# Patient Record
Sex: Female | Born: 1955
Health system: Southern US, Community
[De-identification: ages and names within clinical notes are randomized; demographics above are authoritative.]

## PROBLEM LIST (undated history)

## (undated) DIAGNOSIS — R51 Headache: Secondary | ICD-10-CM

## (undated) DIAGNOSIS — K219 Gastro-esophageal reflux disease without esophagitis: Secondary | ICD-10-CM

## (undated) DIAGNOSIS — G43909 Migraine, unspecified, not intractable, without status migrainosus: Secondary | ICD-10-CM

## (undated) DIAGNOSIS — E119 Type 2 diabetes mellitus without complications: Secondary | ICD-10-CM

## (undated) DIAGNOSIS — G8929 Other chronic pain: Secondary | ICD-10-CM

## (undated) DIAGNOSIS — Z8679 Personal history of other diseases of the circulatory system: Secondary | ICD-10-CM

## (undated) DIAGNOSIS — R519 Headache, unspecified: Secondary | ICD-10-CM

## (undated) DIAGNOSIS — R011 Cardiac murmur, unspecified: Secondary | ICD-10-CM

## (undated) DIAGNOSIS — F329 Major depressive disorder, single episode, unspecified: Secondary | ICD-10-CM

## (undated) DIAGNOSIS — J309 Allergic rhinitis, unspecified: Secondary | ICD-10-CM

## (undated) DIAGNOSIS — K5792 Diverticulitis of intestine, part unspecified, without perforation or abscess without bleeding: Secondary | ICD-10-CM

## (undated) DIAGNOSIS — Z5189 Encounter for other specified aftercare: Secondary | ICD-10-CM

## (undated) DIAGNOSIS — IMO0001 Reserved for inherently not codable concepts without codable children: Secondary | ICD-10-CM

## (undated) DIAGNOSIS — F32A Depression, unspecified: Secondary | ICD-10-CM

## (undated) DIAGNOSIS — N921 Excessive and frequent menstruation with irregular cycle: Secondary | ICD-10-CM

## (undated) DIAGNOSIS — R7303 Prediabetes: Secondary | ICD-10-CM

## (undated) HISTORY — DX: Other chronic pain: G89.29

## (undated) HISTORY — DX: Allergic rhinitis, unspecified: J30.9

## (undated) HISTORY — PX: ESOPHAGOGASTRODUODENOSCOPY: SHX1529

## (undated) HISTORY — DX: Gastro-esophageal reflux disease without esophagitis: K21.9

## (undated) HISTORY — DX: Type 2 diabetes mellitus without complications: E11.9

## (undated) HISTORY — DX: Diverticulitis of intestine, part unspecified, without perforation or abscess without bleeding: K57.92

## (undated) HISTORY — DX: Major depressive disorder, single episode, unspecified: F32.9

## (undated) HISTORY — PX: COLONOSCOPY: SHX174

## (undated) HISTORY — PX: BREAST BIOPSY: SHX20

## (undated) HISTORY — DX: Depression, unspecified: F32.A

## (undated) HISTORY — PX: TONSILLECTOMY: SHX5217

## (undated) HISTORY — DX: Cardiac murmur, unspecified: R01.1

## (undated) HISTORY — DX: Excessive and frequent menstruation with irregular cycle: N92.1

## (undated) HISTORY — DX: Migraine, unspecified, not intractable, without status migrainosus: G43.909

## (undated) HISTORY — DX: Headache, unspecified: R51.9

## (undated) HISTORY — PX: DILATION AND CURETTAGE OF UTERUS: SHX78

## (undated) HISTORY — DX: Prediabetes: R73.03

## (undated) HISTORY — DX: Headache: R51

---

## 1984-07-24 HISTORY — PX: CERCLAGE REMOVAL: SUR1451

## 2010-11-21 ENCOUNTER — Encounter: Payer: Self-pay | Admitting: Internal Medicine

## 2010-11-21 ENCOUNTER — Ambulatory Visit (INDEPENDENT_AMBULATORY_CARE_PROVIDER_SITE_OTHER): Payer: 59 | Admitting: Internal Medicine

## 2010-11-21 DIAGNOSIS — K219 Gastro-esophageal reflux disease without esophagitis: Secondary | ICD-10-CM

## 2010-11-21 DIAGNOSIS — F3289 Other specified depressive episodes: Secondary | ICD-10-CM

## 2010-11-21 DIAGNOSIS — F32A Depression, unspecified: Secondary | ICD-10-CM | POA: Insufficient documentation

## 2010-11-21 DIAGNOSIS — F329 Major depressive disorder, single episode, unspecified: Secondary | ICD-10-CM | POA: Insufficient documentation

## 2010-11-21 DIAGNOSIS — N920 Excessive and frequent menstruation with regular cycle: Secondary | ICD-10-CM

## 2010-11-21 DIAGNOSIS — L301 Dyshidrosis [pompholyx]: Secondary | ICD-10-CM | POA: Insufficient documentation

## 2010-11-21 DIAGNOSIS — N921 Excessive and frequent menstruation with irregular cycle: Secondary | ICD-10-CM

## 2010-11-21 HISTORY — DX: Excessive and frequent menstruation with irregular cycle: N92.1

## 2010-11-21 MED ORDER — PANTOPRAZOLE SODIUM 40 MG PO TBEC: 40.0000 mg | DELAYED_RELEASE_TABLET | Freq: Every day | ORAL | Status: DC

## 2010-11-21 MED ORDER — DULOXETINE HCL 60 MG PO CPEP: 60.0000 mg | ORAL_CAPSULE | Freq: Every day | ORAL | Status: DC

## 2010-11-21 NOTE — Assessment & Plan Note (Signed)
Mild symptoms - advised OTC steroid cream, to call if rx desired or needed Educated on dx and tx today

## 2010-11-21 NOTE — Assessment & Plan Note (Signed)
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

## 2010-11-21 NOTE — Assessment & Plan Note (Signed)
Well controlled on daily PPI - cont same - refill done Reports EGD and colo 2009 - will send for records to have on file

## 2010-11-21 NOTE — Assessment & Plan Note (Signed)
Well controlled since 2009 on current therapy (cymbalta) - also help control Mskel arthritis pains The current medical regimen is effective;  continue present plan and medications.

## 2010-11-21 NOTE — Progress Notes (Signed)
  Subjective:    Patient ID: Sarah Mullen, female    DOB: April 06, 1956, 55 y.o.   MRN: [id number]  HPI  New pt to me and our practice, here to est care - recent move to [place] from [facility] winter 2012 Reviewed chronic medical issues today: GERD -the patient reports compliance with medication(s) as prescribed. Denies adverse side effects. Controlled symptoms on daily ppi Depression-  Severe in 2009 - controlled with cymbalta since that time - denies si/hi - denies adverse med se Menopausal symptoms and bleeding - working with gyn on same - recent hormone dose increase, eval for enlarged uterus without fibroids with serial [place] planned.   Past Medical History  Diagnosis Date  . Depression   . Diverticulitis   . Chronic headaches   . GERD (gastroesophageal reflux disease)   . Heart murmur   . Migraines   . Allergy    Family History  Problem Relation Age of Onset  . Arthritis Mother   . Hyperlipidemia Mother   . Heart disease Mother   . Arthritis Father   . Lung cancer Father   . Heart disease Father   . Hypertension Father   . Diabetes Father   . Colon cancer Paternal Aunt   . Kidney disease Paternal Grandmother   . Kidney disease Paternal Grandfather      Review of Systems  Respiratory: Negative for cough.   Cardiovascular: Negative for chest pain.  Musculoskeletal: Negative for back pain.  Neurological: Negative for headaches.  Skin: complains of peeling and itch on soles of feet, not improved with OTC lotions. No other specific complaints in a complete review of systems (except as listed in HPI above).     Objective:   Physical Exam BP 118/72  Pulse 113  Temp(Src) 98.7 F (37.1 C) (Oral)  Ht 5' (1.524 m)  Wt 162 lb (73.483 kg)  BMI 31.64 kg/m2  SpO2 96% Physical Exam  Constitutional: She is oriented to person, place, and time. She appears well-developed and well-nourished. No distress.  HENT: Head: Normocephalic and atraumatic. Nose: Nose normal. Ears: B TMS clear, no  erythema Mouth/Throat: Oropharynx is clear and moist. No oropharyngeal exudate.  Eyes: Conjunctivae and EOM are normal. Pupils are equal, round, and reactive to light. No scleral icterus.  Neck: Normal range of motion. Neck supple. No JVD present. No thyromegaly present.  Cardiovascular: Normal rate, regular rhythm and normal heart sounds.  No murmur heard. Pulmonary/Chest: Effort normal and breath sounds normal. No respiratory distress. She has no wheezes.  Abdominal: Soft. Bowel sounds are normal. She exhibits no distension. There is no tenderness.  Musculoskeletal: Normal range of motion. She exhibits no edema.  Neurological: She is alert and oriented to person, place, and time. No cranial nerve deficit. Coordination normal.  Skin: Skin is warm and dry. No rash noted. No erythema. dyshidrotic eczema changes on palms and soles - onchomycosis right great toenail medial edge Psychiatric: She has a normal mood and affect. Her behavior is normal. Judgment and thought content normal.  GU - defer to gyn (s miller)       Assessment & Plan:  See problem list. Medications and labs reviewed today. Time spent with pt 30 minutes, greater than 50% time spent counseling patient on depression, eczema and toenail fungus and medication review. Also plans to obtain and review prior records from [facility] providers

## 2010-11-21 NOTE — Patient Instructions (Signed)
It was good to see you today. Medications reviewed, no changes at this time. Try OTC hydrocortisone for peeling and itch on hands on soles: "dyshydrotic eczema" - let us know if you need prescription steroid for this Refill on medication(s) as discussed today. Will send for records from [facility] to review Please schedule followup in July/August for physical and labs, call sooner if problems.

## 2011-02-06 ENCOUNTER — Other Ambulatory Visit: Payer: 59

## 2011-02-06 ENCOUNTER — Other Ambulatory Visit: Payer: Self-pay | Admitting: Internal Medicine

## 2011-02-06 DIAGNOSIS — Z0000 Encounter for general adult medical examination without abnormal findings: Secondary | ICD-10-CM

## 2011-02-13 ENCOUNTER — Encounter: Payer: 59 | Admitting: Internal Medicine

## 2011-02-13 ENCOUNTER — Other Ambulatory Visit (INDEPENDENT_AMBULATORY_CARE_PROVIDER_SITE_OTHER): Payer: 59 | Admitting: Internal Medicine

## 2011-02-13 ENCOUNTER — Other Ambulatory Visit (INDEPENDENT_AMBULATORY_CARE_PROVIDER_SITE_OTHER): Payer: 59

## 2011-02-13 DIAGNOSIS — Z0000 Encounter for general adult medical examination without abnormal findings: Secondary | ICD-10-CM

## 2011-02-13 LAB — CBC WITH DIFFERENTIAL/PLATELET
Basophils Relative: 0.6 % (ref 0.0–3.0)
Eosinophils Relative: 5.5 % — ABNORMAL HIGH (ref 0.0–5.0)
HCT: 34.3 % — ABNORMAL LOW (ref 36.0–46.0)
Hemoglobin: 11.3 g/dL — ABNORMAL LOW (ref 12.0–15.0)
Lymphs Abs: 3.1 10*3/uL (ref 0.7–4.0)
Monocytes Relative: 5.8 % (ref 3.0–12.0)
Platelets: 375 10*3/uL (ref 150.0–400.0)
RBC: 4.2 Mil/uL (ref 3.87–5.11)
WBC: 9.7 10*3/uL (ref 4.5–10.5)

## 2011-02-13 LAB — URINALYSIS
Bilirubin Urine: NEGATIVE
Nitrite: NEGATIVE
Total Protein, Urine: NEGATIVE
Urine Glucose: NEGATIVE
pH: 6 (ref 5.0–8.0)

## 2011-02-13 LAB — HEPATIC FUNCTION PANEL
ALT: 27 U/L (ref 0–35)
Albumin: 4.1 g/dL (ref 3.5–5.2)
Total Protein: 7.1 g/dL (ref 6.0–8.3)

## 2011-02-13 LAB — LIPID PANEL
Cholesterol: 172 mg/dL (ref 0–200)
HDL: 49.3 mg/dL (ref >39.00)
LDL Cholesterol: 92 mg/dL (ref 0–99)
Triglycerides: 152 mg/dL — ABNORMAL HIGH (ref 0.0–149.0)
VLDL: 30.4 mg/dL (ref 0.0–40.0)

## 2011-02-13 LAB — BASIC METABOLIC PANEL
BUN: 29 mg/dL — ABNORMAL HIGH (ref 6–23)
Chloride: 102 mEq/L (ref 96–112)
Potassium: 4.1 mEq/L (ref 3.5–5.1)
Sodium: 135 mEq/L (ref 135–145)

## 2011-02-13 LAB — TSH: TSH: 2.54 u[IU]/mL (ref 0.35–5.50)

## 2011-02-17 ENCOUNTER — Telehealth: Payer: Self-pay

## 2011-02-17 MED ORDER — CIPROFLOXACIN HCL 500 MG PO TABS: 500.0000 mg | ORAL_TABLET | Freq: Two times a day (BID) | ORAL | Status: DC

## 2011-02-17 NOTE — Telephone Encounter (Signed)
Pt called stating she is having sxs of UTI, dysuria and frequency. Pt has appt with VAL Monday but is requesting ABX to start treatment as soon as possible, please advise.

## 2011-02-17 NOTE — Telephone Encounter (Signed)
UA 7/23 cpx labs did not show UTI at that time- but given symptoms, will send erx for 3 d cipro - thanks

## 2011-02-17 NOTE — Telephone Encounter (Signed)
Pt informed via VM of Rx/pharmacy

## 2011-02-20 ENCOUNTER — Encounter: Payer: Self-pay | Admitting: Internal Medicine

## 2011-02-20 ENCOUNTER — Ambulatory Visit (INDEPENDENT_AMBULATORY_CARE_PROVIDER_SITE_OTHER): Payer: 59 | Admitting: Internal Medicine

## 2011-02-20 VITALS — BP 110/80 | HR 90 | Temp 98.3°F | Ht 60.0 in | Wt 165.0 lb

## 2011-02-20 DIAGNOSIS — Z23 Encounter for immunization: Secondary | ICD-10-CM

## 2011-02-20 DIAGNOSIS — Z0000 Encounter for general adult medical examination without abnormal findings: Secondary | ICD-10-CM

## 2011-02-20 NOTE — Patient Instructions (Signed)
It was good to see you today. Exam, labs and EKG today look good - continue to work on diet/exercise for weight loss as discussed Medications reviewed, no changes at this time. Tdap booster done today Take OTC iron sulfate 325 daily to help with heavy cycles and anemia - work with Dr. Hyacinth Meeker on same as you are doing Please schedule followup in 12 months for annual physical and labs, call sooner if problems.

## 2011-02-20 NOTE — Progress Notes (Signed)
Subjective:    Patient ID: Sarah Mullen, female    DOB: [birth year], 55 y.o.   MRN: [id number]  HPI patient is here today for annual physical. Patient feels well and has no complaints.  Past Medical History  Diagnosis Date  . Depression   . Diverticulitis   . Chronic headaches   . GERD (gastroesophageal reflux disease)   . Heart murmur   . Migraines   . Allergic rhinitis, cause unspecified    Family History  Problem Relation Age of Onset  . Arthritis Mother   . Hyperlipidemia Mother   . Heart disease Mother   . Arthritis Father   . Lung cancer Father   . Heart disease Father   . Hypertension Father   . Diabetes Father   . Colon cancer Paternal Aunt   . Kidney disease Paternal Grandmother   . Kidney disease Paternal Grandfather    History  Substance Use Topics  . Smoking status: Former Smoker    Types: Cigarettes  . Smokeless tobacco: Not on file  . Alcohol Use: No    Review of Systems  Constitutional: Negative for fever.  Respiratory: Negative for cough and shortness of breath.   Cardiovascular: Negative for chest pain.  Gastrointestinal: Negative for abdominal pain.  Musculoskeletal: Negative for gait problem.  Skin: Negative for rash.  Neurological: Negative for dizziness.  No other specific complaints in a complete review of systems (except as listed in HPI above).     Objective:   Physical Exam BP 110/80  Pulse 90  Temp(Src) 98.3 F (36.8 C) (Oral)  Ht 5' (1.524 m)  Wt 165 lb (74.844 kg)  BMI 32.22 kg/m2  SpO2 97% Wt Readings from Last 3 Encounters:  02/20/11 165 lb (74.844 kg)  11/21/10 162 lb (73.483 kg)   Constitutional: She is oriented to person, place, and time. She appears well-developed and well-nourished. No distress.  HENT: Head: Normocephalic and atraumatic. Ears; B TMs ok, no erythema or effusion; Nose: Nose normal.  Mouth/Throat: Oropharynx is clear and moist. No oropharyngeal exudate.  Eyes: Conjunctivae and EOM are normal. Pupils are  equal, round, and reactive to light. No scleral icterus.  Neck: Normal range of motion. Neck supple. No JVD present. No thyromegaly present.  Cardiovascular: Normal rate, regular rhythm and normal heart sounds.  No murmur heard. No BLE edema. Pulmonary/Chest: Effort normal and breath sounds normal. No respiratory distress. She has no wheezes.  Abdominal: Soft. Bowel sounds are normal. She exhibits no distension. There is no tenderness.  Musculoskeletal: Normal range of motion, no joint effusions. No gross deformities Neurological: She is alert and oriented to person, place, and time. No cranial nerve deficit. Coordination normal.  Skin: Skin is warm and dry. No rash noted. No erythema.  Psychiatric: She has a normal mood and affect. Her behavior is normal. Judgment and thought content normal.   Lab Results  Component Value Date   WBC 9.7 02/13/2011   HGB 11.3* 02/13/2011   HCT 34.3* 02/13/2011   PLT 375.0 02/13/2011   CHOL 172 02/13/2011   TRIG 152.0* 02/13/2011   HDL 49.30 02/13/2011   ALT 27 02/13/2011   AST 21 02/13/2011   NA 135 02/13/2011   K 4.1 02/13/2011   CL 102 02/13/2011   CREATININE 0.7 02/13/2011   BUN 29* 02/13/2011   CO2 27 02/13/2011   TSH 2.54 02/13/2011   EKG - NSR @ 92bpm - no ischemic changes, no arrythmias     Assessment & Plan:   CPX -  v70.0 - Patient has been counseled on age-appropriate routine health concerns for screening and prevention. These are reviewed and up-to-date. Immunizations are up-to-date or declined. Labs and ECG reviewed.

## 2011-03-22 DIAGNOSIS — N852 Hypertrophy of uterus: Secondary | ICD-10-CM

## 2011-03-22 DIAGNOSIS — N950 Postmenopausal bleeding: Secondary | ICD-10-CM

## 2011-03-22 DIAGNOSIS — R102 Pelvic and perineal pain unspecified side: Secondary | ICD-10-CM

## 2011-03-22 DIAGNOSIS — D259 Leiomyoma of uterus, unspecified: Secondary | ICD-10-CM

## 2011-04-05 ENCOUNTER — Other Ambulatory Visit (HOSPITAL_COMMUNITY): Payer: 59

## 2011-04-11 ENCOUNTER — Ambulatory Visit (HOSPITAL_COMMUNITY): Admission: RE | Admit: 2011-04-11 | Payer: 59 | Source: Ambulatory Visit | Admitting: Obstetrics & Gynecology

## 2011-04-11 ENCOUNTER — Encounter (HOSPITAL_COMMUNITY): Admission: RE | Payer: Self-pay | Source: Ambulatory Visit

## 2011-04-11 SURGERY — ROBOTIC ASSISTED TOTAL HYSTERECTOMY
Anesthesia: General

## 2011-09-22 HISTORY — PX: ABDOMINAL HYSTERECTOMY: SHX81

## 2011-09-28 ENCOUNTER — Encounter (HOSPITAL_COMMUNITY): Payer: Self-pay

## 2011-10-10 ENCOUNTER — Encounter (HOSPITAL_COMMUNITY): Payer: Self-pay

## 2011-10-10 ENCOUNTER — Encounter (HOSPITAL_COMMUNITY)
Admission: RE | Admit: 2011-10-10 | Discharge: 2011-10-10 | Disposition: A | Discharge status: Home / Self Care | Payer: 59 | Source: Ambulatory Visit | Attending: Obstetrics & Gynecology | Admitting: Obstetrics & Gynecology

## 2011-10-10 HISTORY — DX: Encounter for other specified aftercare: Z51.89

## 2011-10-10 HISTORY — DX: Personal history of other diseases of the circulatory system: Z86.79

## 2011-10-10 HISTORY — DX: Reserved for inherently not codable concepts without codable children: IMO0001

## 2011-10-10 LAB — CBC
MCH: 24.1 pg — ABNORMAL LOW (ref 26.0–34.0)
MCV: 80.3 fL (ref 78.0–100.0)
Platelets: 418 10*3/uL — ABNORMAL HIGH (ref 150–400)
RDW: 15.5 % (ref 11.5–15.5)
WBC: 8.3 10*3/uL (ref 4.0–10.5)

## 2011-10-10 MED ORDER — METRONIDAZOLE IN NACL 5-0.79 MG/ML-% IV SOLN: 500.0000 mg | INTRAVENOUS | Status: DC

## 2011-10-10 MED ORDER — CIPROFLOXACIN IN D5W 400 MG/200ML IV SOLN: 400.0000 mg | INTRAVENOUS | Status: DC

## 2011-10-10 NOTE — Patient Instructions (Addendum)
   Your procedure is scheduled on: Tuesday March 26th  Enter through the Main Entrance of [facility] at: [employer] up the phone at the desk and dial [phone] and inform us of your arrival.  Please call this number if you have any problems the morning of surgery: [phone]  Remember: Do not eat food after midnight: Monday Do not drink clear liquids after: midnight Monday Take these medicines the morning of surgery with a SIP OF WATER: none  Do not wear jewelry, make-up, or FINGER nail polish Do not wear lotions, powders, perfumes or deodorant. Do not shave 48 hours prior to surgery. Do not bring valuables to the hospital. Contacts, dentures or bridgework may not be worn into surgery.  Leave suitcase in the car. After Surgery it may be brought to your room. For patients being admitted to the hospital, checkout time is 11:00am the day of discharge.  Patients discharged on the day of surgery will not be allowed to drive home.     Remember to use your hibiclens as instructed.Please shower with 1/2 bottle the evening before your surgery and the other 1/2 bottle the morning of surgery. Neck down avoiding private area.

## 2011-10-16 MED ORDER — METRONIDAZOLE IN NACL 5-0.79 MG/ML-% IV SOLN
500.0000 mg | INTRAVENOUS | Status: DC
  Filled 2011-10-16: qty 100

## 2011-10-16 MED ORDER — CIPROFLOXACIN IN D5W 400 MG/200ML IV SOLN
400.0000 mg | INTRAVENOUS | Status: DC
  Filled 2011-10-16: qty 200

## 2011-10-16 NOTE — H&P (Signed)
Sarah Mullen is an 56 [id number] female with enlarged uterus, irregular and heavy bleeding on HRT, and uterine fibroids.  Patient has been evaluated with PUS and endometrial biopsy.  Uterus is 14 x 8 x 5cm.  She has been counseled about treatment options and has opted for definitive management.  Risks, benefits, alternatives have all been discussed with her and her significant other.  We have discussed alternative surgical options and patient is desirous of minimally invasive option.  She does understand I may need to convert to an open procedure and is comfortable with this plan.  Pertinent Gynecological History: Menses: irregular occurring approximately every 24-28 days with spotting approximately 7-14 days per month Bleeding: post menopausal bleeding Contraception: none DES exposure: denies Blood transfusions: none Sexually transmitted diseases: no past history Previous GYN Procedures: cerclage  Last mammogram: normal Date: 12/11 Last pap: normal Date: 12/11 OB History: G7, P0251   Menstrual History: Menarche age: [age] No LMP recorded.    Past Medical History  Diagnosis Date  . Depression   . Diverticulitis   . Chronic headaches   . GERD (gastroesophageal reflux disease)   . Migraines   . Allergic rhinitis, cause unspecified   . Blood transfusion     iron transfusion- [place]-2009  . Heart murmur     copy of echo on chart  . History of sinus tachycardia     copy of holter monitor results on chart    Past Surgical History  Procedure Date  . Tonsillectomy   . Breast biopsy   . Cesarean section   . Cerclage removal   . Dilation and curettage of uterus     Family History  Problem Relation Age of Onset  . Arthritis Mother   . Hyperlipidemia Mother   . Heart disease Mother   . Arthritis Father   . Lung cancer Father   . Heart disease Father   . Hypertension Father   . Diabetes Father   . Colon cancer Paternal Aunt   . Kidney disease Paternal Grandmother   .  Kidney disease Paternal Grandfather     Social History:  reports that she quit smoking about 10 years ago. Her smoking use included Cigarettes. She does not have any smokeless tobacco history on file. She reports that she does not drink alcohol or use illicit drugs.  Allergies:  Allergies  Allergen Reactions  . Food Itching    Kiwi-throat itching  . Contrast Media (Iodinated Diagnostic Agents) Rash  . Keflex (Cephalexin) Rash    No prescriptions prior to admission    Review of Systems  Constitutional: Negative for fever and chills.  Respiratory: Negative for cough.   Cardiovascular: Negative for chest pain and palpitations.  Gastrointestinal: Negative for heartburn and nausea.  Genitourinary: Negative for dysuria, urgency and frequency.  Neurological: Negative for dizziness and headaches.  Endo/Heme/Allergies: Does not bruise/bleed easily.    Weight 73.483 kg (162 lb). Physical Exam  Constitutional: She is oriented to person, place, and time. She appears well-developed.  HENT:  Head: Normocephalic.  Neck: Normal range of motion. Neck supple. No thyromegaly present.  Cardiovascular: Normal rate, regular rhythm and normal heart sounds.   Respiratory: Effort normal and breath sounds normal.  GI: Soft. Bowel sounds are normal. She exhibits no distension. There is no tenderness.  Neurological: She is alert and oriented to person, place, and time.  Skin: Skin is warm and dry.  Psychiatric: She has a normal mood and affect.    No results found for  this or any previous visit (from the past 24 hour(s)).  No results found.  Assessment/Plan: 56 year old DWF with heavy bleeding on HRT, enlarged uterus, fibroids here for definitive management via total laparoscopic hysterectomy and bilateral salpingoophorectomy.  Risks, benefits, alternatives have been explained and are all documented in my office chart.  Patient and I discussed procedure. All questions answered.  Herny Scurlock,M  SUZANNE 10/16/2011, 10:11 PM

## 2011-10-17 ENCOUNTER — Encounter (HOSPITAL_COMMUNITY)
Admission: RE | Disposition: A | Discharge status: Home / Self Care | Payer: Self-pay | Source: Ambulatory Visit | Attending: Obstetrics & Gynecology

## 2011-10-17 ENCOUNTER — Encounter (HOSPITAL_COMMUNITY): Payer: Self-pay | Admitting: *Deleted

## 2011-10-17 ENCOUNTER — Ambulatory Visit (HOSPITAL_COMMUNITY)
Admission: RE | Admit: 2011-10-17 | Discharge: 2011-10-18 | Disposition: A | Discharge status: Home / Self Care | Payer: 59 | Source: Ambulatory Visit | Attending: Obstetrics & Gynecology | Admitting: Obstetrics & Gynecology

## 2011-10-17 ENCOUNTER — Encounter (HOSPITAL_COMMUNITY): Payer: Self-pay | Admitting: Anesthesiology

## 2011-10-17 ENCOUNTER — Ambulatory Visit (HOSPITAL_COMMUNITY): Payer: 59 | Admitting: Anesthesiology

## 2011-10-17 DIAGNOSIS — Z01812 Encounter for preprocedural laboratory examination: Secondary | ICD-10-CM | POA: Insufficient documentation

## 2011-10-17 DIAGNOSIS — D649 Anemia, unspecified: Secondary | ICD-10-CM | POA: Insufficient documentation

## 2011-10-17 DIAGNOSIS — N852 Hypertrophy of uterus: Secondary | ICD-10-CM | POA: Insufficient documentation

## 2011-10-17 DIAGNOSIS — N920 Excessive and frequent menstruation with regular cycle: Secondary | ICD-10-CM | POA: Insufficient documentation

## 2011-10-17 DIAGNOSIS — N8000 Endometriosis of the uterus, unspecified: Secondary | ICD-10-CM | POA: Insufficient documentation

## 2011-10-17 DIAGNOSIS — Z01818 Encounter for other preprocedural examination: Secondary | ICD-10-CM | POA: Insufficient documentation

## 2011-10-17 DIAGNOSIS — D252 Subserosal leiomyoma of uterus: Secondary | ICD-10-CM | POA: Insufficient documentation

## 2011-10-17 DIAGNOSIS — D251 Intramural leiomyoma of uterus: Secondary | ICD-10-CM | POA: Insufficient documentation

## 2011-10-17 HISTORY — PX: CYSTOSCOPY: SHX5120

## 2011-10-17 LAB — HEMOGLOBIN: Hemoglobin: 9.9 g/dL — ABNORMAL LOW (ref 12.0–15.0)

## 2011-10-17 LAB — PREGNANCY, URINE: Preg Test, Ur: NEGATIVE

## 2011-10-17 SURGERY — ROBOTIC ASSISTED TOTAL HYSTERECTOMY
Anesthesia: General | Site: Urethra | Wound class: Clean Contaminated

## 2011-10-17 MED ORDER — ROPIVACAINE HCL 5 MG/ML IJ SOLN
INTRAMUSCULAR | Status: AC
  Filled 2011-10-17: qty 30

## 2011-10-17 MED ORDER — FENTANYL CITRATE 0.05 MG/ML IJ SOLN
INTRAMUSCULAR | Status: AC
  Filled 2011-10-17: qty 2

## 2011-10-17 MED ORDER — MIDAZOLAM HCL 5 MG/5ML IJ SOLN
INTRAMUSCULAR | Status: DC | PRN
  Administered 2011-10-17: 2 mg via INTRAVENOUS

## 2011-10-17 MED ORDER — MORPHINE SULFATE 4 MG/ML IJ SOLN: 1.0000 mg | INTRAMUSCULAR | Status: DC | PRN

## 2011-10-17 MED ORDER — SCOPOLAMINE 1 MG/3DAYS TD PT72
MEDICATED_PATCH | TRANSDERMAL | Status: AC
  Administered 2011-10-17: 1.5 mg via TRANSDERMAL
  Filled 2011-10-17: qty 1

## 2011-10-17 MED ORDER — PROPOFOL 10 MG/ML IV EMUL
INTRAVENOUS | Status: AC
  Filled 2011-10-17: qty 20

## 2011-10-17 MED ORDER — KETOROLAC TROMETHAMINE 30 MG/ML IJ SOLN
INTRAMUSCULAR | Status: AC
  Filled 2011-10-17: qty 1

## 2011-10-17 MED ORDER — ACETAMINOPHEN 325 MG PO TABS: 325.0000 mg | ORAL_TABLET | ORAL | Status: DC | PRN

## 2011-10-17 MED ORDER — INDIGOTINDISULFONATE SODIUM 8 MG/ML IJ SOLN
INTRAMUSCULAR | Status: DC | PRN
  Administered 2011-10-17: 5 mL via INTRAVENOUS

## 2011-10-17 MED ORDER — NEOSTIGMINE METHYLSULFATE 1 MG/ML IJ SOLN
INTRAMUSCULAR | Status: DC | PRN
  Administered 2011-10-17: 5 mg via INTRAVENOUS

## 2011-10-17 MED ORDER — TEMAZEPAM 15 MG PO CAPS: 15.0000 mg | ORAL_CAPSULE | Freq: Every evening | ORAL | Status: DC | PRN

## 2011-10-17 MED ORDER — LIDOCAINE HCL (CARDIAC) 20 MG/ML IV SOLN
INTRAVENOUS | Status: AC
  Filled 2011-10-17: qty 5

## 2011-10-17 MED ORDER — ONDANSETRON HCL 4 MG/2ML IJ SOLN
INTRAMUSCULAR | Status: DC | PRN
  Administered 2011-10-17: 4 mg via INTRAVENOUS

## 2011-10-17 MED ORDER — FENTANYL CITRATE 0.05 MG/ML IJ SOLN
25.0000 ug | INTRAMUSCULAR | Status: DC | PRN
  Administered 2011-10-17 (×2): 50 ug via INTRAVENOUS

## 2011-10-17 MED ORDER — PROMETHAZINE HCL 25 MG/ML IJ SOLN: 6.2500 mg | INTRAMUSCULAR | Status: DC | PRN

## 2011-10-17 MED ORDER — MEPERIDINE HCL 25 MG/ML IJ SOLN: 6.2500 mg | INTRAMUSCULAR | Status: DC | PRN

## 2011-10-17 MED ORDER — MIDAZOLAM HCL 2 MG/2ML IJ SOLN
INTRAMUSCULAR | Status: AC
  Filled 2011-10-17: qty 2

## 2011-10-17 MED ORDER — ACETAMINOPHEN 10 MG/ML IV SOLN
1000.0000 mg | Freq: Once | INTRAVENOUS | Status: AC
  Administered 2011-10-17: 1000 mg via INTRAVENOUS
  Filled 2011-10-17: qty 100

## 2011-10-17 MED ORDER — LACTATED RINGERS IV SOLN
INTRAVENOUS | Status: DC
  Administered 2011-10-17 (×3): via INTRAVENOUS

## 2011-10-17 MED ORDER — MIDAZOLAM HCL 2 MG/2ML IJ SOLN: 0.5000 mg | Freq: Once | INTRAMUSCULAR | Status: DC | PRN

## 2011-10-17 MED ORDER — LACTATED RINGERS IR SOLN
Status: DC | PRN
  Administered 2011-10-17: 3000 mL

## 2011-10-17 MED ORDER — FENTANYL CITRATE 0.05 MG/ML IJ SOLN
INTRAMUSCULAR | Status: AC
  Filled 2011-10-17: qty 5

## 2011-10-17 MED ORDER — OXYCODONE-ACETAMINOPHEN 5-325 MG PO TABS
1.0000 | ORAL_TABLET | ORAL | Status: DC | PRN
  Administered 2011-10-18 (×2): 1 via ORAL
  Filled 2011-10-17 (×3): qty 1

## 2011-10-17 MED ORDER — ROCURONIUM BROMIDE 100 MG/10ML IV SOLN
INTRAVENOUS | Status: DC | PRN
  Administered 2011-10-17: 20 mg via INTRAVENOUS
  Administered 2011-10-17: 60 mg via INTRAVENOUS
  Administered 2011-10-17: 10 mg via INTRAVENOUS

## 2011-10-17 MED ORDER — KETOROLAC TROMETHAMINE 30 MG/ML IJ SOLN
INTRAMUSCULAR | Status: DC | PRN
  Administered 2011-10-17: 30 mg via INTRAVENOUS

## 2011-10-17 MED ORDER — PROPOFOL 10 MG/ML IV EMUL
INTRAVENOUS | Status: DC | PRN
  Administered 2011-10-17: 170 mg via INTRAVENOUS

## 2011-10-17 MED ORDER — INDIGOTINDISULFONATE SODIUM 8 MG/ML IJ SOLN
INTRAMUSCULAR | Status: AC
  Filled 2011-10-17: qty 5

## 2011-10-17 MED ORDER — FENTANYL CITRATE 0.05 MG/ML IJ SOLN
INTRAMUSCULAR | Status: DC | PRN
  Administered 2011-10-17: 50 ug via INTRAVENOUS
  Administered 2011-10-17: 150 ug via INTRAVENOUS
  Administered 2011-10-17 (×3): 50 ug via INTRAVENOUS

## 2011-10-17 MED ORDER — PANTOPRAZOLE SODIUM 40 MG PO TBEC
DELAYED_RELEASE_TABLET | ORAL | Status: AC
  Administered 2011-10-17: 40 mg via ORAL
  Filled 2011-10-17: qty 1

## 2011-10-17 MED ORDER — MENTHOL 3 MG MT LOZG: 1.0000 | LOZENGE | OROMUCOSAL | Status: DC | PRN

## 2011-10-17 MED ORDER — SCOPOLAMINE 1 MG/3DAYS TD PT72
1.0000 | MEDICATED_PATCH | Freq: Once | TRANSDERMAL | Status: DC
  Administered 2011-10-17: 1.5 mg via TRANSDERMAL

## 2011-10-17 MED ORDER — ONDANSETRON HCL 4 MG/2ML IJ SOLN
INTRAMUSCULAR | Status: AC
  Filled 2011-10-17: qty 2

## 2011-10-17 MED ORDER — NEOSTIGMINE METHYLSULFATE 1 MG/ML IJ SOLN
INTRAMUSCULAR | Status: AC
  Filled 2011-10-17: qty 10

## 2011-10-17 MED ORDER — DEXTROSE-NACL 5-0.45 % IV SOLN: INTRAVENOUS | Status: DC

## 2011-10-17 MED ORDER — DEXAMETHASONE SODIUM PHOSPHATE 10 MG/ML IJ SOLN
INTRAMUSCULAR | Status: AC
  Filled 2011-10-17: qty 1

## 2011-10-17 MED ORDER — PANTOPRAZOLE SODIUM 40 MG IV SOLR
40.0000 mg | Freq: Every day | INTRAVENOUS | Status: DC
  Administered 2011-10-17: 40 mg via INTRAVENOUS
  Filled 2011-10-17 (×2): qty 40

## 2011-10-17 MED ORDER — DEXAMETHASONE SODIUM PHOSPHATE 10 MG/ML IJ SOLN
INTRAMUSCULAR | Status: DC | PRN
  Administered 2011-10-17: 10 mg via INTRAVENOUS

## 2011-10-17 MED ORDER — GLYCOPYRROLATE 0.2 MG/ML IJ SOLN
INTRAMUSCULAR | Status: AC
  Filled 2011-10-17: qty 2

## 2011-10-17 MED ORDER — GLYCOPYRROLATE 0.2 MG/ML IJ SOLN
INTRAMUSCULAR | Status: DC | PRN
  Administered 2011-10-17: .8 mg via INTRAVENOUS

## 2011-10-17 MED ORDER — SODIUM CHLORIDE BACTERIOSTATIC 0.9 % IJ SOLN
INTRAMUSCULAR | Status: DC | PRN
  Administered 2011-10-17: 60 mL via INTRAMUSCULAR

## 2011-10-17 MED ORDER — KETOROLAC TROMETHAMINE 30 MG/ML IJ SOLN: 30.0000 mg | Freq: Four times a day (QID) | INTRAMUSCULAR | Status: DC

## 2011-10-17 MED ORDER — ROCURONIUM BROMIDE 50 MG/5ML IV SOLN
INTRAVENOUS | Status: AC
  Filled 2011-10-17: qty 2

## 2011-10-17 MED ORDER — CIPROFLOXACIN IN D5W 400 MG/200ML IV SOLN
INTRAVENOUS | Status: DC | PRN
  Administered 2011-10-17: 400 mg via INTRAVENOUS

## 2011-10-17 MED ORDER — METRONIDAZOLE IN NACL 5-0.79 MG/ML-% IV SOLN
INTRAVENOUS | Status: DC | PRN
  Administered 2011-10-17: 500 mg via INTRAVENOUS

## 2011-10-17 MED ORDER — ROPIVACAINE HCL 5 MG/ML IJ SOLN
INTRAMUSCULAR | Status: DC | PRN
  Administered 2011-10-17: 60 mL via EPIDURAL

## 2011-10-17 MED ORDER — HYDROMORPHONE HCL PF 1 MG/ML IJ SOLN
INTRAMUSCULAR | Status: DC | PRN
  Administered 2011-10-17: 1 mg via INTRAVENOUS

## 2011-10-17 MED ORDER — HYDROMORPHONE HCL PF 1 MG/ML IJ SOLN
INTRAMUSCULAR | Status: AC
  Filled 2011-10-17: qty 1

## 2011-10-17 MED ORDER — LIDOCAINE HCL (CARDIAC) 20 MG/ML IV SOLN
INTRAVENOUS | Status: DC | PRN
  Administered 2011-10-17: 60 mg via INTRAVENOUS

## 2011-10-17 MED ORDER — KETOROLAC TROMETHAMINE 30 MG/ML IJ SOLN: 15.0000 mg | Freq: Once | INTRAMUSCULAR | Status: DC | PRN

## 2011-10-17 MED ORDER — ACETAMINOPHEN 325 MG PO TABS
650.0000 mg | ORAL_TABLET | ORAL | Status: DC | PRN
  Administered 2011-10-17: 650 mg via ORAL
  Filled 2011-10-17: qty 2

## 2011-10-17 SURGICAL SUPPLY — 66 items
BAG URINE DRAINAGE (UROLOGICAL SUPPLIES) ×4 IMPLANT
BARRIER ADHS 3X4 INTERCEED (GAUZE/BANDAGES/DRESSINGS) ×4 IMPLANT
BENZOIN TINCTURE PRP APPL 2/3 (GAUZE/BANDAGES/DRESSINGS) ×4 IMPLANT
CABLE HIGH FREQUENCY MONO STRZ (ELECTRODE) ×4 IMPLANT
CATH FOLEY 3WAY  5CC 16FR (CATHETERS)
CATH FOLEY 3WAY 5CC 16FR (CATHETERS) IMPLANT
CATH FOLEY LF 3WAY 5CC16FR (CATHETERS) ×4 IMPLANT
CHLORAPREP W/TINT 26ML (MISCELLANEOUS) ×4 IMPLANT
CLOTH BEACON ORANGE TIMEOUT ST (SAFETY) ×4 IMPLANT
CONT PATH 16OZ SNAP LID 3702 (MISCELLANEOUS) ×4 IMPLANT
COVER MAYO STAND STRL (DRAPES) ×4 IMPLANT
COVER TABLE BACK 60X90 (DRAPES) ×8 IMPLANT
COVER TIP SHEARS 8 DVNC (MISCELLANEOUS) ×3 IMPLANT
COVER TIP SHEARS 8MM DA VINCI (MISCELLANEOUS) ×1
DECANTER SPIKE VIAL GLASS SM (MISCELLANEOUS) ×4 IMPLANT
DERMABOND ADVANCED (GAUZE/BANDAGES/DRESSINGS) ×1
DERMABOND ADVANCED .7 DNX12 (GAUZE/BANDAGES/DRESSINGS) ×3 IMPLANT
DRAPE HUG U DISPOSABLE (DRAPE) ×4 IMPLANT
DRAPE LG THREE QUARTER DISP (DRAPES) ×8 IMPLANT
DRAPE MONITOR DA VINCI (DRAPE) IMPLANT
DRAPE WARM FLUID 44X44 (DRAPE) ×4 IMPLANT
ELECT REM PT RETURN 9FT ADLT (ELECTROSURGICAL) ×4
ELECTRODE REM PT RTRN 9FT ADLT (ELECTROSURGICAL) ×3 IMPLANT
EVACUATOR SMOKE 8.L (FILTER) ×4 IMPLANT
GAUZE VASELINE 3X9 (GAUZE/BANDAGES/DRESSINGS) IMPLANT
GLOVE BIOGEL PI IND STRL 7.0 (GLOVE) ×6 IMPLANT
GLOVE BIOGEL PI INDICATOR 7.0 (GLOVE) ×2
GLOVE ECLIPSE 6.5 STRL STRAW (GLOVE) ×12 IMPLANT
GOWN STRL REIN XL XLG (GOWN DISPOSABLE) ×24 IMPLANT
KIT ACCESSORY DA VINCI DISP (KITS) ×1
KIT ACCESSORY DVNC DISP (KITS) ×3 IMPLANT
KIT DISP ACCESSORY 4 ARM (KITS) IMPLANT
MARKER SKIN DUAL TIP RULER LAB (MISCELLANEOUS) ×4 IMPLANT
NEEDLE INSUFFLATION 14GA 120MM (NEEDLE) ×4 IMPLANT
OCCLUDER COLPOPNEUMO (BALLOONS) ×4 IMPLANT
PACK LAVH (CUSTOM PROCEDURE TRAY) ×4 IMPLANT
PAD PREP 24X48 CUFFED NSTRL (MISCELLANEOUS) ×8 IMPLANT
PLUG CATH AND CAP STER (CATHETERS) ×4 IMPLANT
PROTECTOR NERVE ULNAR (MISCELLANEOUS) ×8 IMPLANT
SCRUB TECHNI CARE 4 OZ NO DYE (MISCELLANEOUS) ×8 IMPLANT
SET CYSTO W/LG BORE CLAMP LF (SET/KITS/TRAYS/PACK) ×4 IMPLANT
SET IRRIG TUBING LAPAROSCOPIC (IRRIGATION / IRRIGATOR) ×4 IMPLANT
SOLUTION ELECTROLUBE (MISCELLANEOUS) ×4 IMPLANT
SPONGE LAP 18X18 X RAY DECT (DISPOSABLE) IMPLANT
STRIP CLOSURE SKIN 1/4X4 (GAUZE/BANDAGES/DRESSINGS) ×4 IMPLANT
SUT VIC AB 0 CT1 27 (SUTURE) ×5
SUT VIC AB 0 CT1 27XBRD ANBCTR (SUTURE) ×15 IMPLANT
SUT VICRYL 0 UR6 27IN ABS (SUTURE) ×4 IMPLANT
SUT VICRYL RAPIDE 4/0 PS 2 (SUTURE) ×8 IMPLANT
SUT VLOC 180 0 9IN  GS21 (SUTURE) ×1
SUT VLOC 180 0 9IN GS21 (SUTURE) ×3 IMPLANT
SYR 50ML LL SCALE MARK (SYRINGE) ×4 IMPLANT
SYSTEM CONVERTIBLE TROCAR (TROCAR) IMPLANT
TIP RUMI ORANGE 6.7MMX12CM (TIP) ×4 IMPLANT
TIP UTERINE 5.1X6CM LAV DISP (MISCELLANEOUS) IMPLANT
TIP UTERINE 6.7X10CM GRN DISP (MISCELLANEOUS) ×4 IMPLANT
TIP UTERINE 6.7X6CM WHT DISP (MISCELLANEOUS) IMPLANT
TIP UTERINE 6.7X8CM BLUE DISP (MISCELLANEOUS) IMPLANT
TOWEL OR 17X24 6PK STRL BLUE (TOWEL DISPOSABLE) ×8 IMPLANT
TROCAR DISP BLADELESS 8 DVNC (TROCAR) ×3 IMPLANT
TROCAR DISP BLADELESS 8MM (TROCAR) ×1
TROCAR XCEL NON-BLD 5MMX100MML (ENDOMECHANICALS) ×4 IMPLANT
TROCAR Z-THREAD 12X150 (TROCAR) ×4 IMPLANT
TUBING FILTER THERMOFLATOR (ELECTROSURGICAL) ×4 IMPLANT
WARMER LAPAROSCOPE (MISCELLANEOUS) ×4 IMPLANT
WATER STERILE IRR 1000ML POUR (IV SOLUTION) ×12 IMPLANT

## 2011-10-17 NOTE — Anesthesia Procedure Notes (Signed)
Procedure Name: Intubation Date/Time: 10/17/2011 7:30 AM Performed by: Jhonnie Garner Pre-anesthesia Checklist: Patient identified, Patient being monitored, Suction available and Emergency Drugs available Patient Re-evaluated:Patient Re-evaluated prior to inductionOxygen Delivery Method: Circle system utilized Preoxygenation: Pre-oxygenation with 100% oxygen Intubation Type: IV induction Ventilation: Mask ventilation without difficulty Laryngoscope Size: Miller and 2 Grade View: Grade III Tube type: Oral Tube size: 7.5 mm Number of attempts: 2 Airway Equipment and Method: Stylet and Video-laryngoscopy Placement Confirmation: ETT inserted through vocal cords under direct vision,  positive ETCO2 and breath sounds checked- equal and bilateral Secured at: 21 cm Tube secured with: Tape Dental Injury: Teeth and Oropharynx as per pre-operative assessment  Difficulty Due To: Difficult Airway- due to anterior larynx

## 2011-10-17 NOTE — Anesthesia Postprocedure Evaluation (Signed)
  Anesthesia Post-op Note  Patient: Sarah Mullen  Procedure(s) Performed: Procedure(s) (LRB): ROBOTIC ASSISTED TOTAL HYSTERECTOMY (N/A) CYSTOSCOPY () ROBOTIC ASSISTED BILATERAL SALPINGO OOPHERECTOMY (Bilateral)  Patient Location: PACU  Anesthesia Type: General  Level of Consciousness: awake, alert  and oriented  Airway and Oxygen Therapy: Patient Spontanous Breathing  Post-op Pain: none  Post-op Assessment: Post-op Vital signs reviewed  Post-op Vital Signs: Reviewed and stable  Complications: No apparent anesthesia complications

## 2011-10-17 NOTE — Discharge Instructions (Addendum)
Post Op Hysterectomy Instructions Please read the instructions below. Refer to these instructions for the next few weeks. These instructions provide you with general information on caring for yourself after surgery. Your caregiver may also give you specific instructions. While your treatment has been planned according to the most current medical practices available, unavoidable problems sometimes happen. If you have any problems or questions after you leave, please call your caregiver.  HOME CARE INSTRUCTIONS Healing will take time. You will have discomfort, tenderness, swelling and bruising at the operative site for a couple of weeks. This is normal and will get better as time goes on.   Only take over-the-counter or prescription medicines for pain, discomfort or fever as directed by your caregiver.   Do not take aspirin. It can cause bleeding.  This include Excedrin Migraine.  Do not drive when taking pain medication.   Follow your caregiver's advice regarding diet, exercise, lifting, driving and general activities.   Resume your usual diet as directed and allowed.   Get plenty of rest and sleep.   Do not douche, use tampons, or have sexual intercourse for EIGHT weeks.  Take your temperature if you feel hot or flushed.   You may shower today when you get home.  No tub bath for one week.    Do not drink alcohol until you are not taking any narcotic pain medications.   Try to have someone home with you for a week or two to help with the household activities.   Be careful over the next two to three weeks with any activities at home that involve lifting, pushing, or pulling.  Listen to your body--if something feels uncomfortable to do, then don't do it.  Make sure you and your family understands everything about your operation and recovery.   Walking up stairs is fine.  Do not sign any legal documents until you feel normal again.   Keep all your follow-up appointments as recommended  by your caregiver.   PLEASE CALL THE OFFICE IF:  There is swelling, redness or increasing pain in the wound area.   Pus is coming from the wound.   You notice a bad smell from the wound or surgical dressing.   You have pain, redness and swelling from the intravenous site.   The wound is breaking open (the edges are not staying together).    You develop pain or bleeding when you urinate.   You develop abnormal vaginal discharge.   You have any type of abnormal reaction or develop an allergy to your medication.   You need stronger pain medication for your pain   SEEK IMMEDIATE MEDICAL CARE:  You develop a temperature of 100.5 or higher.   You develop sever abdominal pain.   You develop chest pain and/or shortness of breath.   You pass out.   You develop pain, swelling or redness of your leg that make you worry about a deep vein thrombosis.  You develop heavy vaginal bleeding with or without blood clots.   MEDICATIONS:  Restart your regular medications BUT wait one week before restarting all vitamins and mineral supplements.  Do not restart your aspirin for at least two weeks post-operatively.  Use Motrin 800mg  every 8 hours for the next several days.  This will help you use less Percocet.  Use the Percocet 5/325 1-2 tabs every 4-6 hours as needed for pain.  You may use an over the counter stool softener like Colace or Dulcolax to help with starting a  bowel movement.  Start the day after you go home.  Warm liquids, fluids, and ambulation help too.  If you have not had a bowel movement in four days, you need to call the office.   A good rule of thumb is to take a Colace every time you take a Percocet.  This will help you not to be constipated.  After you are having regular bowel movements, I would like you to take a multivitamin with iron.  I will recheck your hemoglobin at your one month follow-up appointment.

## 2011-10-17 NOTE — Op Note (Signed)
10/17/2011  11:06 AM  PATIENT:  Sarah Mullen  56 y.o. female [phone] with heavy bleeding on HRT, fibroids, enlarged uterus desirous of definitive treatment.  PRE-OPERATIVE DIAGNOSIS:  menorrhagia;fibroiods  POST-OPERATIVE DIAGNOSIS:  menorrhagia,fibroids  PROCEDURE:  Procedure(s): ROBOTIC ASSISTED TOTAL HYSTERECTOMY CYSTOSCOPY ROBOTIC ASSISTED BILATERAL SALPINGO OOPHERECTOMY  SURGEON:  Zriyah Kopplin,M SUZANNE  ASSISTANTS: ROMINE, CYNTHIA   ANESTHESIA:   general  ESTIMATED BLOOD LOSS: 150cc  BLOOD ADMINISTERED:none   FLUIDS: 1500cc LR  UOP: 150cc clear  SPECIMEN:  Uterus, cervix, bilateral fallopian tubes and ovaries  DISPOSITION OF SPECIMEN:  PATHOLOGY  FINDINGS: enlarged uterus, posterior pedunculated fibroid, large and thick scar from prior classical cesarean section  DESCRIPTION OF OPERATION:  Patient was taken to the operating room. She was placed in the supine position. The arms were tucked by the side while she was awake. Legs were placed in the low lithotomy position in [place] stirrups while she was awake. She did have SCDs on her lower extremities and these were functioning properly. The patient was comfortable in this positioning then general endotracheal anesthesia was administered by the anesthesia staff without difficulty. Dr. Cristela Blue oversaw case. Initially they did have some difficulty with intubating used to traditional methods and a glide scope was used. The bean bag was inflated this port arms and shoulders with a Trendelenburg positioning. Once adequate anesthesia was confirmed a timeout was performed. The abdomen was then prepped with chlor prep and the perineum, inner thighs, and vagina were prepped with chlorhexidine as patient is allergic to IV contrast. Once 3 minutes past the patient was draped in a normal standard fashion. Legs are lifted to the high lithotomy position and attention was turned to the vagina.  A heavy weighted speculum and a curved Deaver  retractor were used to visualize the cervix. This was very high and anterior in the vagina. The uterus sounded to 13 cm. The cervix is dilated using Pratt dilators up to a #21. Because of the history of prior classical cesarean section and the decreased mobility of the uterus on exam, I felt the patient probably had some scarring from the prior cesarean section.  This made passing sound and dilator quite difficult because of the severe anteflexion of the uterus. Care was taken not to perforate the uterus. Once the cervix was adequately dilated, stitch of #0 Vicryl was placed on either side of the cervix. A RUMI uterine manipulator was obtained and a #12 disposable tip is attached to the end. Then a small KOH ring was placed and this was all passed to the cervical canal.  The KOH ring fit well around the cervix.  The sutures were brought through the KOH ring as a means of manipulating and retracting on the cervix during the procedure. The Deaver and heavy weighted speculum were removed. The bulb inside the disposable tip on the RUMI uterine manipulator was inflated with 6 cc of normal saline. A Foley catheter was placed in the bladder to straight drain and the legs were lowered to the low lithotomy position.  Attention was turned to the umbilicus. A ropivacaine mixture (0.5% mixed one-to-one with normal saline) was used to anesthetize the skin. A small nick in the umbilicus was made. The abdomen was elevated and a Veress needle was inserted directly into the abdomen. The peritoneum was felt as a pop. A normal saline syringe was attached to the needle and an aspiration was performed. No blood or fluid was noted. Fluid injected easily into the syringe and a second aspiration  was performed. No blood, fluid, or saline was noted. Then fluid dripped easily into the needle. CO2 gas was attached to the needle and a pneumoperitoneum was achieved without difficulty. There were only low flows of gas are present during this  portion of the procedure. Once 3 L of gas was in the abdomen, the location for left upper quadrant port was identified. This placed in the midclavicular line, 2 cm beneath the rib edge. The skin was anesthetized with ropivacaine mixture and nicked with a #11 blade. Then a 5 mm disposable trocar with an Optiview tip was passed through the abdominal wall layers using direct visualization with a 5 mm laparoscope. This port was removed and the laparoscope was used to through the trocar to visualize pelvis. A large thick scar that was attached to the anterior aspect of the uterus all the way down to the cervix was noted there were some scarring on the left fallopian tube and ovary but there is no evidence of endometriosis. The right and left tube with normal. The upper abdomen was surveyed it was normal. The liver edge was normal. Decompressed the gallbladder was noted. A normal stomach edge was noted. In the location for the midline port was chosen and this was placed about 2 cm above the umbilicus. The skin was anesthetized with ropivacaine mixture and then nicked with a #11 blade. A 12 mm disposable bladed trocar port were placed under direct visualization. The port sites were chosen for the #1 in 2 arm of the robot. These were placed about 10 cm from the umbilical incision. The skin was anesthetized with ropivacaine mixture and nicked with #11 blades, and then 5 mm nondisposable trochars and ports were placed. At this point the patient was placed in Trendelenburg positioning and the robot was docked on the right side in a normal standard fashion. In arm 1 was a monopolar scissor and in arm 2 the bipolar [place]. 60 cc of ropivacaine mixture was injected into the pelvis today the peritoneum throughout the procedure and hopefully decrease the narcotic need of the patient during the surgery.  Initially, the thick scar from the prior cesarean section was dissected sharply keeping close to the uterus. The thickness of  the scar eventually turned into the filmy adhesions which were carefully dissected down to the level of the internal os of the cervix. The ureters were noted on both sides. Attention was turned to the right side and the uterus was placed on stretch to the left. The right IP ligament was serially clamped cauterized and incised and then the right round ligament was serially clamped cauterized and incised. The anterior posterior leaf of the broad ligament were then incised. The anterior leaf was incised the level of the internal os in the posterior leaf was incised ports right uterosacral ligament. Careful dissection of the pubovesicocervical fascia was performed to try and remove the bladder down on the cervix and out of the way were the colpotomy would be performed later in the case. The white glistening tissue of the cervix was visualized and then the right uterine artery was skeletonized. Just above the level of the KOH ring was clamped cauterized and incised.  Attention was then turned to the left side with the uterus on stretch to the right. The fallopian tube with scar to the sidewall this was freed with sharp dissection. Then the left IP ligament was serially clamped cauterized and incised and the left round ligament was serially clamped cauterized and incised. The posterior  leaf of the broad ligament was opened and carried down to the level of the uterosacral ligament. The anterior leaf was opened until I encountered scarring that was still not dissected. At this point approximately 20 minutes was spent dissecting the scar on the side to free the bladder and the high and the pubovesicocervical fascia plane. Then the left uterine artery was skeletonized. Was serially clamped cauterized and incised just above the level of the KOH ring.  At this point the uterus was devascularize to the was obviously too big to bring out of the vagina. Therefore it was cut down the middle with monopolar cautery on cut  setting. Once he uterus was divided in half the left side was indicated above the cervix. I felt at this point the right side of the uterus and the cervix should be delivered through the vagina without difficulty. Then the colpotomy was performed using the scissors with an open tip a monopolar cautery setting. This was started anteriorly and brought around the cervix and a clockwise fashion. Once the colpotomy was completed, the cervix and the right half the uterus were delivered without difficulty to the vagina.  Second half of the uterus was stitched into the vagina it was grasped with a sponge stick by the vaginal assistant and this portion was delivered without difficulty as well. An occluder was placed back in the vagina and to maintain pneumoperitoneum. The cuff was closed with a V lock suture starting in the right corner and working across to the left corner.   As a noninterlocking 180 day suture. Once the cuff was closed the pelvis was irrigated. All pedicles were hemostatic. The cuff was hemostatic. No bleeding was noted. In Interceed was placed across the vaginal cuff. The ureters were noted to peristalse bilaterally. At this point the instruments were removed and the robot was undocked. The patient was taken out of Trendelenburg positioning and the ports were removed under direct visualization of the laparoscope. The midline scope was removed and before the midline port was removed the CRNA give the patient several deep breaths to try and get any gas at the abdomen. At this point the midline port was removed. The midline was closed with a figure-of-eight suture of #0 Vicryl. The remaining incisions were all 8 and 5 mm, today were closed with subcuticular stitch of 3-0 Vicryl. Dermabond was then placed across all incisions.  The legs are lifted to the high lithotomy position again. The vagina was wiped a sponge stick. There is no bleeding noted. The cuff was well approximated. A Foley catheter was  removed. A 30 cystoscope was used to visualize ureteral orifices and the entire bladder mucosa. The CRNA give the patient indigo carmine and blue dye was seen spurring out of both ureteral orifices. There was no abnormality inside the bladder and there were no stitches inside the bladder. The cystoscope was removed. The Foley catheter was left out of the bladder. The legs were lowered to the supine position. Sponge, lap, needle, and instrument counts were correct x2. The patient was awake from anesthesia and extubated without difficulty. She was and taken to the recovery room stable condition.   COUNTS:  YES  PLAN OF CARE:Transfer to PACU

## 2011-10-17 NOTE — Addendum Note (Signed)
Addendum  created 10/17/11 1553 by Armanda Heritage, RN   Modules edited:Notes Section

## 2011-10-17 NOTE — Anesthesia Preprocedure Evaluation (Signed)
Anesthesia Evaluation  Patient identified by MRN, date of birth, ID band Patient awake    Reviewed: Allergy & Precautions, H&P , Patient's Chart, lab work & pertinent test results, reviewed documented beta blocker date and time   History of Anesthesia Complications Negative for: history of anesthetic complications  Airway Mallampati: II TM Distance: >3 FB Neck ROM: full    Dental No notable dental hx.    Pulmonary neg pulmonary ROS,  breath sounds clear to auscultation  Pulmonary exam normal       Cardiovascular Exercise Tolerance: Good negative cardio ROS  + Valvular Problems/Murmurs Rhythm:regular Rate:Normal     Neuro/Psych  Headaches, PSYCHIATRIC DISORDERS negative neurological ROS  negative psych ROS   GI/Hepatic negative GI ROS, Neg liver ROS, GERD-  Controlled,  Endo/Other  negative endocrine ROS  Renal/GU negative Renal ROS     Musculoskeletal   Abdominal   Peds  Hematology negative hematology ROS (+)   Anesthesia Other Findings Depression     Diverticulitis        Chronic headaches     GERD (gastroesophageal reflux disease)        Migraines     Allergic rhinitis, cause unspecified        Blood transfusion   iron transfusion- [place]-2009 Heart murmur   copy of echo on chart    History of sinus tachycardia   copy of holter monitor results on chart    Reproductive/Obstetrics negative OB ROS                           Anesthesia Physical Anesthesia Plan  ASA: II  Anesthesia Plan: General ETT   Post-op Pain Management:    Induction:   Airway Management Planned:   Additional Equipment:   Intra-op Plan:   Post-operative Plan:   Informed Consent: I have reviewed the patients History and Physical, chart, labs and discussed the procedure including the risks, benefits and alternatives for the proposed anesthesia with the patient or authorized representative who has  indicated his/her understanding and acceptance.   Dental Advisory Given  Plan Discussed with: CRNA and Surgeon  Anesthesia Plan Comments:         Anesthesia Quick Evaluation

## 2011-10-17 NOTE — Progress Notes (Signed)
Day of Surgery Procedure(s) (LRB): ROBOTIC ASSISTED TOTAL HYSTERECTOMY (N/A) CYSTOSCOPY () ROBOTIC ASSISTED BILATERAL SALPINGO OOPHERECTOMY (Bilateral)  Subjective: Patient reports tolerating PO.  Good pain control.  No nausea.  Voiding and ambulating.    Objective: I have reviewed patient's vital signs, intake and output, medications and labs.  General: alert and cooperative Resp: clear to auscultation bilaterally Cardio: regular rate and rhythm and systolic murmur: early systolic 2/6,    GI: soft, non-tender; bowel sounds normal; no masses,  no organomegaly and incision: clean, dry and intact Extremities: extremities normal, atraumatic, no cyanosis or edema Vaginal Bleeding: minimal  Assessment: s/p Procedure(s) (LRB): ROBOTIC ASSISTED TOTAL HYSTERECTOMY (N/A) CYSTOSCOPY () ROBOTIC ASSISTED BILATERAL SALPINGO OOPHERECTOMY (Bilateral): stable and progressing well  Plan: Advance diet Encourage ambulation Advance to PO medication Plan discharge in the AM.  LOS: 0 days    Kamir Selover,M SUZANNE 10/17/2011, 6:09 PM

## 2011-10-17 NOTE — Preoperative (Signed)
Beta Blockers   Reason not to administer Beta Blockers:Not Applicable 

## 2011-10-17 NOTE — Transfer of Care (Signed)
Immediate Anesthesia Transfer of Care Note  Patient: Sarah Mullen  Procedure(s) Performed: Procedure(s) (LRB): ROBOTIC ASSISTED TOTAL HYSTERECTOMY (N/A) CYSTOSCOPY () ROBOTIC ASSISTED BILATERAL SALPINGO OOPHERECTOMY (Bilateral)  Patient Location: PACU  Anesthesia Type: General  Level of Consciousness: sedated  Airway & Oxygen Therapy: Patient Spontanous Breathing and Patient connected to nasal cannula oxygen  Post-op Assessment: Report given to PACU RN and Post -op Vital signs reviewed and stable  Post vital signs: Reviewed  Complications: No apparent anesthesia complications

## 2011-10-17 NOTE — Anesthesia Postprocedure Evaluation (Signed)
  Anesthesia Post-op Note  Patient: Sarah Mullen  Procedure(s) Performed: Procedure(s) (LRB): ROBOTIC ASSISTED TOTAL HYSTERECTOMY (N/A) CYSTOSCOPY () ROBOTIC ASSISTED BILATERAL SALPINGO OOPHERECTOMY (Bilateral)  Patient is awake and responsive. Pain and nausea are reasonably well controlled. Vital signs are stable and clinically acceptable. Oxygen saturation is clinically acceptable. There are no apparent anesthetic complications at this time. Patient is ready for discharge.

## 2011-10-18 ENCOUNTER — Encounter (HOSPITAL_COMMUNITY): Payer: Self-pay | Admitting: Obstetrics & Gynecology

## 2011-10-18 NOTE — Progress Notes (Signed)
1 Day Post-Op Procedure(s) (LRB): ROBOTIC ASSISTED TOTAL HYSTERECTOMY (N/A) CYSTOSCOPY () ROBOTIC ASSISTED BILATERAL SALPINGO OOPHERECTOMY (Bilateral)  Subjective: Patient reports tolerating PO and no problems voiding.  Good pain control.  No nausea.  No flatus yet.  Objective: I have reviewed patient's vital signs, intake and output, medications and labs.  General: alert and cooperative Resp: clear to auscultation bilaterally Cardio: regular rate and rhythm GI: normal findings: bowel sounds normal Extremities: extremities normal, atraumatic, no cyanosis or edema Vaginal Bleeding: minimal  Assessment: s/p Procedure(s) (LRB): ROBOTIC ASSISTED TOTAL HYSTERECTOMY (N/A) CYSTOSCOPY () ROBOTIC ASSISTED BILATERAL SALPINGO OOPHERECTOMY (Bilateral): stable and progressing well  Plan: Discharge home  LOS: 1 day    Hayk Divis,M SUZANNE 10/18/2011, 8:04 AM

## 2011-10-18 NOTE — Discharge Summary (Signed)
Physician Discharge Summary  Patient ID: Sarah Mullen MRN: [id number] DOB/AGE: 10/14/1955 56 y.o.  Admit date: 10/17/2011 Discharge date: 10/18/2011  Admission Diagnoses:menorrhagia, fibroids, enlarged uterus, anemia  Discharge Diagnoses:  Active Problems:  * No active hospital problems. *    Discharged Condition: good  Hospital Course: Patient admitted through same day surgery.  TLH/BSO, robotic assisted was performed.  After appropriate time in PACU, patient transferred to third floor.  She was able to ambulate and void.  6hr post ob hemoglobin was 9.9.  Pre op was 10.4.  Patient tolerated regular diet.  She was transitioned to po pain meds.  In AM of POD#1, patient was meeting all criteria for discharge.  Exam normal.  VSS/AF.  Consults: None  Significant Diagnostic Studies: labs: 9.9 hemoglobin  Treatments: surgery: robotic assisted TLH/BSO  Discharge Exam: Blood pressure 101/54, pulse 95, temperature 99.1 F (37.3 C), temperature source Oral, resp. rate 17, height 5' (1.524 m), weight 74.844 kg (165 lb), SpO2 95.00%. General appearance: alert and cooperative Resp: clear to auscultation bilaterally Cardio: regular rate and rhythm GI: normal findings: bowel sounds normal and soft, non-tender Extremities: extremities normal, atraumatic, no cyanosis or edema Incision/Wound:clean,dry, intact  Disposition: Final discharge disposition not confirmed   Medication List  As of 10/18/2011  8:08 AM   STOP taking these medications         aspirin-acetaminophen-caffeine 250-250-65 MG per tablet      FEMRING 0.1 MG/24HR Ring      pantoprazole 40 MG tablet         TAKE these medications         aspirin EC 81 MG tablet   Take 81 mg by mouth daily.      cetirizine 10 MG tablet   Commonly known as: ZYRTEC   Take 10 mg by mouth daily.      DULoxetine 60 MG capsule   Commonly known as: CYMBALTA   Take 1 capsule (60 mg total) by mouth daily.      Testosterone 10 MG/ACT (2%) Gel     Place 5 % onto the skin daily. Don't use for 1 week following procedure on           Follow-up Information    Follow up with Lum Keas, MD. (10/24/11 at 12:45PM)    Contact information:   [address], [address] [place] [address] [phone]          Signed: Lum Keas 10/18/2011, 8:08 AM

## 2011-11-23 ENCOUNTER — Other Ambulatory Visit: Payer: Self-pay | Admitting: *Deleted

## 2011-11-23 DIAGNOSIS — F32A Depression, unspecified: Secondary | ICD-10-CM

## 2011-11-23 DIAGNOSIS — F329 Major depressive disorder, single episode, unspecified: Secondary | ICD-10-CM

## 2011-11-23 MED ORDER — DULOXETINE HCL 60 MG PO CPEP: 60.0000 mg | ORAL_CAPSULE | Freq: Every day | ORAL | Status: DC

## 2012-02-05 ENCOUNTER — Other Ambulatory Visit: Payer: Self-pay | Admitting: Internal Medicine

## 2012-03-11 ENCOUNTER — Other Ambulatory Visit: Payer: Self-pay | Admitting: *Deleted

## 2012-03-11 DIAGNOSIS — F329 Major depressive disorder, single episode, unspecified: Secondary | ICD-10-CM

## 2012-03-11 DIAGNOSIS — F32A Depression, unspecified: Secondary | ICD-10-CM

## 2012-03-11 MED ORDER — DULOXETINE HCL 60 MG PO CPEP: 60.0000 mg | ORAL_CAPSULE | Freq: Every day | ORAL | Status: DC

## 2012-04-17 ENCOUNTER — Other Ambulatory Visit: Payer: Self-pay | Admitting: Internal Medicine

## 2012-04-18 ENCOUNTER — Other Ambulatory Visit: Payer: Self-pay | Admitting: General Practice

## 2012-04-18 DIAGNOSIS — F32A Depression, unspecified: Secondary | ICD-10-CM

## 2012-04-18 DIAGNOSIS — F329 Major depressive disorder, single episode, unspecified: Secondary | ICD-10-CM

## 2012-04-18 MED ORDER — DULOXETINE HCL 60 MG PO CPEP: 60.0000 mg | ORAL_CAPSULE | Freq: Every day | ORAL | Status: DC

## 2012-05-22 ENCOUNTER — Ambulatory Visit (INDEPENDENT_AMBULATORY_CARE_PROVIDER_SITE_OTHER): Payer: 59 | Admitting: Internal Medicine

## 2012-05-22 ENCOUNTER — Encounter: Payer: Self-pay | Admitting: Internal Medicine

## 2012-05-22 ENCOUNTER — Other Ambulatory Visit (INDEPENDENT_AMBULATORY_CARE_PROVIDER_SITE_OTHER): Payer: 59

## 2012-05-22 VITALS — BP 118/72 | HR 100 | Temp 98.1°F | Ht 60.0 in | Wt 168.4 lb

## 2012-05-22 DIAGNOSIS — Z0000 Encounter for general adult medical examination without abnormal findings: Secondary | ICD-10-CM

## 2012-05-22 LAB — CBC WITH DIFFERENTIAL/PLATELET
Basophils Absolute: 0 10*3/uL (ref 0.0–0.1)
Eosinophils Relative: 2.6 % (ref 0.0–5.0)
HCT: 44.3 % (ref 36.0–46.0)
Lymphocytes Relative: 26.1 % (ref 12.0–46.0)
Lymphs Abs: 2 10*3/uL (ref 0.7–4.0)
Monocytes Relative: 5.4 % (ref 3.0–12.0)
Neutrophils Relative %: 65.4 % (ref 43.0–77.0)
Platelets: 305 10*3/uL (ref 150.0–400.0)
RDW: 14.1 % (ref 11.5–14.6)
WBC: 7.8 10*3/uL (ref 4.5–10.5)

## 2012-05-22 LAB — HEPATIC FUNCTION PANEL
ALT: 57 U/L — ABNORMAL HIGH (ref 0–35)
AST: 29 U/L (ref 0–37)
Bilirubin, Direct: 0.1 mg/dL (ref 0.0–0.3)
Total Bilirubin: 0.5 mg/dL (ref 0.3–1.2)
Total Protein: 7.1 g/dL (ref 6.0–8.3)

## 2012-05-22 LAB — BASIC METABOLIC PANEL
BUN: 19 mg/dL (ref 6–23)
Calcium: 9.3 mg/dL (ref 8.4–10.5)
Creatinine, Ser: 0.9 mg/dL (ref 0.4–1.2)
GFR: 69.51 mL/min (ref >60.00)
Glucose, Bld: 104 mg/dL — ABNORMAL HIGH (ref 70–99)

## 2012-05-22 LAB — URINALYSIS, ROUTINE W REFLEX MICROSCOPIC
Bilirubin Urine: NEGATIVE
Leukocytes, UA: NEGATIVE
Nitrite: NEGATIVE
pH: 7 (ref 5.0–8.0)

## 2012-05-22 LAB — LIPID PANEL: HDL: 43.4 mg/dL (ref >39.00)

## 2012-05-22 LAB — TSH: TSH: 1.56 u[IU]/mL (ref 0.35–5.50)

## 2012-05-22 MED ORDER — PANTOPRAZOLE SODIUM 40 MG PO TBEC: 40.0000 mg | DELAYED_RELEASE_TABLET | Freq: Every day | ORAL | Status: DC

## 2012-05-22 MED ORDER — DULOXETINE HCL 60 MG PO CPEP: 60.0000 mg | ORAL_CAPSULE | Freq: Every day | ORAL | Status: DC

## 2012-05-22 MED ORDER — TRIAMCINOLONE ACETONIDE 0.1 % EX CREA: TOPICAL_CREAM | Freq: Two times a day (BID) | CUTANEOUS | Status: DC

## 2012-05-22 NOTE — Progress Notes (Signed)
Subjective:    Patient ID: Sarah Mullen, female    DOB: 03-21-1956, 56 y.o.   MRN: [id number]  HPI  patient is here today for annual physical. Patient feels well and has no complaints.  Past Medical History  Diagnosis Date  . Depression   . Diverticulitis   . Chronic headaches   . GERD (gastroesophageal reflux disease)   . Migraines   . Allergic rhinitis, cause unspecified   . Blood transfusion     iron transfusion- [place]-2009  . History of sinus tachycardia     copy of holter monitor results on chart   Family History  Problem Relation Age of Onset  . Arthritis Mother   . Hyperlipidemia Mother   . Heart disease Mother   . Arthritis Father   . Lung cancer Father   . Heart disease Father   . Hypertension Father   . Diabetes Father   . Colon cancer Paternal Aunt   . Kidney disease Paternal Grandmother   . Kidney disease Paternal Grandfather    History  Substance Use Topics  . Smoking status: Former Smoker    Types: Cigarettes    Quit date: 10/09/2001  . Smokeless tobacco: Not on file  . Alcohol Use: No    Review of Systems Constitutional: Negative for fever.  Respiratory: Negative for cough and shortness of breath.   Cardiovascular: Negative for chest pain.  Gastrointestinal: Negative for abdominal pain.  Musculoskeletal: Negative for gait problem.  Skin: Negative for rash.  Neurological: Negative for dizziness.  No other specific complaints in a complete review of systems (except as listed in HPI above).     Objective:   Physical Exam  BP 118/72  Pulse 100  Temp 98.1 F (36.7 C) (Oral)  Ht 5' (1.524 m)  Wt 168 lb 6.4 oz (76.386 kg)  BMI 32.89 kg/m2  SpO2 95%  LMP 09/24/2011 Wt Readings from Last 3 Encounters:  05/22/12 168 lb 6.4 oz (76.386 kg)  10/18/11 165 lb (74.844 kg)  10/18/11 165 lb (74.844 kg)   Constitutional: She is overweight, but appears well-developed and well-nourished. No distress.  HENT: Head: Normocephalic and atraumatic.  Ears; B TMs ok, no erythema or effusion; Nose: Nose normal. Mouth/Throat: Oropharynx is clear and moist. No oropharyngeal exudate.  Eyes: Conjunctivae and EOM are normal. Pupils are equal, round, and reactive to light. No scleral icterus.  Neck: Normal range of motion. Neck supple. No JVD or LAD present. No thyromegaly present.  Cardiovascular: Normal rate, regular rhythm and normal heart sounds.  No murmur heard. No BLE edema. Pulmonary/Chest: Effort normal and breath sounds normal. No respiratory distress. She has no wheezes.  Abdominal: Soft. Bowel sounds are normal. She exhibits no distension. There is no tenderness.  GU: defer to gyn Musculoskeletal: Normal range of motion, no joint effusions. No gross deformities Neurological: She is alert and oriented to person, place, and time. No cranial nerve deficit. Coordination normal.  Skin: Bilateral patches of eczema including pink base with peeling skin on each shin. Also superficial peeling of fingertips and toe tips bilaterally. Remaining skin is warm and dry. No rash noted. No erythema.  Psychiatric: She has a normal mood and affect. Her behavior is normal. Judgment and thought content normal.   Lab Results  Component Value Date   WBC 8.3 10/10/2011   HGB 9.9* 10/17/2011   HCT 34.6* 10/10/2011   PLT 418* 10/10/2011   CHOL 172 02/13/2011   TRIG 152.0* 02/13/2011   HDL 49.30 02/13/2011  ALT 27 02/13/2011   AST 21 02/13/2011   NA 135 02/13/2011   K 4.1 02/13/2011   CL 102 02/13/2011   CREATININE 0.7 02/13/2011   BUN 29* 02/13/2011   CO2 27 02/13/2011   TSH 2.54 02/13/2011   EKG - NSR @ 97bpm - no ischemic changes, no arrhythmias - no change from 01/2011     Assessment & Plan:   CPX - v70.0 - Patient has been counseled on age-appropriate routine health concerns for screening and prevention. These are reviewed and up-to-date. Immunizations are up-to-date or declined. Labs ordered and ECG reviewed.  Eczema. Bilateral shins. Patient reports break  out because of milk intolerance -patient addressing diet changes to control same, okay for steroid topical twice a day as needed to help resolution of skin symptoms

## 2012-05-22 NOTE — Patient Instructions (Signed)
It was good to see you today. We have reviewed your prior records including labs and tests today Health Maintenance reviewed - complete your flu shot and mammogram -all other recommended immunizations and age-appropriate screenings are up-to-date. Test(s) ordered today. Your results will be released to MyChart (or called to you) after review, usually within 72hours after test completion. If any changes need to be made, you will be notified at that same time. Medications reviewed, use triamcinolone cream for eczema rash as discussed  -no other changes at this time. Refill on medication(s) as discussed today. Your prescription(s) have been submitted to your pharmacy. Please take as directed and contact our office if you believe you are having problem(s) with the medication(s). Please schedule followup in 12 months for annual physical and labs, call sooner if problems. Health Maintenance, Females A healthy lifestyle and preventative care can promote health and wellness.  Maintain regular health, dental, and eye exams.   Eat a healthy diet. Foods like vegetables, fruits, whole grains, low-fat dairy products, and lean protein foods contain the nutrients you need without too many calories. Decrease your intake of foods high in solid fats, added sugars, and salt. Get information about a proper diet from your caregiver, if necessary.   Regular physical exercise is one of the most important things you can do for your health. Most adults should get at least 150 minutes of moderate-intensity exercise (any activity that increases your heart rate and causes you to sweat) each week. In addition, most adults need muscle-strengthening exercises on 2 or more days a week.     Maintain a healthy weight. The body mass index (BMI) is a screening tool to identify possible weight problems. It provides an estimate of body fat based on height and weight. Your caregiver can help determine your BMI, and can help you achieve or  maintain a healthy weight. For adults 20 years and older:   A BMI below 18.5 is considered underweight.   A BMI of 18.5 to 24.9 is normal.   A BMI of 25 to 29.9 is considered overweight.   A BMI of 30 and above is considered obese.   Maintain normal blood lipids and cholesterol by exercising and minimizing your intake of saturated fat. Eat a balanced diet with plenty of fruits and vegetables. Blood tests for lipids and cholesterol should begin at age 68 and be repeated every 5 years. If your lipid or cholesterol levels are high, you are over 50, or you are a high risk for heart disease, you may need your cholesterol levels checked more frequently. Ongoing high lipid and cholesterol levels should be treated with medicines if diet and exercise are not effective.   If you smoke, find out from your caregiver how to quit. If you do not use tobacco, do not start.   If you are pregnant, do not drink alcohol. If you are breastfeeding, be very cautious about drinking alcohol. If you are not pregnant and choose to drink alcohol, do not exceed 1 drink per day. One drink is considered to be 12 ounces (355 mL) of beer, 5 ounces (148 mL) of wine, or 1.5 ounces (44 mL) of liquor.   Avoid use of street drugs. Do not share needles with anyone. Ask for help if you need support or instructions about stopping the use of drugs.   High blood pressure causes heart disease and increases the risk of stroke. Blood pressure should be checked at least every 1 to 2 years. Ongoing high  blood pressure should be treated with medicines, if weight loss and exercise are not effective.   If you are 40 to 58 years old, ask your caregiver if you should take aspirin to prevent strokes.   Diabetes screening involves taking a blood sample to check your fasting blood sugar level. This should be done once every 3 years, after age 34, if you are within normal weight and without risk factors for diabetes. Testing should be considered at a  younger age or be carried out more frequently if you are overweight and have at least 1 risk factor for diabetes.   Breast cancer screening is essential preventative care for women. You should practice "breast self-awareness." This means understanding the normal appearance and feel of your breasts and may include breast self-examination. Any changes detected, no matter how small, should be reported to a caregiver. Women in their 5s and 30s should have a clinical breast exam (CBE) by a caregiver as part of a regular health exam every 1 to 3 years. After age 22, women should have a CBE every year. Starting at age 73, women should consider having a mammogram (breast X-ray) every year. Women who have a family history of breast cancer should talk to their caregiver about genetic screening. Women at a high risk of breast cancer should talk to their caregiver about having an MRI and a mammogram every year.   The Pap test is a screening test for cervical cancer. Women should have a Pap test starting at age 25. Between ages 66 and 52, Pap tests should be repeated every 2 years. Beginning at age 79, you should have a Pap test every 3 years as long as the past 3 Pap tests have been normal. If you had a hysterectomy for a problem that was not cancer or a condition that could lead to cancer, then you no longer need Pap tests. If you are between ages 36 and 2, and you have had normal Pap tests going back 10 years, you no longer need Pap tests. If you have had past treatment for cervical cancer or a condition that could lead to cancer, you need Pap tests and screening for cancer for at least 20 years after your treatment. If Pap tests have been discontinued, risk factors (such as a new sexual partner) need to be reassessed to determine if screening should be resumed. Some women have medical problems that increase the chance of getting cervical cancer. In these cases, your caregiver may recommend more frequent screening and  Pap tests.   The human papillomavirus (HPV) test is an additional test that may be used for cervical cancer screening. The HPV test looks for the virus that can cause the cell changes on the cervix. The cells collected during the Pap test can be tested for HPV. The HPV test could be used to screen women aged 62 years and older, and should be used in women of any age who have unclear Pap test results. After the age of 13, women should have HPV testing at the same frequency as a Pap test.   Colorectal cancer can be detected and often prevented. Most routine colorectal cancer screening begins at the age of 82 and continues through age 13. However, your caregiver may recommend screening at an earlier age if you have risk factors for colon cancer. On a yearly basis, your caregiver may provide home test kits to check for hidden blood in the stool. Use of a small camera at the end  of a tube, to directly examine the colon (sigmoidoscopy or colonoscopy), can detect the earliest forms of colorectal cancer. Talk to your caregiver about this at age 55, when routine screening begins. Direct examination of the colon should be repeated every 5 to 10 years through age 46, unless early forms of pre-cancerous polyps or small growths are found.   Hepatitis C blood testing is recommended for all people born from [birth year] through 1966 and any individual with known risks for hepatitis C.   Practice safe sex. Use condoms and avoid high-risk sexual practices to reduce the spread of sexually transmitted infections (STIs). Sexually active women aged 2 and younger should be checked for Chlamydia, which is a common sexually transmitted infection. Older women with new or multiple partners should also be tested for Chlamydia. Testing for other STIs is recommended if you are sexually active and at increased risk.   Osteoporosis is a disease in which the bones lose minerals and strength with aging. This can result in serious bone fractures.  The risk of osteoporosis can be identified using a bone density scan. Women ages 48 and over and women at risk for fractures or osteoporosis should discuss screening with their caregivers. Ask your caregiver whether you should be taking a calcium supplement or vitamin D to reduce the rate of osteoporosis.   Menopause can be associated with physical symptoms and risks. Hormone replacement therapy is available to decrease symptoms and risks. You should talk to your caregiver about whether hormone replacement therapy is right for you.   Use sunscreen with a sun protection factor (SPF) of 30 or greater. Apply sunscreen liberally and repeatedly throughout the day. You should seek shade when your shadow is shorter than you. Protect yourself by wearing long sleeves, pants, a wide-brimmed hat, and sunglasses year round, whenever you are outdoors.   Notify your caregiver of new moles or changes in moles, especially if there is a change in shape or color. Also notify your caregiver if a mole is larger than the size of a pencil eraser.   Stay current with your immunizations.  Document Released: 01/23/2011 Document Revised: 10/02/2011 Document Reviewed: 01/23/2011 [facility] Patient Information 2013 [place], [place].

## 2012-05-28 DIAGNOSIS — R7611 Nonspecific reaction to tuberculin skin test without active tuberculosis: Secondary | ICD-10-CM | POA: Insufficient documentation

## 2012-06-07 LAB — HM MAMMOGRAPHY

## 2012-06-17 ENCOUNTER — Encounter: Payer: Self-pay | Admitting: Internal Medicine

## 2012-06-25 ENCOUNTER — Encounter: Payer: Self-pay | Admitting: Internal Medicine

## 2012-07-09 DIAGNOSIS — Z0279 Encounter for issue of other medical certificate: Secondary | ICD-10-CM

## 2012-08-02 ENCOUNTER — Other Ambulatory Visit: Payer: Self-pay | Admitting: Infectious Diseases

## 2012-08-02 ENCOUNTER — Ambulatory Visit
Admission: RE | Admit: 2012-08-02 | Discharge: 2012-08-02 | Disposition: A | Discharge status: Home / Self Care | Payer: No Typology Code available for payment source | Source: Ambulatory Visit | Attending: Infectious Diseases | Admitting: Infectious Diseases

## 2012-08-02 DIAGNOSIS — R7611 Nonspecific reaction to tuberculin skin test without active tuberculosis: Secondary | ICD-10-CM

## 2012-08-05 ENCOUNTER — Encounter: Payer: Self-pay | Admitting: Internal Medicine

## 2012-08-05 ENCOUNTER — Telehealth: Payer: Self-pay | Admitting: Internal Medicine

## 2012-08-05 NOTE — Telephone Encounter (Signed)
Medication therapy as per health dept is appropriate even with negative CXR -

## 2012-08-05 NOTE — Telephone Encounter (Signed)
Pt had a positive TB test in May 28 2012.  She has a negative X-Ray Aug 02, 2012.  The [facility] Dept advised medication.  She is going back to the Health Dept tomorrow.  What does Dr. Felicity Coyer suggest?

## 2012-08-06 NOTE — Telephone Encounter (Signed)
Pt is aware.  

## 2012-09-03 ENCOUNTER — Encounter: Payer: Self-pay | Admitting: Internal Medicine

## 2012-09-03 DIAGNOSIS — R7611 Nonspecific reaction to tuberculin skin test without active tuberculosis: Secondary | ICD-10-CM

## 2012-09-04 ENCOUNTER — Encounter: Payer: Self-pay | Admitting: Internal Medicine

## 2012-09-04 NOTE — Telephone Encounter (Signed)
See above - order to refer to ID for 2nd opinion

## 2012-09-24 ENCOUNTER — Ambulatory Visit (INDEPENDENT_AMBULATORY_CARE_PROVIDER_SITE_OTHER): Payer: Self-pay | Admitting: Internal Medicine

## 2012-09-24 ENCOUNTER — Encounter: Payer: Self-pay | Admitting: Internal Medicine

## 2012-09-24 VITALS — BP 133/77 | HR 98 | Temp 97.8°F | Wt 164.0 lb

## 2012-09-24 DIAGNOSIS — A150 Tuberculosis of lung: Secondary | ICD-10-CM

## 2012-09-24 DIAGNOSIS — Z201 Contact with and (suspected) exposure to tuberculosis: Secondary | ICD-10-CM

## 2012-09-24 DIAGNOSIS — Z227 Latent tuberculosis: Secondary | ICD-10-CM

## 2012-09-24 NOTE — Progress Notes (Signed)
[facility] NOTE  RFV: positive quantiferon from annual employee screening Subjective:    Patient ID: Sarah Mullen, female    DOB: [birth year], 57 y.o.   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 Not know to take care of TB. No volunteering. No recent international travel. She was tested last fall as part of annual screening where she found out she had + QTF. She went to [facility] for evaluation, where CXR was negative, but did not offer treatment due to drug interactions.  Takes cymbalta for hip and back pain. Initially started for depression, but also noticed that she didn't have to take her meds for muscle relaxant, etc.  She has attempted to taper off, but had recurrence pain, thus now stays on her current dosage of meds. She is concerned for drug interaction if she needs treatment for latent TB  No weight loss. No cough. No nightsweats.   Current Outpatient Prescriptions on File Prior to Visit  Medication Sig Dispense Refill  . aspirin EC 81 MG tablet Take 81 mg by mouth daily.      . cetirizine (ZYRTEC) 10 MG tablet Take 10 mg by mouth daily.        . DULoxetine (CYMBALTA) 60 MG capsule Take 1 capsule (60 mg total) by mouth daily.  90 capsule  3  . estradiol (MINIVELLE) 0.1 MG/24HR Place 1 patch onto the skin 2 (two) times a week.      . pantoprazole (PROTONIX) 40 MG tablet Take 1 tablet (40 mg total) by mouth daily.  90 tablet  3  . Testosterone 10 MG/ACT (2%) GEL Place 5 % onto the skin daily. Don't use for 1 week following procedure on      . triamcinolone cream (KENALOG) 0.1 % Apply topically 2 (two) times daily.  30 g  0  . [DISCONTINUED] progesterone (PROMETRIUM) 200 MG capsule Take 200 mg by mouth daily.         No current facility-administered medications on file prior to visit.   History   Social History  . Marital Status: Single    Spouse Name: N/A    Number of  Children: N/A  . Years of Education: N/A   Occupational History  . Not on file.   Social History Main Topics  . Smoking status: Former Smoker    Types: Cigarettes    Quit date: 10/09/2001  . Smokeless tobacco: Not on file  . Alcohol Use: No  . Drug Use: No  . Sexually Active: Not on file   Other Topics Concern  . Not on file   Social History Narrative  . No narrative on file   family history includes Arthritis in her father and mother; Colon cancer in her paternal aunt; Diabetes in her father; Heart disease in her father and mother; Hyperlipidemia in her mother; Hypertension in her father; Kidney disease in her paternal grandfather and paternal grandmother; and Lung cancer in her father.  Review of Systems Review of Systems  Constitutional: Negative for fever, chills, diaphoresis, activity change, appetite change, fatigue and unexpected weight change.  HENT: Negative for congestion, sore throat, rhinorrhea, sneezing, trouble swallowing and sinus pressure.  Eyes: Negative for photophobia and visual disturbance.  Respiratory: Negative for cough, chest tightness, shortness of breath, wheezing and stridor.  Cardiovascular: Negative for chest pain, palpitations and leg swelling.  Gastrointestinal:  Negative for nausea, vomiting, abdominal pain, diarrhea, constipation, blood in stool, abdominal distention and anal bleeding.  Genitourinary: Negative for dysuria, hematuria, flank pain and difficulty urinating.  Musculoskeletal: Negative for myalgias, back pain, joint swelling, arthralgias and gait problem.  Skin: Negative for color change, pallor, rash and wound.  Neurological: Negative for dizziness, tremors, weakness and light-headedness.  Hematological: Negative for adenopathy. Does not bruise/bleed easily.  Psychiatric/Behavioral: Negative for behavioral problems, confusion, sleep disturbance, dysphoric mood, decreased concentration and agitation.       Objective:   Physical  Exam BP 133/77  Pulse 98  Temp(Src) 97.8 F (36.6 C) (Oral)  Wt 164 lb (74.39 kg)  BMI 32.03 kg/m2  LMP 09/24/2011 Physical Exam  Constitutional:  oriented to person, place, and time.  appears well-developed and well-nourished. No distress.  HENT:  Mouth/Throat: Oropharynx is clear and moist. No oropharyngeal exudate.  Cardiovascular: Normal rate, regular rhythm and normal heart sounds. Exam reveals no gallop and no friction rub.  No murmur heard.  Pulmonary/Chest: Effort normal and breath sounds normal. No respiratory distress. He has no wheezes.  Lymphadenopathy:  no cervical adenopathy.  Skin: Skin is warm and dry. No rash noted. No erythema.  Psychiatric: He has a normal mood and affect. His behavior is normal.          Assessment & Plan:  Latent Tb exposure, possible = we wil repeat QTF per occ health policy.  We will also communicate with employer health to see what her first QTF value. Based upon repeat testing, will decide if need to offer therapy.   Can reach patient through [place] email.

## 2012-10-01 LAB — QUANTIFERON TB GOLD ASSAY (BLOOD)
Mitogen value: 0.3 IU/mL
Quantiferon Tb Ag Minus Nil Value: 0 IU/mL

## 2012-12-11 ENCOUNTER — Encounter: Payer: Self-pay | Admitting: *Deleted

## 2012-12-12 ENCOUNTER — Encounter: Payer: Self-pay | Admitting: Obstetrics & Gynecology

## 2012-12-12 ENCOUNTER — Ambulatory Visit (INDEPENDENT_AMBULATORY_CARE_PROVIDER_SITE_OTHER): Payer: 59 | Admitting: Obstetrics & Gynecology

## 2012-12-12 VITALS — BP 120/66 | HR 84 | Ht 61.0 in | Wt 168.0 lb

## 2012-12-12 DIAGNOSIS — Z0000 Encounter for general adult medical examination without abnormal findings: Secondary | ICD-10-CM

## 2012-12-12 MED ORDER — ESTRADIOL 0.1 MG/24HR TD PTTW: 1.0000 | MEDICATED_PATCH | TRANSDERMAL | Status: DC

## 2012-12-12 MED ORDER — NONFORMULARY OR COMPOUNDED ITEM: Status: DC

## 2012-12-12 NOTE — Patient Instructions (Addendum)

## 2012-12-12 NOTE — Progress Notes (Signed)
Patient ID: Sarah Mullen, female   DOB: 03-31-1956, 57 y.o.   MRN: [id number]  58 y.o. G7P1. SingleCaucasianF here for annual exam.  Patient has a really bad migraine today.  No vaginal bleeding.  Brett Canales, significant other, in [place] with his family.  Daughter doing well.  Does have trouble with hemorrhoids.  Blood work in 10/13.  Reviewed in EPIC.   Had +TB test.  Neg CXR.  Had consult with ID.  Patient's last menstrual period was 09/24/2011.          Sexually active: yes  The current method of family planning is status post hysterectomy.    Exercising: no  The patient does not participate in regular exercise at present. Smoker:  no  Health Maintenance: Pap:  06/2010 normal History of abnormal Pap:  no MMG:  06/07/12, did have follow up exam that was negative (at [facility]) Colonoscopy:  2010, normal  BMD:   2010 TDaP:  02/20/11 Screening Labs: PCP, Hb today: PCP, Urine today: PCP   reports that she quit smoking about 11 years ago. Her smoking use included Cigarettes. She smoked 0.00 packs per day. She does not have any smokeless tobacco history on file. She reports that she does not drink alcohol or use illicit drugs.  Past Medical History  Diagnosis Date  . Depression   . Diverticulitis   . Chronic headaches   . GERD (gastroesophageal reflux disease)   . Migraines   . Allergic rhinitis, cause unspecified   . Blood transfusion     iron transfusion- [place]-2009  . History of sinus tachycardia     copy of holter monitor results on chart    Past Surgical History  Procedure Laterality Date  . Tonsillectomy    . Breast biopsy    . Cesarean section    . Cerclage removal    . Dilation and curettage of uterus    . Cystoscopy  10/17/2011    Procedure: CYSTOSCOPY;  Surgeon: Jerene Bears, MD;  Location: [facility];  Service: Gynecology;;  . Abdominal hysterectomy  09/2011    Current Outpatient Prescriptions  Medication Sig Dispense Refill  . aspirin EC 81 MG tablet Take 81 mg by  mouth daily.      . cetirizine (ZYRTEC) 10 MG tablet Take 10 mg by mouth daily.        . DULoxetine (CYMBALTA) 60 MG capsule Take 1 capsule (60 mg total) by mouth daily.  90 capsule  3  . estradiol (MINIVELLE) 0.1 MG/24HR Place 1 patch onto the skin 2 (two) times a week.      . pantoprazole (PROTONIX) 40 MG tablet Take 1 tablet (40 mg total) by mouth daily.  90 tablet  3  . Testosterone 10 MG/ACT (2%) GEL Place 5 % onto the skin daily. Don't use for 1 week following procedure on      . triamcinolone cream (KENALOG) 0.1 % Apply topically 2 (two) times daily.  30 g  0  . [DISCONTINUED] progesterone (PROMETRIUM) 200 MG capsule Take 200 mg by mouth daily.         No current facility-administered medications for this visit.    Family History  Problem Relation Age of Onset  . Arthritis Mother   . Hyperlipidemia Mother   . Heart disease Mother   . Arthritis Father   . Lung cancer Father   . Heart disease Father   . Hypertension Father   . Diabetes Father   . Colon cancer Paternal Aunt   .  Kidney disease Paternal Grandmother   . Kidney disease Paternal Grandfather     ROS:  Pertinent items are noted in HPI.  Otherwise, a comprehensive ROS was negative.  Exam:   BP 120/66  Pulse 84  Ht 5\' 1"  (1.549 m)  Wt 168 lb (76.204 kg)  BMI 31.76 kg/m2  LMP 09/24/2011  Weight change: +4 lbs   Height: 5\' 1"  (154.9 cm)  Ht Readings from Last 3 Encounters:  12/12/12 5\' 1"  (1.549 m)  05/22/12 5' (1.524 m)  10/18/11 5' (1.524 m)    General appearance: alert, cooperative and appears stated age Head: Normocephalic, without obvious abnormality, atraumatic Neck: no adenopathy, supple, symmetrical, trachea midline and thyroid normal to inspection and palpation Lungs: clear to auscultation bilaterally Breasts: normal appearance, no masses or tenderness Heart: regular rate and rhythm Abdomen: soft, non-tender; bowel sounds normal; no masses,  no organomegaly Extremities: extremities normal,  atraumatic, no cyanosis or edema Skin: Skin color, texture, turgor normal. No rashes or lesions Lymph nodes: Cervical, supraclavicular, and axillary nodes normal. No abnormal inguinal nodes palpated Neurologic: Grossly normal   Pelvic: External genitalia:  no lesions              Urethra:  normal appearing urethra with no masses, tenderness or lesions              Bartholins and Skenes: normal                 Vagina: normal appearing vagina with normal color and discharge, no lesions              Cervix: absent              Pap taken: no Bimanual Exam:  Uterus:  uterus absent              Adnexa: no mass, fullness, tenderness               Rectovaginal: Confirms               Anus:  normal sphincter tone, no lesions  A:  Well Woman with normal exam S/p TLH/BSO, on HRT H/O depression H/O libido issues, using topical testosterone  P:   Mammogram yearly.   pap smear not indicated due to h/o TLH, negative cervical pathology Rx for minivelle 0.1mg  patches twice weekly to skin #24/4 RF. Topical testosterone rx given for six months. return annually or prn  An After Visit Summary was printed and given to the patient.

## 2013-05-26 ENCOUNTER — Encounter: Payer: 59 | Admitting: Internal Medicine

## 2013-05-29 ENCOUNTER — Other Ambulatory Visit: Payer: Self-pay

## 2013-06-17 ENCOUNTER — Ambulatory Visit (INDEPENDENT_AMBULATORY_CARE_PROVIDER_SITE_OTHER): Payer: 59 | Admitting: Internal Medicine

## 2013-06-17 ENCOUNTER — Encounter: Payer: Self-pay | Admitting: Internal Medicine

## 2013-06-17 VITALS — BP 120/82 | HR 95 | Temp 98.2°F | Ht 60.0 in | Wt 171.8 lb

## 2013-06-17 DIAGNOSIS — Z0000 Encounter for general adult medical examination without abnormal findings: Secondary | ICD-10-CM

## 2013-06-17 MED ORDER — CICLOPIROX 8 % EX SOLN: Freq: Every day | CUTANEOUS | Status: DC

## 2013-06-17 NOTE — Progress Notes (Signed)
Pre-visit discussion using our clinic review tool. No additional management support is needed unless otherwise documented below in the visit note.  

## 2013-06-17 NOTE — Patient Instructions (Addendum)
It was good to see you today.  We have reviewed your prior records including labs and tests today  Health Maintenance reviewed - all recommended immunizations and age-appropriate screenings are up-to-date.  Test(s) ordered today. Your results will be released to MyChart (or called to you) after review, usually within 72hours after test completion. If any changes need to be made, you will be notified at that same time.  Medications reviewed and updated - Use PenLac for nail fungus - no other changes recommended at this time. Refills as requested today  Your prescription(s) have been submitted to your pharmacy. Please take as directed and contact our office if you believe you are having problem(s) with the medication(s).  Work on lifestyle changes as discussed (low fat, low carb, increased protein diet; improved exercise efforts; weight loss) to control sugar, blood pressure and cholesterol levels and/or reduce risk of developing other medical problems. Look into [email or web address] or other type of food journal to assist you in this process.  Please schedule followup in 12 months for annual visit and labs, call sooner if problems.  Health Maintenance, Female A healthy lifestyle and preventative care can promote health and wellness.  Maintain regular health, dental, and eye exams.  Eat a healthy diet. Foods like vegetables, fruits, whole grains, low-fat dairy products, and lean protein foods contain the nutrients you need without too many calories. Decrease your intake of foods high in solid fats, added sugars, and salt. Get information about a proper diet from your caregiver, if necessary.  Regular physical exercise is one of the most important things you can do for your health. Most adults should get at least 150 minutes of moderate-intensity exercise (any activity that increases your heart rate and causes you to sweat) each week. In addition, most adults need muscle-strengthening exercises on 2  or more days a week.   Maintain a healthy weight. The body mass index (BMI) is a screening tool to identify possible weight problems. It provides an estimate of body fat based on height and weight. Your caregiver can help determine your BMI, and can help you achieve or maintain a healthy weight. For adults 20 years and older:  A BMI below 18.5 is considered underweight.  A BMI of 18.5 to 24.9 is normal.  A BMI of 25 to 29.9 is considered overweight.  A BMI of 30 and above is considered obese.  Maintain normal blood lipids and cholesterol by exercising and minimizing your intake of saturated fat. Eat a balanced diet with plenty of fruits and vegetables. Blood tests for lipids and cholesterol should begin at age 26 and be repeated every 5 years. If your lipid or cholesterol levels are high, you are over 50, or you are a high risk for heart disease, you may need your cholesterol levels checked more frequently.Ongoing high lipid and cholesterol levels should be treated with medicines if diet and exercise are not effective.  If you smoke, find out from your caregiver how to quit. If you do not use tobacco, do not start.  Lung cancer screening is recommended for adults aged 75 80 years who are at high risk for developing lung cancer because of a history of smoking. Yearly low-dose computed tomography (CT) is recommended for people who have at least a 30-pack-year history of smoking and are a current smoker or have quit within the past 15 years. A pack year of smoking is smoking an average of 1 pack of cigarettes a day for 1 year (for example: 1  pack a day for 30 years or 2 packs a day for 15 years). Yearly screening should continue until the smoker has stopped smoking for at least 15 years. Yearly screening should also be stopped for people who develop a health problem that would prevent them from having lung cancer treatment.  If you are pregnant, do not drink alcohol. If you are breastfeeding, be  very cautious about drinking alcohol. If you are not pregnant and choose to drink alcohol, do not exceed 1 drink per day. One drink is considered to be 12 ounces (355 mL) of beer, 5 ounces (148 mL) of wine, or 1.5 ounces (44 mL) of liquor.  Avoid use of street drugs. Do not share needles with anyone. Ask for help if you need support or instructions about stopping the use of drugs.  High blood pressure causes heart disease and increases the risk of stroke. Blood pressure should be checked at least every 1 to 2 years. Ongoing high blood pressure should be treated with medicines, if weight loss and exercise are not effective.  If you are 96 to 58 years old, ask your caregiver if you should take aspirin to prevent strokes.  Diabetes screening involves taking a blood sample to check your fasting blood sugar level. This should be done once every 3 years, after age 53, if you are within normal weight and without risk factors for diabetes. Testing should be considered at a younger age or be carried out more frequently if you are overweight and have at least 1 risk factor for diabetes.  Breast cancer screening is essential preventative care for women. You should practice "breast self-awareness." This means understanding the normal appearance and feel of your breasts and may include breast self-examination. Any changes detected, no matter how small, should be reported to a caregiver. Women in their 21s and 30s should have a clinical breast exam (CBE) by a caregiver as part of a regular health exam every 1 to 3 years. After age 74, women should have a CBE every year. Starting at age 66, women should consider having a mammogram (breast X-ray) every year. Women who have a family history of breast cancer should talk to their caregiver about genetic screening. Women at a high risk of breast cancer should talk to their caregiver about having an MRI and a mammogram every year.  Breast cancer gene (BRCA)-related cancer  risk assessment is recommended for women who have family members with BRCA-related cancers. BRCA-related cancers include breast, ovarian, tubal, and peritoneal cancers. Having family members with these cancers may be associated with an increased risk for harmful changes (mutations) in the breast cancer genes BRCA1 and BRCA2. Results of the assessment will determine the need for genetic counseling and BRCA1 and BRCA2 testing.  The Pap test is a screening test for cervical cancer. Women should have a Pap test starting at age 34. Between ages 66 and 71, Pap tests should be repeated every 2 years. Beginning at age 37, you should have a Pap test every 3 years as long as the past 3 Pap tests have been normal. If you had a hysterectomy for a problem that was not cancer or a condition that could lead to cancer, then you no longer need Pap tests. If you are between ages 62 and 38, and you have had normal Pap tests going back 10 years, you no longer need Pap tests. If you have had past treatment for cervical cancer or a condition that could lead to cancer, you  need Pap tests and screening for cancer for at least 20 years after your treatment. If Pap tests have been discontinued, risk factors (such as a new sexual partner) need to be reassessed to determine if screening should be resumed. Some women have medical problems that increase the chance of getting cervical cancer. In these cases, your caregiver may recommend more frequent screening and Pap tests.  The human papillomavirus (HPV) test is an additional test that may be used for cervical cancer screening. The HPV test looks for the virus that can cause the cell changes on the cervix. The cells collected during the Pap test can be tested for HPV. The HPV test could be used to screen women aged 63 years and older, and should be used in women of any age who have unclear Pap test results. After the age of 27, women should have HPV testing at the same frequency as a Pap  test.  Colorectal cancer can be detected and often prevented. Most routine colorectal cancer screening begins at the age of 42 and continues through age 89. However, your caregiver may recommend screening at an earlier age if you have risk factors for colon cancer. On a yearly basis, your caregiver may provide home test kits to check for hidden blood in the stool. Use of a small camera at the end of a tube, to directly examine the colon (sigmoidoscopy or colonoscopy), can detect the earliest forms of colorectal cancer. Talk to your caregiver about this at age 58, when routine screening begins. Direct examination of the colon should be repeated every 5 to 10 years through age 35, unless early forms of pre-cancerous polyps or small growths are found.  Hepatitis C blood testing is recommended for all people born from [birth year] through 1966 and any individual with known risks for hepatitis C.  Practice safe sex. Use condoms and avoid high-risk sexual practices to reduce the spread of sexually transmitted infections (STIs). Sexually active women aged 59 and younger should be checked for Chlamydia, which is a common sexually transmitted infection. Older women with new or multiple partners should also be tested for Chlamydia. Testing for other STIs is recommended if you are sexually active and at increased risk.  Osteoporosis is a disease in which the bones lose minerals and strength with aging. This can result in serious bone fractures. The risk of osteoporosis can be identified using a bone density scan. Women ages 66 and over and women at risk for fractures or osteoporosis should discuss screening with their caregivers. Ask your caregiver whether you should be taking a calcium supplement or vitamin D to reduce the rate of osteoporosis.  Menopause can be associated with physical symptoms and risks. Hormone replacement therapy is available to decrease symptoms and risks. You should talk to your caregiver about  whether hormone replacement therapy is right for you.  Use sunscreen. Apply sunscreen liberally and repeatedly throughout the day. You should seek shade when your shadow is shorter than you. Protect yourself by wearing long sleeves, pants, a wide-brimmed hat, and sunglasses year round, whenever you are outdoors.  Notify your caregiver of new moles or changes in moles, especially if there is a change in shape or color. Also notify your caregiver if a mole is larger than the size of a pencil eraser.  Stay current with your immunizations. Document Released: 01/23/2011 Document Revised: 11/04/2012 Document Reviewed: 01/23/2011 [facility] Patient Information 2014 [place], [place].

## 2013-06-17 NOTE — Progress Notes (Signed)
Subjective:    Patient ID: Sarah Mullen, female    DOB: Jan 07, 1956, 57 y.o.   MRN: [id number]  HPI patient is here today for annual physical. Patient feels well overall ?nail fungus treatment options   Past Medical History  Diagnosis Date  . Depression   . Diverticulitis   . Chronic headaches   . GERD (gastroesophageal reflux disease)   . Migraines   . Allergic rhinitis, cause unspecified   . Blood transfusion     iron transfusion- [place]-2009  . History of sinus tachycardia     copy of holter monitor results on chart   Family History  Problem Relation Age of Onset  . Arthritis Mother   . Hyperlipidemia Mother   . Heart disease Mother   . Arthritis Father   . Lung cancer Father   . Heart disease Father   . Hypertension Father   . Diabetes Father   . Colon cancer Paternal Aunt   . Kidney disease Paternal Grandmother   . Kidney disease Paternal Grandfather    History  Substance Use Topics  . Smoking status: Former Smoker    Types: Cigarettes    Quit date: 10/09/2001  . Smokeless tobacco: Never Used  . Alcohol Use: No    Review of Systems  Constitutional: Negative for fatigue and unexpected weight change.  Respiratory: Negative for cough, shortness of breath and wheezing.   Cardiovascular: Negative for chest pain, palpitations and leg swelling.  Gastrointestinal: Negative for nausea, abdominal pain and diarrhea.  Neurological: Negative for dizziness, weakness, light-headedness and headaches.  Psychiatric/Behavioral: Negative for dysphoric mood. The patient is not nervous/anxious.   All other systems reviewed and are negative.       Objective:   Physical Exam BP 120/82  Pulse 95  Temp(Src) 98.2 F (36.8 C) (Oral)  Ht 5' (1.524 m)  Wt 171 lb 12.8 oz (77.928 kg)  BMI 33.55 kg/m2  SpO2 95%  LMP 09/24/2011 Wt Readings from Last 3 Encounters:  06/17/13 171 lb 12.8 oz (77.928 kg)  12/12/12 168 lb (76.204 kg)  09/24/12 164 lb (74.39 kg)    Constitutional: She is overweight, but appears well-developed and well-nourished. No distress.  HENT: Head: Normocephalic and atraumatic. Ears; B TMs ok, no erythema or effusion; Nose: Nose normal. Mouth/Throat: Oropharynx is clear and moist. No oropharyngeal exudate.  Eyes: Conjunctivae and EOM are normal. Pupils are equal, round, and reactive to light. No scleral icterus.  Neck: Normal range of motion. Neck supple. No JVD or LAD present. No thyromegaly present.  Cardiovascular: Normal rate, regular rhythm and normal heart sounds.  No murmur heard. No BLE edema. Pulmonary/Chest: Effort normal and breath sounds normal. No respiratory distress. She has no wheezes.  Abdominal: Soft. Bowel sounds are normal. She exhibits no distension. There is no tenderness.  GU: defer to gyn Musculoskeletal: Normal range of motion, no joint effusions. No gross deformities Neurological: She is alert and oriented to person, place, and time. No cranial nerve deficit. Coordination normal.  Skin: nail fungus changes B great toenails - skin is warm and dry. No rash noted. No erythema.  Psychiatric: She has a normal mood and affect. Her behavior is normal. Judgment and thought content normal.   Lab Results  Component Value Date   WBC 7.8 05/22/2012   HGB 14.5 05/22/2012   HCT 44.3 05/22/2012   PLT 305.0 05/22/2012   CHOL 207* 05/22/2012   TRIG 136.0 05/22/2012   HDL 43.40 05/22/2012   LDLDIRECT 149.3 05/22/2012  ALT 57* 05/22/2012   AST 29 05/22/2012   NA 140 05/22/2012   K 4.6 05/22/2012   CL 106 05/22/2012   CREATININE 0.9 05/22/2012   BUN 19 05/22/2012   CO2 29 05/22/2012   TSH 1.56 05/22/2012        Assessment & Plan:   CPX - v70.0 - Patient has been counseled on age-appropriate routine health concerns for screening and prevention. These are reviewed and up-to-date. Immunizations are up-to-date or declined. Labs ordered and reviewed.  Onychomycosis B toenails - prior treatment by derm with po  Lamisil - brief improvement - we reviewed potential risk/benefit and possible side effects - pt will use topical PenLac instead - erx done

## 2013-07-21 ENCOUNTER — Other Ambulatory Visit (INDEPENDENT_AMBULATORY_CARE_PROVIDER_SITE_OTHER): Payer: 59

## 2013-07-21 ENCOUNTER — Encounter: Payer: Self-pay | Admitting: Internal Medicine

## 2013-07-21 DIAGNOSIS — Z0000 Encounter for general adult medical examination without abnormal findings: Secondary | ICD-10-CM

## 2013-07-21 LAB — LIPID PANEL
HDL: 46.9 mg/dL (ref >39.00)
LDL Cholesterol: 120 mg/dL — ABNORMAL HIGH (ref 0–99)
Triglycerides: 102 mg/dL (ref 0.0–149.0)
VLDL: 20.4 mg/dL (ref 0.0–40.0)

## 2013-07-21 LAB — HEPATIC FUNCTION PANEL
ALT: 55 U/L — ABNORMAL HIGH (ref 0–35)
Albumin: 4.6 g/dL (ref 3.5–5.2)
Bilirubin, Direct: 0 mg/dL (ref 0.0–0.3)
Total Bilirubin: 0.7 mg/dL (ref 0.3–1.2)
Total Protein: 7.3 g/dL (ref 6.0–8.3)

## 2013-07-21 LAB — BASIC METABOLIC PANEL
Calcium: 9.5 mg/dL (ref 8.4–10.5)
Chloride: 107 mEq/L (ref 96–112)
GFR: 71.06 mL/min (ref >60.00)
Glucose, Bld: 107 mg/dL — ABNORMAL HIGH (ref 70–99)
Potassium: 4.5 mEq/L (ref 3.5–5.1)
Sodium: 143 mEq/L (ref 135–145)

## 2013-07-21 LAB — URINALYSIS, ROUTINE W REFLEX MICROSCOPIC
Bilirubin Urine: NEGATIVE
Ketones, ur: NEGATIVE
Nitrite: NEGATIVE
Total Protein, Urine: NEGATIVE
Urine Glucose: NEGATIVE
pH: 6 (ref 5.0–8.0)

## 2013-07-21 LAB — CBC WITH DIFFERENTIAL/PLATELET
Basophils Absolute: 0 10*3/uL (ref 0.0–0.1)
Basophils Relative: 0.4 % (ref 0.0–3.0)
Eosinophils Absolute: 0.3 10*3/uL (ref 0.0–0.7)
Eosinophils Relative: 3.3 % (ref 0.0–5.0)
HCT: 45.4 % (ref 36.0–46.0)
Hemoglobin: 14.8 g/dL (ref 12.0–15.0)
Lymphs Abs: 2.6 10*3/uL (ref 0.7–4.0)
MCV: 88.8 fl (ref 78.0–100.0)
Monocytes Relative: 4.2 % (ref 3.0–12.0)
Neutro Abs: 5.2 10*3/uL (ref 1.4–7.7)
RBC: 5.11 Mil/uL (ref 3.87–5.11)
RDW: 13.8 % (ref 11.5–14.6)
WBC: 8.4 10*3/uL (ref 4.5–10.5)

## 2013-07-21 LAB — TSH: TSH: 1.36 u[IU]/mL (ref 0.35–5.50)

## 2013-07-25 ENCOUNTER — Telehealth: Payer: Self-pay

## 2013-07-25 ENCOUNTER — Other Ambulatory Visit: Payer: Self-pay | Admitting: Internal Medicine

## 2013-07-25 MED ORDER — PANTOPRAZOLE SODIUM 40 MG PO TBEC: 40.0000 mg | DELAYED_RELEASE_TABLET | Freq: Every day | ORAL | Status: DC

## 2013-07-25 MED ORDER — DULOXETINE HCL 60 MG PO CPEP: 60.0000 mg | ORAL_CAPSULE | Freq: Every day | ORAL | Status: DC

## 2013-07-25 NOTE — Telephone Encounter (Signed)
Patient called requesting refill to [facility] for Cymbalta and Protonix.

## 2013-07-28 ENCOUNTER — Other Ambulatory Visit: Payer: Self-pay

## 2013-07-28 NOTE — Telephone Encounter (Signed)
Patient had AEX on 12/12/12, was last written then for #15/5RF. Please advise if ok to refill this or does she need to have level checked first.

## 2013-07-30 MED ORDER — NONFORMULARY OR COMPOUNDED ITEM: Status: DC

## 2013-07-30 NOTE — Telephone Encounter (Signed)
Dr. Sabra Heck please advise patient needing refill

## 2013-07-30 NOTE — Telephone Encounter (Signed)
Patient calling to check on status of refill request for testosterone? She hopes to have this today as she has been "trying to get it refill for a week." Patient aware Dr. Sabra Heck is out of the office today but hopes this can be refilled anyway.

## 2013-07-30 NOTE — Telephone Encounter (Signed)
Patient calling to check on status of

## 2013-07-30 NOTE — Telephone Encounter (Signed)
Rx printed, faxed to [facility]; patient aware.

## 2013-12-26 ENCOUNTER — Other Ambulatory Visit: Payer: Self-pay | Admitting: Obstetrics & Gynecology

## 2013-12-26 NOTE — Telephone Encounter (Signed)
Last AEX and refill 12/12/12 #24/4 refills Next appt 01/29/14 Last MMG 05/2012 BIRADS2  Please approve or deny Rx.

## 2014-01-29 ENCOUNTER — Ambulatory Visit: Payer: 59 | Admitting: Obstetrics & Gynecology

## 2014-02-03 ENCOUNTER — Ambulatory Visit (INDEPENDENT_AMBULATORY_CARE_PROVIDER_SITE_OTHER): Payer: 59 | Admitting: Obstetrics & Gynecology

## 2014-02-03 ENCOUNTER — Encounter: Payer: Self-pay | Admitting: Obstetrics & Gynecology

## 2014-02-03 VITALS — BP 110/70 | HR 96 | Resp 18 | Ht 60.75 in | Wt 169.0 lb

## 2014-02-03 DIAGNOSIS — R6882 Decreased libido: Secondary | ICD-10-CM

## 2014-02-03 DIAGNOSIS — Z0000 Encounter for general adult medical examination without abnormal findings: Secondary | ICD-10-CM

## 2014-02-03 DIAGNOSIS — E2839 Other primary ovarian failure: Secondary | ICD-10-CM

## 2014-02-03 DIAGNOSIS — Z01419 Encounter for gynecological examination (general) (routine) without abnormal findings: Secondary | ICD-10-CM

## 2014-02-03 MED ORDER — ESTRADIOL 0.1 MG/24HR TD PTTW: MEDICATED_PATCH | TRANSDERMAL | Status: DC

## 2014-02-03 MED ORDER — NONFORMULARY OR COMPOUNDED ITEM: Status: DC

## 2014-02-03 NOTE — Progress Notes (Signed)
58 y.o. G7P1 SingleCaucasianF here for annual exam.  She and significant other are doing really well.  She's getting "bugged" to exercise more.  Has finished her bachelor's degree.  Working towards being a Public affairs consultant.    Patient's last menstrual period was 09/24/2011.          Sexually active: Yes.    The current method of family planning is status post hysterectomy.    Exercising: No.  The patient does not participate in regular exercise at present. Smoker:  no  Health Maintenance: Pap: 2011 - Normal History of abnormal Pap:  yes MMG: 12/2013 BIRADS2: Benign  Colonoscopy: 2010 BMD:  Around 2010 TDaP: 2012 Screening Labs: PCP, Hb today: PCP, Urine today: PCP   reports that she quit smoking about 12 years ago. Her smoking use included Cigarettes. She smoked 0.00 packs per day. She has never used smokeless tobacco. She reports that she does not drink alcohol or use illicit drugs.  Past Medical History  Diagnosis Date  . Depression   . Diverticulitis   . Chronic headaches   . GERD (gastroesophageal reflux disease)   . Migraines   . Allergic rhinitis, cause unspecified   . Blood transfusion     iron transfusion- [place]-2009  . History of sinus tachycardia     copy of holter monitor results on chart    Past Surgical History  Procedure Laterality Date  . Tonsillectomy    . Breast biopsy    . Cesarean section    . Cerclage removal    . Dilation and curettage of uterus    . Cystoscopy  10/17/2011    Procedure: CYSTOSCOPY;  Surgeon: Megan Salon, MD;  Location: [facility];  Service: Gynecology;;  . Abdominal hysterectomy  09/2011    Current Outpatient Prescriptions  Medication Sig Dispense Refill  . aspirin EC 81 MG tablet Take 81 mg by mouth daily.      . cetirizine (ZYRTEC) 10 MG tablet Take 10 mg by mouth daily.        . DULoxetine (CYMBALTA) 60 MG capsule TAKE 1 CAPSULE BY MOUTH DAILY.  90 capsule  3  . MINIVELLE 0.1 MG/24HR patch PLACE 1 PATCH ONTO THE  SKIN 2 TIMES A WEEK.  24 patch  4  . NONFORMULARY OR COMPOUNDED ITEM Testosterone HRT (3ML) 1% cream, Apply 1/2 ml topically every morning as directed  15 each  5  . pantoprazole (PROTONIX) 40 MG tablet TAKE 1 TABLET BY MOUTH DAILY.  90 tablet  3  . [DISCONTINUED] progesterone (PROMETRIUM) 200 MG capsule Take 200 mg by mouth daily.         No current facility-administered medications for this visit.    Family History  Problem Relation Age of Onset  . Arthritis Mother   . Hyperlipidemia Mother   . Heart disease Mother   . Arthritis Father   . Lung cancer Father   . Heart disease Father   . Hypertension Father   . Diabetes Father   . Colon cancer Paternal Aunt   . Kidney disease Paternal Grandmother   . Kidney disease Paternal Grandfather     ROS:  Pertinent items are noted in HPI.  Otherwise, a comprehensive ROS was negative.  Exam:   BP 110/70  Pulse 96  Resp 18  Ht 5' 0.75" (1.543 m)  Wt 169 lb (76.658 kg)  BMI 32.20 kg/m2  LMP 09/24/2011    Height: 5' 0.75" (154.3 cm)  Ht Readings from Last 3  Encounters:  02/03/14 5' 0.75" (1.543 m)  06/17/13 5' (1.524 m)  12/12/12 5\' 1"  (1.549 m)    General appearance: alert, cooperative and appears stated age Head: Normocephalic, without obvious abnormality, atraumatic Neck: no adenopathy, supple, symmetrical, trachea midline and thyroid normal to inspection and palpation Lungs: clear to auscultation bilaterally Breasts: normal appearance, no masses or tenderness Heart: regular rate and rhythm Abdomen: soft, non-tender; bowel sounds normal; no masses,  no organomegaly Extremities: extremities normal, atraumatic, no cyanosis or edema Skin: Skin color, texture, turgor normal. No rashes or lesions Lymph nodes: Cervical, supraclavicular, and axillary nodes normal. No abnormal inguinal nodes palpated Neurologic: Grossly normal   Pelvic: External genitalia:  no lesions              Urethra:  normal appearing urethra with no masses,  tenderness or lesions              Bartholins and Skenes: normal                 Vagina: normal appearing vagina with normal color and discharge, no lesions              Cervix: absent              Pap taken: No. Bimanual Exam:  Uterus:  uterus absent              Adnexa: no mass, fullness, tenderness               Rectovaginal: Confirms               Anus:  normal sphincter tone, no lesions  A:  Well Woman with normal exam  S/p TLH/BSO, on HRT  H/O depression  H/O libido issues, using topical testosterone   P: Mammogram yearly.  pap smear not indicated due to h/o TLH, negative cervical pathology  Rx for minivelle 0.1mg  patches twice weekly to skin #24/4 RF.  Topical testosterone rx 1% cream, 1/2 ml daily.  #15 gm/5RF will be faxed to pharmacy.  Pt will return for total testosterone level after she has been using regularly.  Order placed.  return annually or prn  An After Visit Summary was printed and given to the patient.

## 2014-05-25 ENCOUNTER — Encounter: Payer: Self-pay | Admitting: Obstetrics & Gynecology

## 2014-08-03 ENCOUNTER — Encounter: Payer: Self-pay | Admitting: Obstetrics & Gynecology

## 2014-08-03 NOTE — Telephone Encounter (Signed)
Called patient and response mychart message sent.  Patient has read response mychart message.   Will close encounter.

## 2014-08-03 NOTE — Telephone Encounter (Signed)
Dr. Sabra Heck,   Okay to place order for generic Vivelle-dot 0.1 mg. Patient would like to change from [place] to generic due to cost. Order pended.   Also patient requesting refill of Testosterone to [facility]. Will she need lab draw first?

## 2014-08-04 ENCOUNTER — Other Ambulatory Visit: Payer: Self-pay | Admitting: *Deleted

## 2014-08-04 NOTE — Telephone Encounter (Signed)
Medication Fax refill request: Testosterone (55ml) 1% cream Last AEX:  02/03/14 with Dr. Sabra Heck Next AEX: 02/19/15 with Dr. Sabra Heck Last MMG (if hormonal medication request):  12/26/13 Bi-Rads 2: Benign Refill authorized: #15/5 rfs, please advise.

## 2014-08-05 MED ORDER — ESTRADIOL 0.1 MG/24HR TD PTTW: 1.0000 | MEDICATED_PATCH | TRANSDERMAL | Status: DC

## 2014-08-05 NOTE — Telephone Encounter (Addendum)
Patient advised refill was placed for generic estradiol patch.  Advised there is a refill request pending for testosterone. Waiting for approval or denial. Unsure if needs labs prior. Advised patient would be contacted.  Detailed message left, okay per designated party release form.

## 2014-08-06 MED ORDER — NONFORMULARY OR COMPOUNDED ITEM: Status: DC

## 2014-08-06 NOTE — Telephone Encounter (Signed)
No testosterone level testing needed.  Done 7/15.  Thanks.

## 2014-08-07 NOTE — Telephone Encounter (Signed)
RX printed, signed by Dr. Sabra Heck and faxed by Elie Goody, CMA

## 2014-08-11 ENCOUNTER — Other Ambulatory Visit: Payer: Self-pay | Admitting: Internal Medicine

## 2014-08-13 ENCOUNTER — Encounter: Payer: Self-pay | Admitting: Internal Medicine

## 2014-08-17 ENCOUNTER — Telehealth: Payer: Self-pay | Admitting: Internal Medicine

## 2014-08-17 ENCOUNTER — Encounter: Payer: Self-pay | Admitting: Internal Medicine

## 2014-08-17 NOTE — Telephone Encounter (Signed)
Patient was a no show. pelase advise

## 2014-08-19 NOTE — Telephone Encounter (Signed)
noted 

## 2015-01-18 ENCOUNTER — Encounter: Payer: Self-pay | Admitting: Obstetrics & Gynecology

## 2015-01-18 ENCOUNTER — Other Ambulatory Visit: Payer: Self-pay | Admitting: Emergency Medicine

## 2015-01-18 MED ORDER — MINIVELLE 0.1 MG/24HR TD PTTW: 1.0000 | MEDICATED_PATCH | TRANSDERMAL | Status: DC

## 2015-01-18 NOTE — Telephone Encounter (Signed)
Call to patient regarding mychart messages. She is requesting to change back to brand only Minivelle. She thinks that she has had an increase of symptoms since changing to generic Vivelle Patch in January.  Patient states she feels like she did when starting menopause, feeling fatigued and "wiped out" and increase in migraine headaches, with difficulty with focus and concentration.  Patient has annual appointment with Dr. Sabra Heck 02/19/15 and would like to try the brand Minivelle first to see if that helps with her symptoms and then will discuss with Dr. Sabra Heck at annual exam.   Patient declines earlier appointment with Dr. Sabra Heck. Advised patient that her Mother, Ulyess Blossom did call here on her behalf to request that she receive an earlier appointment. Patient advised that no clinical information was given to her mother as her mother was not on her designated party release form.  Patient aware.   Advised patient would have Dr. Sabra Heck review request and if okay to make change from Generic Vivelle to Brand Minivelle 0.1 mg patch would send in new prescription. Patient agreeable.  She requests savings card, advised it would have to activated, but she can pick one up from office.   Order pended for one month if Dr. Sabra Heck agreeable.

## 2015-01-18 NOTE — Telephone Encounter (Signed)
===  View-only below this line===   ----- Message -----    From: Raiford Noble    Sent: 01/18/2015  9:01 AM EDT      To: Lyman Speller, MD Subject: Non-Urgent Medical Question  I'm having symptoms of decreased estrogen. I started on the generic patch a while ago and find that I am tired and having trouble functioning. Increased migraine headaches. [facility] patch and I will download a savings coupon. I have an appt next month and we can see if it makes a difference by then. Thank you. I haven't been able to keep up with school related to the symptoms. If I need a note could you write one for me? Thanks have a great day.  ===View-only below this line===   ----- Message -----    From: Raiford Noble    Sent: 01/18/2015  9:52 AM EDT      To: Lyman Speller, MD Subject: Non-Urgent Medical Question  If you order minivelle can I pick up the coupon at your office?

## 2015-01-18 NOTE — Telephone Encounter (Signed)
Telephone call for triage created to discuss message with patient and disposition as appropriate.   

## 2015-01-19 NOTE — Telephone Encounter (Signed)
Patient notified that rx changed has been approved by Dr. Sabra Heck and sent to pharmacy. She will follow up in one month.  Routing to provider for final review. Patient agreeable to disposition. Will close encounter.

## 2015-01-19 NOTE — Addendum Note (Signed)
Addended by: Michele Mcalpine on: 01/19/2015 08:44 AM   Modules accepted: Orders, Medications

## 2015-02-16 ENCOUNTER — Other Ambulatory Visit (INDEPENDENT_AMBULATORY_CARE_PROVIDER_SITE_OTHER): Payer: 59

## 2015-02-16 DIAGNOSIS — R6882 Decreased libido: Secondary | ICD-10-CM

## 2015-02-17 ENCOUNTER — Encounter: Payer: Self-pay | Admitting: Obstetrics & Gynecology

## 2015-02-17 LAB — TESTOSTERONE: Testosterone: 60 ng/dL (ref 10–70)

## 2015-02-19 ENCOUNTER — Ambulatory Visit (INDEPENDENT_AMBULATORY_CARE_PROVIDER_SITE_OTHER): Payer: 59 | Admitting: Obstetrics & Gynecology

## 2015-02-19 ENCOUNTER — Encounter: Payer: Self-pay | Admitting: Obstetrics & Gynecology

## 2015-02-19 VITALS — BP 148/62 | HR 60 | Resp 20 | Ht 60.5 in | Wt 171.0 lb

## 2015-02-19 DIAGNOSIS — Z01419 Encounter for gynecological examination (general) (routine) without abnormal findings: Secondary | ICD-10-CM

## 2015-02-19 MED ORDER — MINIVELLE 0.1 MG/24HR TD PTTW: 1.0000 | MEDICATED_PATCH | TRANSDERMAL | Status: DC

## 2015-02-19 NOTE — Progress Notes (Signed)
59 y.o. G7P1 SingleCaucasianF here for annual exam.  Reports she started graduate school.  Working on [employer].  Had some trouble with feeling like she could not focus.  Feels like the change to the generic HRT patch played a roll in this.  Is feeling much better now.  Has considered PACU job at [employer].    Denies vaginal bleeding.    PCP:  Dr. Asa Lente.  Appt in October.  Will do blood work then.  Patient's last menstrual period was 09/24/2011.          Sexually active: Yes.    The current method of family planning is status post hysterectomy.    Exercising: No.  not regularly Smoker:  Former smoker-years ago  Health Maintenance: Pap:  2011-WNL History of abnormal Pap:  yes MMG:  02/05/15 3D-BiRads 2-normal Colonoscopy:  2010-repeat in 10 years BMD:   02/05/15-[facility], -0.7 TDaP:  2012 Screening Labs: PCP, Hb today: PCP, Urine today: PCP   reports that she quit smoking about 13 years ago. Her smoking use included Cigarettes. She has never used smokeless tobacco. She reports that she does not drink alcohol or use illicit drugs.  Past Medical History  Diagnosis Date  . Depression   . Diverticulitis   . Chronic headaches   . GERD (gastroesophageal reflux disease)   . Migraines   . Allergic rhinitis, cause unspecified   . Blood transfusion     iron transfusion- [place]-2009  . History of sinus tachycardia     copy of holter monitor results on chart  . Heart murmur     diag with echo-physiologic murmur    Past Surgical History  Procedure Laterality Date  . Tonsillectomy    . Breast biopsy    . Cesarean section    . Cerclage removal    . Dilation and curettage of uterus    . Cystoscopy  10/17/2011    Procedure: CYSTOSCOPY;  Surgeon: Megan Salon, MD;  Location: [facility];  Service: Gynecology;;  . Abdominal hysterectomy  09/2011    Current Outpatient Prescriptions  Medication Sig Dispense Refill  . aspirin EC 81 MG tablet Take 81 mg by mouth daily.    . cetirizine  (ZYRTEC) 10 MG tablet Take 10 mg by mouth daily.      . DULoxetine (CYMBALTA) 60 MG capsule Take 1 capsule (60 mg total) by mouth daily. 90 capsule 3  . MINIVELLE 0.1 MG/24HR patch Place 1 patch (0.1 mg total) onto the skin 2 (two) times a week. 8 patch 0  . NONFORMULARY OR COMPOUNDED ITEM Testosterone HRT (3ML) 1% cream, Apply 1/2 ml topically every morning as directed.  #60 grams 1 each 5  . pantoprazole (PROTONIX) 40 MG tablet Take 1 tablet (40 mg total) by mouth daily. 90 tablet 3  . [DISCONTINUED] progesterone (PROMETRIUM) 200 MG capsule Take 200 mg by mouth daily.       No current facility-administered medications for this visit.    Family History  Problem Relation Age of Onset  . Arthritis Mother   . Hyperlipidemia Mother   . Heart disease Mother   . Arthritis Father   . Lung cancer Father   . Heart disease Father   . Hypertension Father   . Diabetes Father   . Colon cancer Paternal Aunt   . Kidney disease Paternal Grandmother   . Kidney disease Paternal Grandfather     ROS:  Pertinent items are noted in HPI.  Otherwise, a comprehensive ROS was negative.  Exam:   BP 148/62 mmHg  Pulse 60  Resp 20  Ht 5' 0.5" (1.537 m)  Wt 171 lb (77.565 kg)  BMI 32.83 kg/m2  LMP 09/24/2011   Height: 5' 0.5" (153.7 cm)  Ht Readings from Last 3 Encounters:  02/19/15 5' 0.5" (1.537 m)  02/03/14 5' 0.75" (1.543 m)  06/17/13 5' (1.524 m)    General appearance: alert, cooperative and appears stated age Head: Normocephalic, without obvious abnormality, atraumatic Neck: no adenopathy, supple, symmetrical, trachea midline and thyroid normal to inspection and palpation Lungs: clear to auscultation bilaterally Breasts: normal appearance, no masses or tenderness Heart: regular rate and rhythm Abdomen: soft, non-tender; bowel sounds normal; no masses,  no organomegaly Extremities: extremities normal, atraumatic, no cyanosis or edema Skin: Skin color, texture, turgor normal. No rashes or  lesions Lymph nodes: Cervical, supraclavicular, and axillary nodes normal. No abnormal inguinal nodes palpated Neurologic: Grossly normal   Pelvic: External genitalia:  no lesions              Urethra:  normal appearing urethra with no masses, tenderness or lesions              Bartholins and Skenes: normal                 Vagina: normal appearing vagina with normal color and discharge, no lesions              Cervix: absent              Pap taken: No. Bimanual Exam:  Uterus:  uterus absent              Adnexa: no mass, fullness, tenderness               Rectovaginal: Confirms               Anus:  normal sphincter tone, no lesions  Chaperone was present for exam.  A:  Well Woman with normal exam  S/p TLH/BSO, on HRT  H/O depression  H/O decreased libido, using topical testosterone   P: Mammogram yearly.  pap smear not indicated due to h/o TLH, negative cervical pathology  Rx for minivelle 0.1mg  patches twice weekly to skin #24/4 RF.  Topical testosterone rx 1% cream, 1/2 ml daily. #60gm.  No rx needed at this time.  Pharmacy will fax. Return annually or prn

## 2015-02-26 ENCOUNTER — Telehealth: Payer: Self-pay

## 2015-02-26 NOTE — Telephone Encounter (Signed)
Lmtcb//kn 

## 2015-03-08 ENCOUNTER — Other Ambulatory Visit: Payer: Self-pay

## 2015-03-08 MED ORDER — NONFORMULARY OR COMPOUNDED ITEM: Status: DC

## 2015-03-08 NOTE — Telephone Encounter (Signed)
Patients Testosterone HRT medication was faxed to [facility].

## 2015-03-08 NOTE — Telephone Encounter (Signed)
Medication refill request: Testosterone HRT Last AEX:  02/19/15 SM Next AEX: 05/23/16 SM Last MMG (if hormonal medication request): 02/05/15 3D dense Catergory C, Bi-rads C 2 Benign Refill authorized: please advise.

## 2015-05-10 ENCOUNTER — Encounter: Payer: Self-pay | Admitting: Internal Medicine

## 2015-05-13 ENCOUNTER — Encounter: Payer: Self-pay | Admitting: Internal Medicine

## 2015-05-13 ENCOUNTER — Ambulatory Visit (INDEPENDENT_AMBULATORY_CARE_PROVIDER_SITE_OTHER): Payer: 59 | Admitting: Internal Medicine

## 2015-05-13 VITALS — BP 104/72 | HR 88 | Temp 98.6°F | Ht 60.5 in | Wt 172.8 lb

## 2015-05-13 DIAGNOSIS — Z0000 Encounter for general adult medical examination without abnormal findings: Secondary | ICD-10-CM

## 2015-05-13 DIAGNOSIS — R739 Hyperglycemia, unspecified: Secondary | ICD-10-CM

## 2015-05-13 DIAGNOSIS — Z114 Encounter for screening for human immunodeficiency virus [HIV]: Secondary | ICD-10-CM

## 2015-05-13 DIAGNOSIS — Z1159 Encounter for screening for other viral diseases: Secondary | ICD-10-CM

## 2015-05-13 NOTE — Patient Instructions (Addendum)
It was good to see you today.  We have reviewed your prior records including labs and tests today  Health Maintenance reviewed - all recommended immunizations and age-appropriate screenings are up-to-date.  Test(s) ordered today. Your results will be released to [facility] (or called to you) after review, usually within 72hours after test completion. If any changes need to be made, you will be notified at that same time.  Medications reviewed and updated, no changes recommended at this time.  Please schedule followup in 12 months for annual exam and labs, call sooner if problems.  Health Maintenance, Female Adopting a healthy lifestyle and getting preventive care can go a long way to promote health and wellness. Talk with your health care provider about what schedule of regular examinations is right for you. This is a good chance for you to check in with your provider about disease prevention and staying healthy. In between checkups, there are plenty of things you can do on your own. Experts have done a lot of research about which lifestyle changes and preventive measures are most likely to keep you healthy. Ask your health care provider for more information. WEIGHT AND DIET  Eat a healthy diet  Be sure to include plenty of vegetables, fruits, low-fat dairy products, and lean protein.  Do not eat a lot of foods high in solid fats, added sugars, or salt.  Get regular exercise. This is one of the most important things you can do for your health.  Most adults should exercise for at least 150 minutes each week. The exercise should increase your heart rate and make you sweat (moderate-intensity exercise).  Most adults should also do strengthening exercises at least twice a week. This is in addition to the moderate-intensity exercise.  Maintain a healthy weight  Body mass index (BMI) is a measurement that can be used to identify possible weight problems. It estimates body fat based on height and  weight. Your health care provider can help determine your BMI and help you achieve or maintain a healthy weight.  For females 79 years of age and older:   A BMI below 18.5 is considered underweight.  A BMI of 18.5 to 24.9 is normal.  A BMI of 25 to 29.9 is considered overweight.  A BMI of 30 and above is considered obese.  Watch levels of cholesterol and blood lipids  You should start having your blood tested for lipids and cholesterol at 60 years of age, then have this test every 5 years.  You may need to have your cholesterol levels checked more often if:  Your lipid or cholesterol levels are high.  You are older than 60 years of age.  You are at high risk for heart disease.  CANCER SCREENING   Lung Cancer  Lung cancer screening is recommended for adults 67-47 years old who are at high risk for lung cancer because of a history of smoking.  A yearly low-dose CT scan of the lungs is recommended for people who:  Currently smoke.  Have quit within the past 15 years.  Have at least a 30-pack-year history of smoking. A pack year is smoking an average of one pack of cigarettes a day for 1 year.  Yearly screening should continue until it has been 15 years since you quit.  Yearly screening should stop if you develop a health problem that would prevent you from having lung cancer treatment.  Breast Cancer  Practice breast self-awareness. This means understanding how your breasts normally appear  and feel.  It also means doing regular breast self-exams. Let your health care provider know about any changes, no matter how small.  If you are in your 20s or 30s, you should have a clinical breast exam (CBE) by a health care provider every 1-3 years as part of a regular health exam.  If you are 72 or older, have a CBE every year. Also consider having a breast X-ray (mammogram) every year.  If you have a family history of breast cancer, talk to your health care provider about  genetic screening.  If you are at high risk for breast cancer, talk to your health care provider about having an MRI and a mammogram every year.  Breast cancer gene (BRCA) assessment is recommended for women who have family members with BRCA-related cancers. BRCA-related cancers include:  Breast.  Ovarian.  Tubal.  Peritoneal cancers.  Results of the assessment will determine the need for genetic counseling and BRCA1 and BRCA2 testing. Cervical Cancer Your health care provider may recommend that you be screened regularly for cancer of the pelvic organs (ovaries, uterus, and vagina). This screening involves a pelvic examination, including checking for microscopic changes to the surface of your cervix (Pap test). You may be encouraged to have this screening done every 3 years, beginning at age 68.  For women ages 23-65, health care providers may recommend pelvic exams and Pap testing every 3 years, or they may recommend the Pap and pelvic exam, combined with testing for human papilloma virus (HPV), every 5 years. Some types of HPV increase your risk of cervical cancer. Testing for HPV may also be done on women of any age with unclear Pap test results.  Other health care providers may not recommend any screening for nonpregnant women who are considered low risk for pelvic cancer and who do not have symptoms. Ask your health care provider if a screening pelvic exam is right for you.  If you have had past treatment for cervical cancer or a condition that could lead to cancer, you need Pap tests and screening for cancer for at least 20 years after your treatment. If Pap tests have been discontinued, your risk factors (such as having a new sexual partner) need to be reassessed to determine if screening should resume. Some women have medical problems that increase the chance of getting cervical cancer. In these cases, your health care provider may recommend more frequent screening and Pap  tests. Colorectal Cancer  This type of cancer can be detected and often prevented.  Routine colorectal cancer screening usually begins at 60 years of age and continues through 60 years of age.  Your health care provider may recommend screening at an earlier age if you have risk factors for colon cancer.  Your health care provider may also recommend using home test kits to check for hidden blood in the stool.  A small camera at the end of a tube can be used to examine your colon directly (sigmoidoscopy or colonoscopy). This is done to check for the earliest forms of colorectal cancer.  Routine screening usually begins at age 52.  Direct examination of the colon should be repeated every 5-10 years through 60 years of age. However, you may need to be screened more often if early forms of precancerous polyps or small growths are found. Skin Cancer  Check your skin from head to toe regularly.  Tell your health care provider about any new moles or changes in moles, especially if there is a  change in a mole's shape or color.  Also tell your health care provider if you have a mole that is larger than the size of a pencil eraser.  Always use sunscreen. Apply sunscreen liberally and repeatedly throughout the day.  Protect yourself by wearing long sleeves, pants, a wide-brimmed hat, and sunglasses whenever you are outside. HEART DISEASE, DIABETES, AND HIGH BLOOD PRESSURE   High blood pressure causes heart disease and increases the risk of stroke. High blood pressure is more likely to develop in:  People who have blood pressure in the high end of the normal range (130-139/85-89 mm Hg).  People who are overweight or obese.  People who are African American.  If you are 60-60 years of age, have your blood pressure checked every 3-5 years. If you are 66 years of age or older, have your blood pressure checked every year. You should have your blood pressure measured twice--once when you are at a  hospital or clinic, and once when you are not at a hospital or clinic. Record the average of the two measurements. To check your blood pressure when you are not at a hospital or clinic, you can use:  An automated blood pressure machine at a pharmacy.  A home blood pressure monitor.  If you are between 38 years and 36 years old, ask your health care provider if you should take aspirin to prevent strokes.  Have regular diabetes screenings. This involves taking a blood sample to check your fasting blood sugar level.  If you are at a normal weight and have a low risk for diabetes, have this test once every three years after 60 years of age.  If you are overweight and have a high risk for diabetes, consider being tested at a younger age or more often. PREVENTING INFECTION  Hepatitis B  If you have a higher risk for hepatitis B, you should be screened for this virus. You are considered at high risk for hepatitis B if:  You were born in a country where hepatitis B is common. Ask your health care provider which countries are considered high risk.  Your parents were born in a high-risk country, and you have not been immunized against hepatitis B (hepatitis B vaccine).  You have HIV or AIDS.  You use needles to inject street drugs.  You live with someone who has hepatitis B.  You have had sex with someone who has hepatitis B.  You get hemodialysis treatment.  You take certain medicines for conditions, including cancer, organ transplantation, and autoimmune conditions. Hepatitis C  Blood testing is recommended for:  Everyone born from [birth year] through 1966.  Anyone with known risk factors for hepatitis C. Sexually transmitted infections (STIs)  You should be screened for sexually transmitted infections (STIs) including gonorrhea and chlamydia if:  You are sexually active and are younger than 60 years of age.  You are older than 60 years of age and your health care provider tells you  that you are at risk for this type of infection.  Your sexual activity has changed since you were last screened and you are at an increased risk for chlamydia or gonorrhea. Ask your health care provider if you are at risk.  If you do not have HIV, but are at risk, it may be recommended that you take a prescription medicine daily to prevent HIV infection. This is called pre-exposure prophylaxis (PrEP). You are considered at risk if:  You are sexually active and do not regularly use  condoms or know the HIV status of your partner(s).  You take drugs by injection.  You are sexually active with a partner who has HIV. Talk with your health care provider about whether you are at high risk of being infected with HIV. If you choose to begin PrEP, you should first be tested for HIV. You should then be tested every 3 months for as long as you are taking PrEP.  PREGNANCY   If you are premenopausal and you may become pregnant, ask your health care provider about preconception counseling.  If you may become pregnant, take 400 to 800 micrograms (mcg) of folic acid every day.  If you want to prevent pregnancy, talk to your health care provider about birth control (contraception). OSTEOPOROSIS AND MENOPAUSE   Osteoporosis is a disease in which the bones lose minerals and strength with aging. This can result in serious bone fractures. Your risk for osteoporosis can be identified using a bone density scan.  If you are 38 years of age or older, or if you are at risk for osteoporosis and fractures, ask your health care provider if you should be screened.  Ask your health care provider whether you should take a calcium or vitamin D supplement to lower your risk for osteoporosis.  Menopause may have certain physical symptoms and risks.  Hormone replacement therapy may reduce some of these symptoms and risks. Talk to your health care provider about whether hormone replacement therapy is right for you.  HOME  CARE INSTRUCTIONS   Schedule regular health, dental, and eye exams.  Stay current with your immunizations.   Do not use any tobacco products including cigarettes, chewing tobacco, or electronic cigarettes.  If you are pregnant, do not drink alcohol.  If you are breastfeeding, limit how much and how often you drink alcohol.  Limit alcohol intake to no more than 1 drink per day for nonpregnant women. One drink equals 12 ounces of beer, 5 ounces of wine, or 1 ounces of hard liquor.  Do not use street drugs.  Do not share needles.  Ask your health care provider for help if you need support or information about quitting drugs.  Tell your health care provider if you often feel depressed.  Tell your health care provider if you have ever been abused or do not feel safe at home.   This information is not intended to replace advice given to you by your health care provider. Make sure you discuss any questions you have with your health care provider.   Document Released: 01/23/2011 Document Revised: 07/31/2014 Document Reviewed: 06/11/2013 Elsevier Interactive Patient Education [employer].

## 2015-05-13 NOTE — Progress Notes (Signed)
Subjective:    Patient ID: Raiford Noble, female    DOB: 11-Oct-1955, 59 y.o.   MRN: [id number]  HPI  patient is here today for annual physical. Patient feels well overall Also reviewed chronic medical conditions, interval events and current concerns   Past Medical History  Diagnosis Date  . Depression   . Diverticulitis   . Chronic headaches   . GERD (gastroesophageal reflux disease)   . Migraines   . Allergic rhinitis, cause unspecified   . Blood transfusion     iron transfusion- [place]-2009  . History of sinus tachycardia     copy of holter monitor results on chart  . Heart murmur     diag with echo-physiologic murmur   Family History  Problem Relation Age of Onset  . Arthritis Mother   . Hyperlipidemia Mother   . Heart disease Mother   . Arthritis Father   . Lung cancer Father     smoker  . Heart disease Father   . Hypertension Father   . Diabetes Father   . Colon cancer Paternal Aunt   . CAD Paternal Grandmother   . Kidney disease Maternal Grandfather     ESRD on HD  . Kidney disease Maternal Grandmother     ESRD on HD   Social History  Substance Use Topics  . Smoking status: Former Smoker    Types: Cigarettes    Quit date: 10/09/2001  . Smokeless tobacco: Never Used  . Alcohol Use: No    Review of Systems  Constitutional: Negative for fatigue and unexpected weight change.  Respiratory: Negative for cough, shortness of breath and wheezing.   Cardiovascular: Negative for chest pain, palpitations and leg swelling.  Gastrointestinal: Negative for nausea, abdominal pain and diarrhea.  Neurological: Positive for headaches (occ, stress related). Negative for dizziness, weakness, light-headedness and numbness.  Psychiatric/Behavioral: Negative for dysphoric mood. The patient is not nervous/anxious.   All other systems reviewed and are negative.      Objective:    Physical Exam  Constitutional: She is oriented to person, place, and time. She appears  well-developed and well-nourished. No distress.  obese  HENT:  Head: Normocephalic and atraumatic.  Right Ear: External ear normal.  Left Ear: External ear normal.  Nose: Nose normal.  Mouth/Throat: Oropharynx is clear and moist. No oropharyngeal exudate.  Eyes: EOM are normal. Pupils are equal, round, and reactive to light. Right eye exhibits no discharge. Left eye exhibits no discharge. No scleral icterus.  Neck: Normal range of motion. Neck supple. No JVD present. No tracheal deviation present. No thyromegaly present.  Cardiovascular: Normal rate, regular rhythm, normal heart sounds and intact distal pulses.  Exam reveals no friction rub.   No murmur heard. Pulmonary/Chest: Effort normal and breath sounds normal. No respiratory distress. She has no wheezes. She has no rales. She exhibits no tenderness.  Abdominal: Soft. Bowel sounds are normal. She exhibits no distension and no mass. There is no tenderness. There is no rebound and no guarding.  Musculoskeletal: Normal range of motion.  Lymphadenopathy:    She has no cervical adenopathy.  Neurological: She is alert and oriented to person, place, and time. She has normal reflexes. No cranial nerve deficit.  Skin: Skin is warm and dry. No rash noted. She is not diaphoretic. No erythema.  Psychiatric: She has a normal mood and affect. Her behavior is normal. Judgment and thought content normal.  Nursing note and vitals reviewed.   BP 104/72 mmHg  Pulse  88  Temp(Src) 98.6 F (37 C) (Oral)  Ht 5' 0.5" (1.537 m)  Wt 172 lb 12 oz (78.359 kg)  BMI 33.17 kg/m2  SpO2 96%  LMP 09/24/2011 Wt Readings from Last 3 Encounters:  05/13/15 172 lb 12 oz (78.359 kg)  02/19/15 171 lb (77.565 kg)  02/03/14 169 lb (76.658 kg)    Lab Results  Component Value Date   WBC 8.4 07/21/2013   HGB 14.8 07/21/2013   HCT 45.4 07/21/2013   PLT 345.0 07/21/2013   GLUCOSE 107* 07/21/2013   CHOL 187 07/21/2013   TRIG 102.0 07/21/2013   HDL 46.90  07/21/2013   LDLDIRECT 149.3 05/22/2012   LDLCALC 120* 07/21/2013   ALT 55* 07/21/2013   AST 40* 07/21/2013   NA 143 07/21/2013   K 4.5 07/21/2013   CL 107 07/21/2013   CREATININE 0.9 07/21/2013   BUN 21 07/21/2013   CO2 25 07/21/2013   TSH 1.36 07/21/2013    Dg Chest 1 View  08/02/2012  *RADIOLOGY REPORT* Clinical Data: Positive PPD CHEST - 1 VIEW Comparison: None. Findings: The lungs are clear.  Mediastinal contours appear normal. The heart is within normal limits in size.  No bony abnormality is seen. IMPRESSION: No active lung disease. Original Report Authenticated By: Ivar Drape, M.D.       Assessment & Plan:   CPX/z00.00 - Patient has been counseled on age-appropriate routine health concerns for screening and prevention. These are reviewed and up-to-date. Immunizations are up-to-date or declined. Labs and ECG reviewed.  Mild fasting hyperglycemia. Family history of diabetes reviewed. Weight gain reviewed. Check A1c. Advised aerobic exercise and attention to weight reduction through healthy food habits  Problem List Items Addressed This Visit    None    Visit Diagnoses    Routine general medical examination at a health care facility    -  Primary    Relevant Orders    Basic metabolic panel    CBC with Differential/Platelet    Hepatic function panel    Lipid panel    TSH    Urinalysis, Routine w reflex microscopic (not at [facility])    Screening for HIV without presence of risk factors        Relevant Orders    HIV antibody    Need for hepatitis C screening test        Relevant Orders    Hepatitis C antibody    Hyperglycemia        Relevant Orders    Hemoglobin A1c        Gwendolyn Grant, MD

## 2015-05-13 NOTE — Progress Notes (Signed)
Pre visit review using our clinic review tool, if applicable. No additional management support is needed unless otherwise documented below in the visit note. 

## 2015-05-28 ENCOUNTER — Other Ambulatory Visit (INDEPENDENT_AMBULATORY_CARE_PROVIDER_SITE_OTHER): Payer: 59

## 2015-05-28 DIAGNOSIS — R739 Hyperglycemia, unspecified: Secondary | ICD-10-CM | POA: Diagnosis not present

## 2015-05-28 DIAGNOSIS — Z0000 Encounter for general adult medical examination without abnormal findings: Secondary | ICD-10-CM

## 2015-05-28 DIAGNOSIS — Z114 Encounter for screening for human immunodeficiency virus [HIV]: Secondary | ICD-10-CM

## 2015-05-28 DIAGNOSIS — Z1159 Encounter for screening for other viral diseases: Secondary | ICD-10-CM

## 2015-05-28 LAB — CBC WITH DIFFERENTIAL/PLATELET
BASOS PCT: 0.6 % (ref 0.0–3.0)
Basophils Absolute: 0 10*3/uL (ref 0.0–0.1)
EOS ABS: 0.2 10*3/uL (ref 0.0–0.7)
Eosinophils Relative: 2.7 % (ref 0.0–5.0)
HEMATOCRIT: 40.3 % (ref 36.0–46.0)
Hemoglobin: 13.5 g/dL (ref 12.0–15.0)
LYMPHS PCT: 31.5 % (ref 12.0–46.0)
Lymphs Abs: 2.5 10*3/uL (ref 0.7–4.0)
MCHC: 33.5 g/dL (ref 30.0–36.0)
MCV: 89.2 fl (ref 78.0–100.0)
Monocytes Absolute: 0.4 10*3/uL (ref 0.1–1.0)
Monocytes Relative: 4.8 % (ref 3.0–12.0)
NEUTROS ABS: 4.8 10*3/uL (ref 1.4–7.7)
Neutrophils Relative %: 60.4 % (ref 43.0–77.0)
PLATELETS: 306 10*3/uL (ref 150.0–400.0)
RBC: 4.52 Mil/uL (ref 3.87–5.11)
RDW: 13.5 % (ref 11.5–15.5)
WBC: 7.9 10*3/uL (ref 4.0–10.5)

## 2015-05-28 LAB — BASIC METABOLIC PANEL
BUN: 24 mg/dL — AB (ref 6–23)
CHLORIDE: 104 meq/L (ref 96–112)
CO2: 29 meq/L (ref 19–32)
CREATININE: 0.74 mg/dL (ref 0.40–1.20)
Calcium: 9.3 mg/dL (ref 8.4–10.5)
GFR: 85.11 mL/min (ref >60.00)
Glucose, Bld: 105 mg/dL — ABNORMAL HIGH (ref 70–99)
Potassium: 4.1 mEq/L (ref 3.5–5.1)
Sodium: 139 mEq/L (ref 135–145)

## 2015-05-28 LAB — URINALYSIS, ROUTINE W REFLEX MICROSCOPIC
BILIRUBIN URINE: NEGATIVE
HGB URINE DIPSTICK: NEGATIVE
KETONES UR: NEGATIVE
LEUKOCYTES UA: NEGATIVE
NITRITE: NEGATIVE
Specific Gravity, Urine: 1.02 (ref 1.000–1.030)
Total Protein, Urine: NEGATIVE
UROBILINOGEN UA: 0.2 (ref 0.0–1.0)
Urine Glucose: NEGATIVE
pH: 6.5 (ref 5.0–8.0)

## 2015-05-28 LAB — LIPID PANEL
CHOL/HDL RATIO: 3
CHOLESTEROL: 153 mg/dL (ref 0–200)
HDL: 49.2 mg/dL (ref >39.00)
LDL Cholesterol: 85 mg/dL (ref 0–99)
NonHDL: 103.49
TRIGLYCERIDES: 91 mg/dL (ref 0.0–149.0)
VLDL: 18.2 mg/dL (ref 0.0–40.0)

## 2015-05-28 LAB — HEPATIC FUNCTION PANEL
ALBUMIN: 4.1 g/dL (ref 3.5–5.2)
ALT: 27 U/L (ref 0–35)
AST: 16 U/L (ref 0–37)
Alkaline Phosphatase: 77 U/L (ref 39–117)
BILIRUBIN DIRECT: 0.1 mg/dL (ref 0.0–0.3)
TOTAL PROTEIN: 6.7 g/dL (ref 6.0–8.3)
Total Bilirubin: 0.4 mg/dL (ref 0.2–1.2)

## 2015-05-28 LAB — HEMOGLOBIN A1C: Hgb A1c MFr Bld: 5.9 % (ref 4.6–6.5)

## 2015-05-28 LAB — TSH: TSH: 1.41 u[IU]/mL (ref 0.35–4.50)

## 2015-05-29 LAB — HEPATITIS C ANTIBODY: HCV AB: NEGATIVE

## 2015-05-29 LAB — HIV ANTIBODY (ROUTINE TESTING W REFLEX): HIV: NONREACTIVE

## 2015-06-03 NOTE — Telephone Encounter (Signed)
Patient was notified of BMD results at her AEX//kn

## 2015-08-05 DIAGNOSIS — M5136 Other intervertebral disc degeneration, lumbar region: Secondary | ICD-10-CM | POA: Diagnosis not present

## 2015-08-05 DIAGNOSIS — M5032 Other cervical disc degeneration, mid-cervical region, unspecified level: Secondary | ICD-10-CM | POA: Diagnosis not present

## 2015-08-12 MED FILL — MINIVELLE 0.1 MG PATCH: 0.1 | 28 days supply | Qty: 8 | Fill #4

## 2015-08-16 DIAGNOSIS — M4003 Postural kyphosis, cervicothoracic region: Secondary | ICD-10-CM | POA: Diagnosis not present

## 2015-08-16 DIAGNOSIS — M9903 Segmental and somatic dysfunction of lumbar region: Secondary | ICD-10-CM | POA: Diagnosis not present

## 2015-08-16 DIAGNOSIS — M50321 Other cervical disc degeneration at C4-C5 level: Secondary | ICD-10-CM | POA: Diagnosis not present

## 2015-08-16 DIAGNOSIS — M9905 Segmental and somatic dysfunction of pelvic region: Secondary | ICD-10-CM | POA: Diagnosis not present

## 2015-08-16 DIAGNOSIS — M9902 Segmental and somatic dysfunction of thoracic region: Secondary | ICD-10-CM | POA: Diagnosis not present

## 2015-08-16 DIAGNOSIS — M9904 Segmental and somatic dysfunction of sacral region: Secondary | ICD-10-CM | POA: Diagnosis not present

## 2015-08-16 DIAGNOSIS — M5136 Other intervertebral disc degeneration, lumbar region: Secondary | ICD-10-CM | POA: Diagnosis not present

## 2015-08-16 DIAGNOSIS — Q72812 Congenital shortening of left lower limb: Secondary | ICD-10-CM | POA: Diagnosis not present

## 2015-08-16 DIAGNOSIS — M9901 Segmental and somatic dysfunction of cervical region: Secondary | ICD-10-CM | POA: Diagnosis not present

## 2015-08-17 DIAGNOSIS — M9904 Segmental and somatic dysfunction of sacral region: Secondary | ICD-10-CM | POA: Diagnosis not present

## 2015-08-17 DIAGNOSIS — M4003 Postural kyphosis, cervicothoracic region: Secondary | ICD-10-CM | POA: Diagnosis not present

## 2015-08-17 DIAGNOSIS — M9902 Segmental and somatic dysfunction of thoracic region: Secondary | ICD-10-CM | POA: Diagnosis not present

## 2015-08-17 DIAGNOSIS — Q72812 Congenital shortening of left lower limb: Secondary | ICD-10-CM | POA: Diagnosis not present

## 2015-08-17 DIAGNOSIS — M50321 Other cervical disc degeneration at C4-C5 level: Secondary | ICD-10-CM | POA: Diagnosis not present

## 2015-08-17 DIAGNOSIS — M9903 Segmental and somatic dysfunction of lumbar region: Secondary | ICD-10-CM | POA: Diagnosis not present

## 2015-08-17 DIAGNOSIS — M5136 Other intervertebral disc degeneration, lumbar region: Secondary | ICD-10-CM | POA: Diagnosis not present

## 2015-08-17 DIAGNOSIS — M9901 Segmental and somatic dysfunction of cervical region: Secondary | ICD-10-CM | POA: Diagnosis not present

## 2015-08-17 DIAGNOSIS — M9905 Segmental and somatic dysfunction of pelvic region: Secondary | ICD-10-CM | POA: Diagnosis not present

## 2015-09-03 ENCOUNTER — Other Ambulatory Visit: Payer: Self-pay | Admitting: Internal Medicine

## 2015-09-03 MED FILL — DULoxetine HCL 60 MG CPEP: 60 | 90 days supply | Qty: 90 | Fill #0

## 2015-09-03 MED FILL — PANTOPRAZOLE SOD DR 40 MG T: 40 | 90 days supply | Qty: 90 | Fill #0

## 2015-09-22 DIAGNOSIS — M9903 Segmental and somatic dysfunction of lumbar region: Secondary | ICD-10-CM | POA: Diagnosis not present

## 2015-09-22 DIAGNOSIS — Q72812 Congenital shortening of left lower limb: Secondary | ICD-10-CM | POA: Diagnosis not present

## 2015-09-22 DIAGNOSIS — M4003 Postural kyphosis, cervicothoracic region: Secondary | ICD-10-CM | POA: Diagnosis not present

## 2015-09-22 DIAGNOSIS — M50321 Other cervical disc degeneration at C4-C5 level: Secondary | ICD-10-CM | POA: Diagnosis not present

## 2015-09-22 DIAGNOSIS — M9905 Segmental and somatic dysfunction of pelvic region: Secondary | ICD-10-CM | POA: Diagnosis not present

## 2015-09-22 DIAGNOSIS — M5136 Other intervertebral disc degeneration, lumbar region: Secondary | ICD-10-CM | POA: Diagnosis not present

## 2015-09-22 DIAGNOSIS — M9901 Segmental and somatic dysfunction of cervical region: Secondary | ICD-10-CM | POA: Diagnosis not present

## 2015-09-22 DIAGNOSIS — M9904 Segmental and somatic dysfunction of sacral region: Secondary | ICD-10-CM | POA: Diagnosis not present

## 2015-09-22 DIAGNOSIS — M9902 Segmental and somatic dysfunction of thoracic region: Secondary | ICD-10-CM | POA: Diagnosis not present

## 2015-09-23 DIAGNOSIS — M9902 Segmental and somatic dysfunction of thoracic region: Secondary | ICD-10-CM | POA: Diagnosis not present

## 2015-09-23 DIAGNOSIS — M9905 Segmental and somatic dysfunction of pelvic region: Secondary | ICD-10-CM | POA: Diagnosis not present

## 2015-09-23 DIAGNOSIS — M50321 Other cervical disc degeneration at C4-C5 level: Secondary | ICD-10-CM | POA: Diagnosis not present

## 2015-09-23 DIAGNOSIS — M4003 Postural kyphosis, cervicothoracic region: Secondary | ICD-10-CM | POA: Diagnosis not present

## 2015-09-23 DIAGNOSIS — M9901 Segmental and somatic dysfunction of cervical region: Secondary | ICD-10-CM | POA: Diagnosis not present

## 2015-09-23 DIAGNOSIS — M5136 Other intervertebral disc degeneration, lumbar region: Secondary | ICD-10-CM | POA: Diagnosis not present

## 2015-09-23 DIAGNOSIS — M9903 Segmental and somatic dysfunction of lumbar region: Secondary | ICD-10-CM | POA: Diagnosis not present

## 2015-09-23 DIAGNOSIS — Q72812 Congenital shortening of left lower limb: Secondary | ICD-10-CM | POA: Diagnosis not present

## 2015-09-23 DIAGNOSIS — M9904 Segmental and somatic dysfunction of sacral region: Secondary | ICD-10-CM | POA: Diagnosis not present

## 2015-09-27 DIAGNOSIS — M9905 Segmental and somatic dysfunction of pelvic region: Secondary | ICD-10-CM | POA: Diagnosis not present

## 2015-09-27 DIAGNOSIS — M50321 Other cervical disc degeneration at C4-C5 level: Secondary | ICD-10-CM | POA: Diagnosis not present

## 2015-09-27 DIAGNOSIS — M9904 Segmental and somatic dysfunction of sacral region: Secondary | ICD-10-CM | POA: Diagnosis not present

## 2015-09-27 DIAGNOSIS — M9903 Segmental and somatic dysfunction of lumbar region: Secondary | ICD-10-CM | POA: Diagnosis not present

## 2015-09-27 DIAGNOSIS — M9901 Segmental and somatic dysfunction of cervical region: Secondary | ICD-10-CM | POA: Diagnosis not present

## 2015-09-27 DIAGNOSIS — M9902 Segmental and somatic dysfunction of thoracic region: Secondary | ICD-10-CM | POA: Diagnosis not present

## 2015-09-27 DIAGNOSIS — Q72812 Congenital shortening of left lower limb: Secondary | ICD-10-CM | POA: Diagnosis not present

## 2015-09-27 DIAGNOSIS — M4003 Postural kyphosis, cervicothoracic region: Secondary | ICD-10-CM | POA: Diagnosis not present

## 2015-09-27 DIAGNOSIS — M5136 Other intervertebral disc degeneration, lumbar region: Secondary | ICD-10-CM | POA: Diagnosis not present

## 2015-09-28 DIAGNOSIS — M5136 Other intervertebral disc degeneration, lumbar region: Secondary | ICD-10-CM | POA: Diagnosis not present

## 2015-09-28 DIAGNOSIS — M9905 Segmental and somatic dysfunction of pelvic region: Secondary | ICD-10-CM | POA: Diagnosis not present

## 2015-09-28 DIAGNOSIS — M9904 Segmental and somatic dysfunction of sacral region: Secondary | ICD-10-CM | POA: Diagnosis not present

## 2015-09-28 DIAGNOSIS — M9901 Segmental and somatic dysfunction of cervical region: Secondary | ICD-10-CM | POA: Diagnosis not present

## 2015-09-28 DIAGNOSIS — M9902 Segmental and somatic dysfunction of thoracic region: Secondary | ICD-10-CM | POA: Diagnosis not present

## 2015-09-28 DIAGNOSIS — M4003 Postural kyphosis, cervicothoracic region: Secondary | ICD-10-CM | POA: Diagnosis not present

## 2015-09-28 DIAGNOSIS — Q72812 Congenital shortening of left lower limb: Secondary | ICD-10-CM | POA: Diagnosis not present

## 2015-09-28 DIAGNOSIS — M50321 Other cervical disc degeneration at C4-C5 level: Secondary | ICD-10-CM | POA: Diagnosis not present

## 2015-09-28 DIAGNOSIS — M9903 Segmental and somatic dysfunction of lumbar region: Secondary | ICD-10-CM | POA: Diagnosis not present

## 2015-09-29 MED FILL — MINIVELLE 0.1 MG PATCH: 0.1 | 28 days supply | Qty: 8 | Fill #5

## 2015-10-04 DIAGNOSIS — M9901 Segmental and somatic dysfunction of cervical region: Secondary | ICD-10-CM | POA: Diagnosis not present

## 2015-10-04 DIAGNOSIS — M9904 Segmental and somatic dysfunction of sacral region: Secondary | ICD-10-CM | POA: Diagnosis not present

## 2015-10-04 DIAGNOSIS — Q72812 Congenital shortening of left lower limb: Secondary | ICD-10-CM | POA: Diagnosis not present

## 2015-10-04 DIAGNOSIS — M5136 Other intervertebral disc degeneration, lumbar region: Secondary | ICD-10-CM | POA: Diagnosis not present

## 2015-10-04 DIAGNOSIS — M9902 Segmental and somatic dysfunction of thoracic region: Secondary | ICD-10-CM | POA: Diagnosis not present

## 2015-10-04 DIAGNOSIS — M4003 Postural kyphosis, cervicothoracic region: Secondary | ICD-10-CM | POA: Diagnosis not present

## 2015-10-04 DIAGNOSIS — M9903 Segmental and somatic dysfunction of lumbar region: Secondary | ICD-10-CM | POA: Diagnosis not present

## 2015-10-04 DIAGNOSIS — M50321 Other cervical disc degeneration at C4-C5 level: Secondary | ICD-10-CM | POA: Diagnosis not present

## 2015-10-04 DIAGNOSIS — M9905 Segmental and somatic dysfunction of pelvic region: Secondary | ICD-10-CM | POA: Diagnosis not present

## 2015-10-11 DIAGNOSIS — M9905 Segmental and somatic dysfunction of pelvic region: Secondary | ICD-10-CM | POA: Diagnosis not present

## 2015-10-11 DIAGNOSIS — M9901 Segmental and somatic dysfunction of cervical region: Secondary | ICD-10-CM | POA: Diagnosis not present

## 2015-10-11 DIAGNOSIS — M50321 Other cervical disc degeneration at C4-C5 level: Secondary | ICD-10-CM | POA: Diagnosis not present

## 2015-10-11 DIAGNOSIS — M4003 Postural kyphosis, cervicothoracic region: Secondary | ICD-10-CM | POA: Diagnosis not present

## 2015-10-11 DIAGNOSIS — M9902 Segmental and somatic dysfunction of thoracic region: Secondary | ICD-10-CM | POA: Diagnosis not present

## 2015-10-11 DIAGNOSIS — M9903 Segmental and somatic dysfunction of lumbar region: Secondary | ICD-10-CM | POA: Diagnosis not present

## 2015-10-11 DIAGNOSIS — M5136 Other intervertebral disc degeneration, lumbar region: Secondary | ICD-10-CM | POA: Diagnosis not present

## 2015-10-11 DIAGNOSIS — M9904 Segmental and somatic dysfunction of sacral region: Secondary | ICD-10-CM | POA: Diagnosis not present

## 2015-10-11 DIAGNOSIS — Q72812 Congenital shortening of left lower limb: Secondary | ICD-10-CM | POA: Diagnosis not present

## 2015-10-12 DIAGNOSIS — M9905 Segmental and somatic dysfunction of pelvic region: Secondary | ICD-10-CM | POA: Diagnosis not present

## 2015-10-12 DIAGNOSIS — Q72812 Congenital shortening of left lower limb: Secondary | ICD-10-CM | POA: Diagnosis not present

## 2015-10-12 DIAGNOSIS — M5136 Other intervertebral disc degeneration, lumbar region: Secondary | ICD-10-CM | POA: Diagnosis not present

## 2015-10-12 DIAGNOSIS — M4003 Postural kyphosis, cervicothoracic region: Secondary | ICD-10-CM | POA: Diagnosis not present

## 2015-10-12 DIAGNOSIS — M9901 Segmental and somatic dysfunction of cervical region: Secondary | ICD-10-CM | POA: Diagnosis not present

## 2015-10-12 DIAGNOSIS — M50321 Other cervical disc degeneration at C4-C5 level: Secondary | ICD-10-CM | POA: Diagnosis not present

## 2015-10-12 DIAGNOSIS — M9904 Segmental and somatic dysfunction of sacral region: Secondary | ICD-10-CM | POA: Diagnosis not present

## 2015-10-12 DIAGNOSIS — M9903 Segmental and somatic dysfunction of lumbar region: Secondary | ICD-10-CM | POA: Diagnosis not present

## 2015-10-12 DIAGNOSIS — M9902 Segmental and somatic dysfunction of thoracic region: Secondary | ICD-10-CM | POA: Diagnosis not present

## 2015-10-19 DIAGNOSIS — Q72812 Congenital shortening of left lower limb: Secondary | ICD-10-CM | POA: Diagnosis not present

## 2015-10-19 DIAGNOSIS — M9901 Segmental and somatic dysfunction of cervical region: Secondary | ICD-10-CM | POA: Diagnosis not present

## 2015-10-19 DIAGNOSIS — M9904 Segmental and somatic dysfunction of sacral region: Secondary | ICD-10-CM | POA: Diagnosis not present

## 2015-10-19 DIAGNOSIS — M9905 Segmental and somatic dysfunction of pelvic region: Secondary | ICD-10-CM | POA: Diagnosis not present

## 2015-10-19 DIAGNOSIS — M5136 Other intervertebral disc degeneration, lumbar region: Secondary | ICD-10-CM | POA: Diagnosis not present

## 2015-10-19 DIAGNOSIS — M50321 Other cervical disc degeneration at C4-C5 level: Secondary | ICD-10-CM | POA: Diagnosis not present

## 2015-10-19 DIAGNOSIS — M9902 Segmental and somatic dysfunction of thoracic region: Secondary | ICD-10-CM | POA: Diagnosis not present

## 2015-10-19 DIAGNOSIS — M4003 Postural kyphosis, cervicothoracic region: Secondary | ICD-10-CM | POA: Diagnosis not present

## 2015-10-19 DIAGNOSIS — M9903 Segmental and somatic dysfunction of lumbar region: Secondary | ICD-10-CM | POA: Diagnosis not present

## 2015-10-25 DIAGNOSIS — Q72812 Congenital shortening of left lower limb: Secondary | ICD-10-CM | POA: Diagnosis not present

## 2015-10-25 DIAGNOSIS — M9901 Segmental and somatic dysfunction of cervical region: Secondary | ICD-10-CM | POA: Diagnosis not present

## 2015-10-25 DIAGNOSIS — M9903 Segmental and somatic dysfunction of lumbar region: Secondary | ICD-10-CM | POA: Diagnosis not present

## 2015-10-25 DIAGNOSIS — M4003 Postural kyphosis, cervicothoracic region: Secondary | ICD-10-CM | POA: Diagnosis not present

## 2015-10-25 DIAGNOSIS — M50321 Other cervical disc degeneration at C4-C5 level: Secondary | ICD-10-CM | POA: Diagnosis not present

## 2015-10-25 DIAGNOSIS — M9902 Segmental and somatic dysfunction of thoracic region: Secondary | ICD-10-CM | POA: Diagnosis not present

## 2015-10-25 DIAGNOSIS — M5136 Other intervertebral disc degeneration, lumbar region: Secondary | ICD-10-CM | POA: Diagnosis not present

## 2015-10-25 DIAGNOSIS — M9905 Segmental and somatic dysfunction of pelvic region: Secondary | ICD-10-CM | POA: Diagnosis not present

## 2015-10-25 DIAGNOSIS — M9904 Segmental and somatic dysfunction of sacral region: Secondary | ICD-10-CM | POA: Diagnosis not present

## 2015-10-26 DIAGNOSIS — R030 Elevated blood-pressure reading, without diagnosis of hypertension: Secondary | ICD-10-CM | POA: Diagnosis not present

## 2015-10-26 DIAGNOSIS — Z6833 Body mass index (BMI) 33.0-33.9, adult: Secondary | ICD-10-CM | POA: Diagnosis not present

## 2015-10-26 DIAGNOSIS — M542 Cervicalgia: Secondary | ICD-10-CM | POA: Diagnosis not present

## 2015-10-26 DIAGNOSIS — M4316 Spondylolisthesis, lumbar region: Secondary | ICD-10-CM | POA: Diagnosis not present

## 2015-10-26 DIAGNOSIS — M5137 Other intervertebral disc degeneration, lumbosacral region: Secondary | ICD-10-CM | POA: Diagnosis not present

## 2015-10-26 DIAGNOSIS — M5023 Other cervical disc displacement, cervicothoracic region: Secondary | ICD-10-CM | POA: Diagnosis not present

## 2015-10-26 MED FILL — MINIVELLE 0.1 MG PATCH: 0.1 | 28 days supply | Qty: 8 | Fill #6

## 2015-10-27 ENCOUNTER — Other Ambulatory Visit (HOSPITAL_COMMUNITY): Payer: Self-pay | Admitting: Neurosurgery

## 2015-10-27 DIAGNOSIS — M5137 Other intervertebral disc degeneration, lumbosacral region: Secondary | ICD-10-CM

## 2015-10-27 DIAGNOSIS — M5023 Other cervical disc displacement, cervicothoracic region: Secondary | ICD-10-CM

## 2015-11-04 ENCOUNTER — Ambulatory Visit (HOSPITAL_COMMUNITY): Payer: 59

## 2015-11-10 ENCOUNTER — Ambulatory Visit (HOSPITAL_COMMUNITY)
Admission: RE | Admit: 2015-11-10 | Discharge: 2015-11-10 | Disposition: A | Discharge status: Home / Self Care | Payer: 59 | Source: Ambulatory Visit | Attending: Neurosurgery | Admitting: Neurosurgery

## 2015-11-10 DIAGNOSIS — M50322 Other cervical disc degeneration at C5-C6 level: Secondary | ICD-10-CM | POA: Diagnosis not present

## 2015-11-10 DIAGNOSIS — M5136 Other intervertebral disc degeneration, lumbar region: Secondary | ICD-10-CM | POA: Diagnosis not present

## 2015-11-10 DIAGNOSIS — M4806 Spinal stenosis, lumbar region: Secondary | ICD-10-CM | POA: Insufficient documentation

## 2015-11-10 DIAGNOSIS — M5137 Other intervertebral disc degeneration, lumbosacral region: Secondary | ICD-10-CM | POA: Insufficient documentation

## 2015-11-10 DIAGNOSIS — M4802 Spinal stenosis, cervical region: Secondary | ICD-10-CM | POA: Insufficient documentation

## 2015-11-10 DIAGNOSIS — M50321 Other cervical disc degeneration at C4-C5 level: Secondary | ICD-10-CM | POA: Diagnosis not present

## 2015-11-10 DIAGNOSIS — M5023 Other cervical disc displacement, cervicothoracic region: Secondary | ICD-10-CM | POA: Diagnosis not present

## 2015-11-11 DIAGNOSIS — Z6832 Body mass index (BMI) 32.0-32.9, adult: Secondary | ICD-10-CM | POA: Diagnosis not present

## 2015-11-11 DIAGNOSIS — M5137 Other intervertebral disc degeneration, lumbosacral region: Secondary | ICD-10-CM | POA: Diagnosis not present

## 2015-11-11 DIAGNOSIS — M542 Cervicalgia: Secondary | ICD-10-CM | POA: Diagnosis not present

## 2015-11-11 DIAGNOSIS — M5023 Other cervical disc displacement, cervicothoracic region: Secondary | ICD-10-CM | POA: Diagnosis not present

## 2015-11-15 DIAGNOSIS — M542 Cervicalgia: Secondary | ICD-10-CM | POA: Diagnosis not present

## 2015-11-15 DIAGNOSIS — M545 Low back pain: Secondary | ICD-10-CM | POA: Diagnosis not present

## 2015-11-16 DIAGNOSIS — M545 Low back pain: Secondary | ICD-10-CM | POA: Diagnosis not present

## 2015-11-16 DIAGNOSIS — M542 Cervicalgia: Secondary | ICD-10-CM | POA: Diagnosis not present

## 2015-11-23 DIAGNOSIS — M545 Low back pain: Secondary | ICD-10-CM | POA: Diagnosis not present

## 2015-11-23 DIAGNOSIS — M542 Cervicalgia: Secondary | ICD-10-CM | POA: Diagnosis not present

## 2015-11-26 DIAGNOSIS — M542 Cervicalgia: Secondary | ICD-10-CM | POA: Diagnosis not present

## 2015-11-26 DIAGNOSIS — M545 Low back pain: Secondary | ICD-10-CM | POA: Diagnosis not present

## 2015-12-07 DIAGNOSIS — M542 Cervicalgia: Secondary | ICD-10-CM | POA: Diagnosis not present

## 2015-12-07 DIAGNOSIS — M545 Low back pain: Secondary | ICD-10-CM | POA: Diagnosis not present

## 2015-12-07 MED FILL — DULoxetine HCL 60 MG CPEP: 60 | 90 days supply | Qty: 90 | Fill #1

## 2015-12-07 MED FILL — MINIVELLE 0.1 MG PATCH: 0.1 | 28 days supply | Qty: 8 | Fill #7

## 2015-12-17 DIAGNOSIS — M545 Low back pain: Secondary | ICD-10-CM | POA: Diagnosis not present

## 2015-12-17 DIAGNOSIS — M542 Cervicalgia: Secondary | ICD-10-CM | POA: Diagnosis not present

## 2015-12-23 DIAGNOSIS — Z6832 Body mass index (BMI) 32.0-32.9, adult: Secondary | ICD-10-CM | POA: Diagnosis not present

## 2015-12-23 DIAGNOSIS — G959 Disease of spinal cord, unspecified: Secondary | ICD-10-CM | POA: Diagnosis not present

## 2015-12-27 DIAGNOSIS — D485 Neoplasm of uncertain behavior of skin: Secondary | ICD-10-CM | POA: Diagnosis not present

## 2015-12-27 DIAGNOSIS — L309 Dermatitis, unspecified: Secondary | ICD-10-CM | POA: Diagnosis not present

## 2015-12-30 MED FILL — PANTOPRAZOLE SOD DR 40 MG T: 40 | 90 days supply | Qty: 90 | Fill #1

## 2015-12-31 MED FILL — FLUOCINONIDE 0.05% CREAM: 0.05 | 15 days supply | Qty: 60 | Fill #0

## 2016-01-21 MED FILL — MINIVELLE 0.1 MG PATCH: 0.1 | 28 days supply | Qty: 8 | Fill #8

## 2016-02-09 ENCOUNTER — Encounter: Payer: Self-pay | Admitting: Internal Medicine

## 2016-02-10 NOTE — Telephone Encounter (Signed)
She needs an appt.

## 2016-02-17 MED FILL — MINIVELLE 0.1 MG PATCH: 0.1 | 28 days supply | Qty: 8 | Fill #9

## 2016-02-18 DIAGNOSIS — L2084 Intrinsic (allergic) eczema: Secondary | ICD-10-CM | POA: Diagnosis not present

## 2016-02-22 ENCOUNTER — Encounter: Payer: Self-pay | Admitting: Internal Medicine

## 2016-02-22 ENCOUNTER — Ambulatory Visit (INDEPENDENT_AMBULATORY_CARE_PROVIDER_SITE_OTHER): Payer: 59 | Admitting: Internal Medicine

## 2016-02-22 VITALS — BP 112/76 | HR 93 | Temp 98.1°F | Resp 16 | Wt 169.0 lb

## 2016-02-22 DIAGNOSIS — R1110 Vomiting, unspecified: Secondary | ICD-10-CM | POA: Insufficient documentation

## 2016-02-22 DIAGNOSIS — K219 Gastro-esophageal reflux disease without esophagitis: Secondary | ICD-10-CM

## 2016-02-22 DIAGNOSIS — R1011 Right upper quadrant pain: Secondary | ICD-10-CM | POA: Diagnosis not present

## 2016-02-22 DIAGNOSIS — R112 Nausea with vomiting, unspecified: Secondary | ICD-10-CM

## 2016-02-22 NOTE — Progress Notes (Signed)
Subjective:    Patient ID: Sarah Mullen, female    DOB: 1955/12/24, 60 y.o.   MRN: [id number]  HPI She is here for an acute visit.   In March she started to experience intermittent nausea and vomiting.  She has had this every week to every other week since then.  She is trying to stay away from fatty foods more recently because she was concerned it may have been her gallbladder.  She has not had any symptoms in the past three weeks.    She will feel nauseous and will vomit, and then does not feel well.  It does not seem to be related to when she eats.  She has had an episode of throwing up what she ate at breakfast and what she ate the night before.  She feels that her stomach does not digestive her food well.    One episode was associated with some RUQ tenderness.  She has normal BM.  Her appetite is normal.     Medications and allergies reviewed with patient and updated if appropriate.  Patient Active Problem List   Diagnosis Date Noted  . Positive TB test 05/28/2012  . Pelvic pressure in female 03/22/2011  . GERD (gastroesophageal reflux disease) 11/21/2010  . Depression 11/21/2010  . Menometrorrhagia 11/21/2010  . Dyshidrotic eczema 11/21/2010    Current Outpatient Prescriptions on File Prior to Visit  Medication Sig Dispense Refill  . aspirin EC 81 MG tablet Take 81 mg by mouth daily.    . cetirizine (ZYRTEC) 10 MG tablet Take 10 mg by mouth daily.      . DULoxetine (CYMBALTA) 60 MG capsule TAKE 1 CAPSULE (60 MG TOTAL) BY MOUTH DAILY. 90 capsule 2  . MINIVELLE 0.1 MG/24HR patch Place 1 patch (0.1 mg total) onto the skin 2 (two) times a week. 24 patch 4  . NONFORMULARY OR COMPOUNDED ITEM Testosterone HRT (3ML) 1% cream, Apply 1/2 ml topically every morning as directed.  #60 grams 1 each 5  . pantoprazole (PROTONIX) 40 MG tablet TAKE 1 TABLET (40 MG TOTAL) BY MOUTH DAILY. 90 tablet 2  . [DISCONTINUED] progesterone (PROMETRIUM) 200 MG capsule Take 200 mg by mouth daily.        No current facility-administered medications on file prior to visit.     Past Medical History:  Diagnosis Date  . Allergic rhinitis, cause unspecified   . Blood transfusion    iron transfusion- [place]-2009  . Chronic headaches   . Depression   . Diverticulitis   . GERD (gastroesophageal reflux disease)   . Heart murmur    diag with echo-physiologic murmur  . History of sinus tachycardia    copy of holter monitor results on chart  . Migraines     Past Surgical History:  Procedure Laterality Date  . ABDOMINAL HYSTERECTOMY  09/2011  . BREAST BIOPSY    . CERCLAGE REMOVAL    . CESAREAN SECTION    . CYSTOSCOPY  10/17/2011   Procedure: CYSTOSCOPY;  Surgeon: Megan Salon, MD;  Location: [facility];  Service: Gynecology;;  . DILATION AND CURETTAGE OF UTERUS    . TONSILLECTOMY      Social History   Social History  . Marital status: Single    Spouse name: N/A  . Number of children: N/A  . Years of education: N/A   Social History Main Topics  . Smoking status: Former Smoker    Types: Cigarettes    Quit date: 10/09/2001  .  Smokeless tobacco: Never Used  . Alcohol use No  . Drug use: No  . Sexual activity: Yes    Partners: Male    Birth control/ protection: Surgical     Comment: TLH/BSO   Other Topics Concern  . Not on file   Social History Narrative  . No narrative on file    Family History  Problem Relation Age of Onset  . Arthritis Mother   . Hyperlipidemia Mother   . Heart disease Mother   . Arthritis Father   . Lung cancer Father     smoker  . Heart disease Father   . Hypertension Father   . Diabetes Father   . Colon cancer Paternal Aunt   . CAD Paternal Grandmother   . Kidney disease Maternal Grandfather     ESRD on HD  . Kidney disease Maternal Grandmother     ESRD on HD    Review of Systems  Constitutional: Positive for chills (after vomiting) and unexpected weight change (related to not eating the days she throws up). Negative for appetite  change and fever.  Respiratory: Positive for shortness of breath (with walking long distances - does not exercise). Negative for cough and wheezing.   Cardiovascular: Positive for chest pain (occasionally for a long time). Negative for palpitations and leg swelling (occasionally from swelling).  Gastrointestinal: Positive for nausea and vomiting. Negative for abdominal pain, blood in stool, constipation and diarrhea.       No gerd - controlled with protonix daily  Neurological: Positive for light-headedness and headaches.       Objective:   Vitals:   02/22/16 0844  BP: 112/76  Pulse: 93  Resp: 16  Temp: 98.1 F (36.7 C)   Filed Weights   02/22/16 0844  Weight: 169 lb (76.7 kg)   Body mass index is 32.46 kg/m.   Physical Exam  Constitutional: She appears well-developed and well-nourished. No distress.  HENT:  Head: Normocephalic and atraumatic.  Eyes: Conjunctivae are normal.  Cardiovascular: Normal rate, regular rhythm and normal heart sounds.   Pulmonary/Chest: Effort normal. No respiratory distress. She has no wheezes. She has no rales.  Abdominal: Soft. She exhibits no distension and no mass. There is no tenderness. There is no rebound and no guarding.  Skin: Skin is warm. She is diaphoretic.          Assessment & Plan:   See Problem List for Assessment and Plan of chronic medical problems.

## 2016-02-22 NOTE — Assessment & Plan Note (Signed)
Several episodes without obvious pattern - ? GB disease Will check abd [place] If negative will consider further GI evaluation

## 2016-02-22 NOTE — Patient Instructions (Signed)
   Medications reviewed and updated.  No changes recommended at this time.   An abdominal ultrasound was ordered - you will hear from our office to schedule this.

## 2016-02-22 NOTE — Assessment & Plan Note (Signed)
GERD controlled Continue daily medication  

## 2016-02-22 NOTE — Progress Notes (Signed)
Pre visit review using our clinic review tool, if applicable. No additional management support is needed unless otherwise documented below in the visit note. 

## 2016-02-22 NOTE — Assessment & Plan Note (Signed)
Possible GB disease   Will check abd [place] to rule out GB disease

## 2016-03-08 ENCOUNTER — Ambulatory Visit
Admission: RE | Admit: 2016-03-08 | Discharge: 2016-03-08 | Disposition: A | Discharge status: Home / Self Care | Payer: 59 | Source: Ambulatory Visit | Attending: Internal Medicine | Admitting: Internal Medicine

## 2016-03-08 DIAGNOSIS — R112 Nausea with vomiting, unspecified: Secondary | ICD-10-CM | POA: Diagnosis not present

## 2016-03-08 DIAGNOSIS — R1011 Right upper quadrant pain: Secondary | ICD-10-CM

## 2016-03-08 DIAGNOSIS — R1010 Upper abdominal pain, unspecified: Secondary | ICD-10-CM | POA: Diagnosis not present

## 2016-03-09 ENCOUNTER — Encounter: Payer: Self-pay | Admitting: Internal Medicine

## 2016-03-09 DIAGNOSIS — K760 Fatty (change of) liver, not elsewhere classified: Secondary | ICD-10-CM | POA: Insufficient documentation

## 2016-03-29 ENCOUNTER — Other Ambulatory Visit: Payer: Self-pay | Admitting: Obstetrics & Gynecology

## 2016-03-29 MED FILL — DULoxetine HCL 60 MG CPEP: 60 | 90 days supply | Qty: 90 | Fill #2

## 2016-03-29 NOTE — Telephone Encounter (Signed)
Medication refill request: [place] Last AEX:  02/19/15 SM Next AEX: 05/23/16 SM Last MMG (if hormonal medication request): 02/05/15 [id number] Refill authorized: 02/19/15 #24 Patch 4R. Please advise. Thank you.

## 2016-03-30 MED FILL — MINIVELLE 0.1 MG PATCH: 0.1 | 28 days supply | Qty: 8 | Fill #0

## 2016-04-25 ENCOUNTER — Encounter: Payer: Self-pay | Admitting: Student

## 2016-05-10 MED FILL — MINIVELLE 0.1 MG PATCH: 0.1 | 28 days supply | Qty: 8 | Fill #1

## 2016-05-14 ENCOUNTER — Encounter: Payer: Self-pay | Admitting: Internal Medicine

## 2016-05-14 ENCOUNTER — Emergency Department (HOSPITAL_BASED_OUTPATIENT_CLINIC_OR_DEPARTMENT_OTHER)
Admission: EM | Admit: 2016-05-14 | Discharge: 2016-05-14 | Disposition: A | Discharge status: Home / Self Care | Payer: 59 | Attending: Emergency Medicine | Admitting: Emergency Medicine

## 2016-05-14 ENCOUNTER — Encounter (HOSPITAL_BASED_OUTPATIENT_CLINIC_OR_DEPARTMENT_OTHER): Payer: Self-pay | Admitting: Emergency Medicine

## 2016-05-14 DIAGNOSIS — Z79899 Other long term (current) drug therapy: Secondary | ICD-10-CM | POA: Diagnosis not present

## 2016-05-14 DIAGNOSIS — Z7982 Long term (current) use of aspirin: Secondary | ICD-10-CM | POA: Diagnosis not present

## 2016-05-14 DIAGNOSIS — W268XXA Contact with other sharp object(s), not elsewhere classified, initial encounter: Secondary | ICD-10-CM | POA: Insufficient documentation

## 2016-05-14 DIAGNOSIS — S61209A Unspecified open wound of unspecified finger without damage to nail, initial encounter: Secondary | ICD-10-CM

## 2016-05-14 DIAGNOSIS — S61211A Laceration without foreign body of left index finger without damage to nail, initial encounter: Secondary | ICD-10-CM | POA: Diagnosis not present

## 2016-05-14 DIAGNOSIS — Y929 Unspecified place or not applicable: Secondary | ICD-10-CM | POA: Insufficient documentation

## 2016-05-14 DIAGNOSIS — Z87891 Personal history of nicotine dependence: Secondary | ICD-10-CM | POA: Diagnosis not present

## 2016-05-14 DIAGNOSIS — Y939 Activity, unspecified: Secondary | ICD-10-CM | POA: Insufficient documentation

## 2016-05-14 DIAGNOSIS — R7303 Prediabetes: Secondary | ICD-10-CM | POA: Insufficient documentation

## 2016-05-14 DIAGNOSIS — Y999 Unspecified external cause status: Secondary | ICD-10-CM | POA: Diagnosis not present

## 2016-05-14 DIAGNOSIS — S6992XA Unspecified injury of left wrist, hand and finger(s), initial encounter: Secondary | ICD-10-CM | POA: Diagnosis present

## 2016-05-14 DIAGNOSIS — S61201A Unspecified open wound of left index finger without damage to nail, initial encounter: Secondary | ICD-10-CM | POA: Diagnosis not present

## 2016-05-14 MED ORDER — TETANUS-DIPHTH-ACELL PERTUSSIS 5-2.5-18.5 LF-MCG/0.5 IM SUSP
0.5000 mL | Freq: Once | INTRAMUSCULAR | Status: AC
  Administered 2016-05-14: 0.5 mL via INTRAMUSCULAR
  Filled 2016-05-14: qty 0.5

## 2016-05-14 NOTE — Discharge Instructions (Signed)
Please read and follow all provided instructions.  Your diagnoses today include:  1. Avulsion of finger tip, initial encounter    Tests performed today include: Vital signs. See below for your results today.   Medications prescribed:  Take as prescribed   Home care instructions:  Follow any educational materials contained in this packet.  Follow-up instructions: Please follow-up with your primary care provider for further evaluation of symptoms and treatment   Return instructions:  Please return to the Emergency Department if you do not get better, if you get worse, or new symptoms OR  - Fever (temperature greater than 101.28F)  - Bleeding that does not stop with holding pressure to the area    -Severe pain (please note that you may be more sore the day after your accident)  - Chest Pain  - Difficulty breathing  - Severe nausea or vomiting  - Inability to tolerate food and liquids  - Passing out  - Skin becoming red around your wounds  - Change in mental status (confusion or lethargy)  - New numbness or weakness    Please return if you have any other emergent concerns.  Additional Information:  Your vital signs today were: BP (!) 155/102    Pulse 81    Temp 98.3 F (36.8 C)    Resp 20    Ht 5' (1.524 m)    Wt 74.4 kg    LMP 09/24/2011    SpO2 94%    BMI 32.03 kg/m  If your blood pressure (BP) was elevated above 135/85 this visit, please have this repeated by your doctor within one month. ---------------

## 2016-05-14 NOTE — Progress Notes (Addendum)
Subjective:    Patient ID: Sarah Mullen, female    DOB: 26-Mar-1956, 60 y.o.   MRN: [id number]  HPI She is here for a physical exam.   She went to the ED last night after cutting her finger.  She had powder placed on it that made it clot because stitches were not possible.  She has some pain, but no new numbness/tingling.  There is no redness or swelling.  She denies fevers.   Emesis:  She continues to have intermittent nausea and vomiting.  She tried to stop her protonix to see if that was the cause - she was able to wean herself off.  She has not had nausea or vomiting in the past week since being off the medication.  She feels better overall and has lost weight.   She feels the weight loss was related to the vomiting.  Neck pain, back pain:  She is taking cymbalta and wonders if that is contributing how she is feeling overall.  She has lack of motivation and does not feel like her. She would like to come off of it. She has chronic numbness/tingling from her fingertips. She is going with surgery and eventually will need a cervical fusion. She started taking Cymbalta and mostly for her chronic neck, back and hip pain.  She is currently not exercising and not she needs to start to help her lose weight and to improve some of her chronic pain.  Medications and allergies reviewed with patient and updated if appropriate.  Patient Active Problem List   Diagnosis Date Noted  . Prediabetes 05/14/2016  . Fatty liver 03/09/2016  . Emesis 02/22/2016  . RUQ pain 02/22/2016  . Positive TB test 05/28/2012  . Pelvic pressure in female 03/22/2011  . GERD (gastroesophageal reflux disease) 11/21/2010  . Depression 11/21/2010  . Dyshidrotic eczema 11/21/2010    Current Outpatient Prescriptions on File Prior to Visit  Medication Sig Dispense Refill  . aspirin EC 81 MG tablet Take 81 mg by mouth daily.    . cetirizine (ZYRTEC) 10 MG tablet Take 10 mg by mouth daily as needed.     . DULoxetine  (CYMBALTA) 60 MG capsule TAKE 1 CAPSULE (60 MG TOTAL) BY MOUTH DAILY. 90 capsule 2  . MINIVELLE 0.1 MG/24HR patch PLACE 1 PATCH (0.1 MG TOTAL) ONTO THE SKIN 2 (TWO) TIMES A WEEK. 8 patch 1  . NONFORMULARY OR COMPOUNDED ITEM Testosterone HRT (3ML) 1% cream, Apply 1/2 ml topically every morning as directed.  #60 grams 1 each 5  . pantoprazole (PROTONIX) 40 MG tablet TAKE 1 TABLET (40 MG TOTAL) BY MOUTH DAILY. 90 tablet 2  . [DISCONTINUED] progesterone (PROMETRIUM) 200 MG capsule Take 200 mg by mouth daily.       No current facility-administered medications on file prior to visit.     Past Medical History:  Diagnosis Date  . Allergic rhinitis, cause unspecified   . Blood transfusion    iron transfusion- [place]-2009  . Chronic headaches   . Depression   . Diverticulitis   . GERD (gastroesophageal reflux disease)   . Heart murmur    diag with echo-physiologic murmur  . History of sinus tachycardia    copy of holter monitor results on chart  . Menometrorrhagia 11/21/2010  . Migraines     Past Surgical History:  Procedure Laterality Date  . ABDOMINAL HYSTERECTOMY  09/2011  . BREAST BIOPSY    . CERCLAGE REMOVAL    . CESAREAN SECTION    .  CYSTOSCOPY  10/17/2011   Procedure: CYSTOSCOPY;  Surgeon: Megan Salon, MD;  Location: [facility];  Service: Gynecology;;  . DILATION AND CURETTAGE OF UTERUS    . TONSILLECTOMY      Social History   Social History  . Marital status: Single    Spouse name: N/A  . Number of children: N/A  . Years of education: N/A   Social History Main Topics  . Smoking status: Former Smoker    Types: Cigarettes    Quit date: 10/09/2001  . Smokeless tobacco: Never Used  . Alcohol use No  . Drug use: No  . Sexual activity: Yes    Partners: Male    Birth control/ protection: Surgical     Comment: TLH/BSO   Other Topics Concern  . None   Social History Narrative  . None    Family History  Problem Relation Age of Onset  . Arthritis Mother   .  Hyperlipidemia Mother   . Heart disease Mother   . Arthritis Father   . Lung cancer Father     smoker  . Heart disease Father   . Hypertension Father   . Diabetes Father   . Colon cancer Paternal Aunt   . CAD Paternal Grandmother   . Kidney disease Maternal Grandfather     ESRD on HD  . Kidney disease Maternal Grandmother     ESRD on HD    Review of Systems  Constitutional: Positive for unexpected weight change (10 lbs in copule of months - slow - related to N/V which is better). Negative for appetite change, chills and fever.  Eyes: Negative for visual disturbance.  Respiratory: Positive for shortness of breath (related to deconditioning). Negative for cough and wheezing.   Cardiovascular: Positive for leg swelling (sometimes, salt intake). Negative for chest pain and palpitations.  Gastrointestinal: Negative for abdominal pain, blood in stool, constipation, diarrhea, nausea and vomiting.       Occ GERD  Endocrine: Negative for polydipsia and polyuria.  Genitourinary: Negative for dysuria and hematuria.  Musculoskeletal: Positive for arthralgias (hip pain) and back pain (intermittent - lower back). Negative for neck pain (no pain with cymbalta 60 mg).  Skin: Positive for rash (eczema).  Neurological: Positive for dizziness (mild, transient), numbness (finger tips) and headaches. Negative for light-headedness.  Psychiatric/Behavioral: Negative for decreased concentration. The patient is not nervous/anxious.        Objective:   Vitals:   05/15/16 0846  BP: 112/66  Pulse: 92  Resp: 16  Temp: 98.7 F (37.1 C)   Filed Weights   05/15/16 0846  Weight: 169 lb (76.7 kg)   Body mass index is 31.93 kg/m.   Physical Exam Constitutional: She appears well-developed and well-nourished. No distress.  HENT:  Head: Normocephalic and atraumatic.  Right Ear: External ear normal. Normal ear canal and TM Left Ear: External ear normal.  Normal ear canal and TM Mouth/Throat:  Oropharynx is clear and moist.  Eyes: Conjunctivae and EOM are normal.  Neck: Neck supple. No tracheal deviation present. No thyromegaly present.  No carotid bruit  Cardiovascular: Normal rate, regular rhythm and normal heart sounds.   No murmur heard.  No edema. Pulmonary/Chest: Effort normal and breath sounds normal. No respiratory distress. She has no wheezes. She has no rales.  Breast: deferred to Gyn Abdominal: Soft. She exhibits no distension. There is no tenderness.  Lymphadenopathy: She has no cervical adenopathy.  Skin: Skin is warm and dry. She is not diaphoretic.  mild eczema  left lower leg. Left index finger with laceration with dried blood, no discharge or bleeding, no surrounding erythema or swelling  Psychiatric: She has a normal mood and affect. Her behavior is normal.         Assessment & Plan:   Physical exam: Screening blood work  ordered Immunizations will get shingles vaccine Colonoscopy Up to date  Mammogram   Up to date  Gyn  Has appointment scheduled Dexa  -done 05/2015, normal Eye exams  Up to date EKG - done 2013 Exercise - none Weight - working on weight loss Skin  - saw derm - has eczema, no other concerns Substance abuse - none  She will continue to watch her finger closely for evidence of infection. Instructed on wound care the emergency room. Tetanus was updated in the emergency room.  See Problem List for Assessment and Plan of chronic medical problems.

## 2016-05-14 NOTE — Patient Instructions (Addendum)
Test(s) ordered today. Your results will be released to [facility] (or called to you) after review, usually within 72hours after test completion. If any changes need to be made, you will be notified at that same time.  All other Health Maintenance issues reviewed.   All recommended immunizations and age-appropriate screenings are up-to-date or discussed.  No immunizations administered today.   Medications reviewed and updated.  Changes include decreasing the cymbalta 40 mg a day. You can taper off the medication slowly.   Your prescription(s) have been submitted to your pharmacy. Please take as directed and contact our office if you believe you are having problem(s) with the medication(s).   Please followup in one year  Health Maintenance, Female Adopting a healthy lifestyle and getting preventive care can go a long way to promote health and wellness. Talk with your health care provider about what schedule of regular examinations is right for you. This is a good chance for you to check in with your provider about disease prevention and staying healthy. In between checkups, there are plenty of things you can do on your own. Experts have done a lot of research about which lifestyle changes and preventive measures are most likely to keep you healthy. Ask your health care provider for more information. WEIGHT AND DIET  Eat a healthy diet  Be sure to include plenty of vegetables, fruits, low-fat dairy products, and lean protein.  Do not eat a lot of foods high in solid fats, added sugars, or salt.  Get regular exercise. This is one of the most important things you can do for your health.  Most adults should exercise for at least 150 minutes each week. The exercise should increase your heart rate and make you sweat (moderate-intensity exercise).  Most adults should also do strengthening exercises at least twice a week. This is in addition to the moderate-intensity exercise.  Maintain a healthy  weight  Body mass index (BMI) is a measurement that can be used to identify possible weight problems. It estimates body fat based on height and weight. Your health care provider can help determine your BMI and help you achieve or maintain a healthy weight.  For females 30 years of age and older:   A BMI below 18.5 is considered underweight.  A BMI of 18.5 to 24.9 is normal.  A BMI of 25 to 29.9 is considered overweight.  A BMI of 30 and above is considered obese.  Watch levels of cholesterol and blood lipids  You should start having your blood tested for lipids and cholesterol at 61 years of age, then have this test every 5 years.  You may need to have your cholesterol levels checked more often if:  Your lipid or cholesterol levels are high.  You are older than 61 years of age.  You are at high risk for heart disease.  CANCER SCREENING   Lung Cancer  Lung cancer screening is recommended for adults 63-56 years old who are at high risk for lung cancer because of a history of smoking.  A yearly low-dose CT scan of the lungs is recommended for people who:  Currently smoke.  Have quit within the past 15 years.  Have at least a 30-pack-year history of smoking. A pack year is smoking an average of one pack of cigarettes a day for 1 year.  Yearly screening should continue until it has been 15 years since you quit.  Yearly screening should stop if you develop a health problem that would  prevent you from having lung cancer treatment.  Breast Cancer  Practice breast self-awareness. This means understanding how your breasts normally appear and feel.  It also means doing regular breast self-exams. Let your health care provider know about any changes, no matter how small.  If you are in your 20s or 30s, you should have a clinical breast exam (CBE) by a health care provider every 1-3 years as part of a regular health exam.  If you are 56 or older, have a CBE every year. Also  consider having a breast X-ray (mammogram) every year.  If you have a family history of breast cancer, talk to your health care provider about genetic screening.  If you are at high risk for breast cancer, talk to your health care provider about having an MRI and a mammogram every year.  Breast cancer gene (BRCA) assessment is recommended for women who have family members with BRCA-related cancers. BRCA-related cancers include:  Breast.  Ovarian.  Tubal.  Peritoneal cancers.  Results of the assessment will determine the need for genetic counseling and BRCA1 and BRCA2 testing. Cervical Cancer Your health care provider may recommend that you be screened regularly for cancer of the pelvic organs (ovaries, uterus, and vagina). This screening involves a pelvic examination, including checking for microscopic changes to the surface of your cervix (Pap test). You may be encouraged to have this screening done every 3 years, beginning at age 68.  For women ages 34-65, health care providers may recommend pelvic exams and Pap testing every 3 years, or they may recommend the Pap and pelvic exam, combined with testing for human papilloma virus (HPV), every 5 years. Some types of HPV increase your risk of cervical cancer. Testing for HPV may also be done on women of any age with unclear Pap test results.  Other health care providers may not recommend any screening for nonpregnant women who are considered low risk for pelvic cancer and who do not have symptoms. Ask your health care provider if a screening pelvic exam is right for you.  If you have had past treatment for cervical cancer or a condition that could lead to cancer, you need Pap tests and screening for cancer for at least 20 years after your treatment. If Pap tests have been discontinued, your risk factors (such as having a new sexual partner) need to be reassessed to determine if screening should resume. Some women have medical problems that  increase the chance of getting cervical cancer. In these cases, your health care provider may recommend more frequent screening and Pap tests. Colorectal Cancer  This type of cancer can be detected and often prevented.  Routine colorectal cancer screening usually begins at 61 years of age and continues through 61 years of age.  Your health care provider may recommend screening at an earlier age if you have risk factors for colon cancer.  Your health care provider may also recommend using home test kits to check for hidden blood in the stool.  A small camera at the end of a tube can be used to examine your colon directly (sigmoidoscopy or colonoscopy). This is done to check for the earliest forms of colorectal cancer.  Routine screening usually begins at age 87.  Direct examination of the colon should be repeated every 5-10 years through 61 years of age. However, you may need to be screened more often if early forms of precancerous polyps or small growths are found. Skin Cancer  Check your skin from head  to toe regularly.  Tell your health care provider about any new moles or changes in moles, especially if there is a change in a mole's shape or color.  Also tell your health care provider if you have a mole that is larger than the size of a pencil eraser.  Always use sunscreen. Apply sunscreen liberally and repeatedly throughout the day.  Protect yourself by wearing long sleeves, pants, a wide-brimmed hat, and sunglasses whenever you are outside. HEART DISEASE, DIABETES, AND HIGH BLOOD PRESSURE   High blood pressure causes heart disease and increases the risk of stroke. High blood pressure is more likely to develop in:  People who have blood pressure in the high end of the normal range (130-139/85-89 mm Hg).  People who are overweight or obese.  People who are African American.  If you are 22-60 years of age, have your blood pressure checked every 3-5 years. If you are 27 years of  age or older, have your blood pressure checked every year. You should have your blood pressure measured twice--once when you are at a hospital or clinic, and once when you are not at a hospital or clinic. Record the average of the two measurements. To check your blood pressure when you are not at a hospital or clinic, you can use:  An automated blood pressure machine at a pharmacy.  A home blood pressure monitor.  If you are between 48 years and 55 years old, ask your health care provider if you should take aspirin to prevent strokes.  Have regular diabetes screenings. This involves taking a blood sample to check your fasting blood sugar level.  If you are at a normal weight and have a low risk for diabetes, have this test once every three years after 61 years of age.  If you are overweight and have a high risk for diabetes, consider being tested at a younger age or more often. PREVENTING INFECTION  Hepatitis B  If you have a higher risk for hepatitis B, you should be screened for this virus. You are considered at high risk for hepatitis B if:  You were born in a country where hepatitis B is common. Ask your health care provider which countries are considered high risk.  Your parents were born in a high-risk country, and you have not been immunized against hepatitis B (hepatitis B vaccine).  You have HIV or AIDS.  You use needles to inject street drugs.  You live with someone who has hepatitis B.  You have had sex with someone who has hepatitis B.  You get hemodialysis treatment.  You take certain medicines for conditions, including cancer, organ transplantation, and autoimmune conditions. Hepatitis C  Blood testing is recommended for:  Everyone born from [birth year] through 1966.  Anyone with known risk factors for hepatitis C. Sexually transmitted infections (STIs)  You should be screened for sexually transmitted infections (STIs) including gonorrhea and chlamydia if:  You are  sexually active and are younger than 61 years of age.  You are older than 61 years of age and your health care provider tells you that you are at risk for this type of infection.  Your sexual activity has changed since you were last screened and you are at an increased risk for chlamydia or gonorrhea. Ask your health care provider if you are at risk.  If you do not have HIV, but are at risk, it may be recommended that you take a prescription medicine daily to prevent HIV infection.  This is called pre-exposure prophylaxis (PrEP). You are considered at risk if:  You are sexually active and do not regularly use condoms or know the HIV status of your partner(s).  You take drugs by injection.  You are sexually active with a partner who has HIV. Talk with your health care provider about whether you are at high risk of being infected with HIV. If you choose to begin PrEP, you should first be tested for HIV. You should then be tested every 3 months for as long as you are taking PrEP.  PREGNANCY   If you are premenopausal and you may become pregnant, ask your health care provider about preconception counseling.  If you may become pregnant, take 400 to 800 micrograms (mcg) of folic acid every day.  If you want to prevent pregnancy, talk to your health care provider about birth control (contraception). OSTEOPOROSIS AND MENOPAUSE   Osteoporosis is a disease in which the bones lose minerals and strength with aging. This can result in serious bone fractures. Your risk for osteoporosis can be identified using a bone density scan.  If you are 33 years of age or older, or if you are at risk for osteoporosis and fractures, ask your health care provider if you should be screened.  Ask your health care provider whether you should take a calcium or vitamin D supplement to lower your risk for osteoporosis.  Menopause may have certain physical symptoms and risks.  Hormone replacement therapy may reduce some  of these symptoms and risks. Talk to your health care provider about whether hormone replacement therapy is right for you.  HOME CARE INSTRUCTIONS   Schedule regular health, dental, and eye exams.  Stay current with your immunizations.   Do not use any tobacco products including cigarettes, chewing tobacco, or electronic cigarettes.  If you are pregnant, do not drink alcohol.  If you are breastfeeding, limit how much and how often you drink alcohol.  Limit alcohol intake to no more than 1 drink per day for nonpregnant women. One drink equals 12 ounces of beer, 5 ounces of wine, or 1 ounces of hard liquor.  Do not use street drugs.  Do not share needles.  Ask your health care provider for help if you need support or information about quitting drugs.  Tell your health care provider if you often feel depressed.  Tell your health care provider if you have ever been abused or do not feel safe at home.   This information is not intended to replace advice given to you by your health care provider. Make sure you discuss any questions you have with your health care provider.   Document Released: 01/23/2011 Document Revised: 07/31/2014 Document Reviewed: 06/11/2013 Elsevier Interactive Patient Education [employer].

## 2016-05-14 NOTE — Assessment & Plan Note (Signed)
a1c last year 5.9% Recheck a1c Low sugar/carb diet, regular exercise stressed

## 2016-05-14 NOTE — ED Notes (Signed)
Pt was cutting eggplant and cut the tip of her Lt index finger, pt denies numbness and tingling.  Pt can move finger.

## 2016-05-14 NOTE — ED Provider Notes (Signed)
[facility] Provider Note   CSN: [id number] Arrival date & time: 05/14/16  2045  By signing my name below, I, Soijett Blue, attest that this documentation has been prepared under the direction and in the presence of Shary Decamp, PA-C Electronically Signed: Soijett Blue, ED Scribe. 05/14/16. 9:14 PM.   History   Chief Complaint Chief Complaint  Patient presents with  . Hand Injury   HPI Sarah Mullen is a 60 y.o. female who presents to the Emergency Department complaining of left hand injury occurring 1.5 hours ago PTA. Pt notes that she was using a mandolin to cut veggies when she cut her left index finger. Pt notes that there is a throbbing sensation to her left index finger. Pt states that she takes 81 mg ASA daily. Per chart review, pt last tetanus vaccination was 02/20/2011. She notes that she has tried applying pressure without medications for the relief of her symptoms. She denies swelling, color change, and any other symptoms.   The history is provided by the patient. No language interpreter was used.   Past Medical History:  Diagnosis Date  . Allergic rhinitis, cause unspecified   . Blood transfusion    iron transfusion- [place]-2009  . Chronic headaches   . Depression   . Diverticulitis   . GERD (gastroesophageal reflux disease)   . Heart murmur    diag with echo-physiologic murmur  . History of sinus tachycardia    copy of holter monitor results on chart  . Menometrorrhagia 11/21/2010  . Migraines     Patient Active Problem List   Diagnosis Date Noted  . Prediabetes 05/14/2016  . Fatty liver 03/09/2016  . Emesis 02/22/2016  . RUQ pain 02/22/2016  . Positive TB test 05/28/2012  . Pelvic pressure in female 03/22/2011  . GERD (gastroesophageal reflux disease) 11/21/2010  . Depression 11/21/2010  . Dyshidrotic eczema 11/21/2010    Past Surgical History:  Procedure Laterality Date  . ABDOMINAL HYSTERECTOMY  09/2011  . BREAST BIOPSY    .  CERCLAGE REMOVAL    . CESAREAN SECTION    . CYSTOSCOPY  10/17/2011   Procedure: CYSTOSCOPY;  Surgeon: Megan Salon, MD;  Location: [facility];  Service: Gynecology;;  . DILATION AND CURETTAGE OF UTERUS    . TONSILLECTOMY      OB History    Gravida Para Term Preterm AB Living   7 1       1    SAB TAB Ectopic Multiple Live Births                   Home Medications    Prior to Admission medications   Medication Sig Start Date End Date Taking? Authorizing Provider  aspirin EC 81 MG tablet Take 81 mg by mouth daily.    Historical Provider, MD  cetirizine (ZYRTEC) 10 MG tablet Take 10 mg by mouth daily.      Historical Provider, MD  DULoxetine (CYMBALTA) 60 MG capsule TAKE 1 CAPSULE (60 MG TOTAL) BY MOUTH DAILY. 09/03/15   Binnie Rail, MD  MINIVELLE 0.1 MG/24HR patch PLACE 1 PATCH (0.1 MG TOTAL) ONTO THE SKIN 2 (TWO) TIMES A WEEK. 03/30/16   Nunzio Cobbs, MD  NONFORMULARY OR COMPOUNDED ITEM Testosterone HRT (3ML) 1% cream, Apply 1/2 ml topically every morning as directed.  #60 grams 03/08/15   Megan Salon, MD  pantoprazole (PROTONIX) 40 MG tablet TAKE 1 TABLET (40 MG TOTAL) BY MOUTH DAILY. 09/03/15  Binnie Rail, MD    Family History Family History  Problem Relation Age of Onset  . Arthritis Mother   . Hyperlipidemia Mother   . Heart disease Mother   . Arthritis Father   . Lung cancer Father     smoker  . Heart disease Father   . Hypertension Father   . Diabetes Father   . Colon cancer Paternal Aunt   . CAD Paternal Grandmother   . Kidney disease Maternal Grandfather     ESRD on HD  . Kidney disease Maternal Grandmother     ESRD on HD    Social History Social History  Substance Use Topics  . Smoking status: Former Smoker    Types: Cigarettes    Quit date: 10/09/2001  . Smokeless tobacco: Never Used  . Alcohol use No     Allergies   Food; Contrast media [iodinated diagnostic agents]; and Keflex [cephalexin]   Review of Systems Review of Systems A  complete 10 system review of systems was obtained and all systems are negative except as noted in the HPI and PMH.   Physical Exam Updated Vital Signs BP (!) 155/102   Pulse 81   Temp 98.3 F (36.8 C)   Resp 20   Ht 5' (1.524 m)   Wt 164 lb (74.4 kg)   LMP 09/24/2011   SpO2 94%   BMI 32.03 kg/m   Physical Exam  Constitutional: She is oriented to person, place, and time. She appears well-developed and well-nourished. No distress.  HENT:  Head: Normocephalic and atraumatic.  Eyes: EOM are normal.  Neck: Neck supple.  Cardiovascular: Normal rate.   Pulmonary/Chest: Effort normal. No respiratory distress.  Abdominal: She exhibits no distension.  Musculoskeletal: Normal range of motion.  Neurological: She is alert and oriented to person, place, and time.  Skin: Skin is warm and dry.  Avulsion of skin on distal aspect of left index finger. Actively bleeding. Distal portion of nail avulsed as well. ROM intact. NVI.  Psychiatric: She has a normal mood and affect. Her behavior is normal.  Nursing note and vitals reviewed.  ED Treatments / Results  DIAGNOSTIC STUDIES: Oxygen Saturation is 94% on RA, adequate by my interpretation.    COORDINATION OF CARE: 9:11 PM Discussed treatment plan with pt at bedside which includes wound care, update tetanus, and pt agreed to plan.   Procedures .Marland KitchenLaceration Repair Date/Time: 05/14/2016 9:58 PM Performed by: Shary Decamp Authorized by: Shary Decamp   Consent:    Consent obtained:  Verbal   Consent given by:  Patient   Risks discussed:  Need for additional repair Anesthesia (see MAR for exact dosages):    Anesthesia method:  None Laceration details:    Location:  Finger   Finger location:  L index finger Repair type:    Repair type:  Intermediate Pre-procedure details:    Preparation:  Patient was prepped and draped in usual sterile fashion Exploration:    Hemostasis achieved with:  Direct pressure and tourniquet (Quikclot)   Wound  exploration: wound explored through full range of motion     Wound extent: no foreign bodies/material noted     Contaminated: no   Treatment:    Area cleansed with:  Betadine   Amount of cleaning:  Standard   Irrigation solution:  Sterile saline   Irrigation method:  Syringe   Visualized foreign bodies/material removed: no   Skin repair:    Repair method: Quikclot. Post-procedure details:    Dressing:  Sterile  dressing   Patient tolerance of procedure:  Tolerated well, no immediate complications   (including critical care time)  Medications Ordered in ED Medications - No data to display   Initial Impression / Assessment and Plan / ED Course  I have reviewed the triage vital signs and the nursing notes.  Clinical Course   I have reviewed the relevant previous healthcare records. I obtained HPI from historian.  ED Course:  Assessment: Avulsion of finger tip from mandolin <1 hour ago. Tetanus updated in ED. Laceration occurred < 12 hours prior to repair. Pressure irrigation performed. Bottom of the wound visualized with bleeding controled. Applied direct pressure, applied tourniquet, and applied quick clot with relief of bleeding. Discussed laceration care with pt and answered questions. Pt to f-u with PCP wound check sooner should there be signs of dehiscence or infection. Pt is hemodynamically stable with no complaints prior to dc.      Disposition/Plan:  DC Home Additional Verbal discharge instructions given and discussed with patient.  Pt Instructed to f/u with PCP in the next week for evaluation and treatment of symptoms. Return precautions given Pt acknowledges and agrees with plan  Supervising Physician Gareth Morgan, MD  Final Clinical Impressions(s) / ED Diagnoses   Final diagnoses:  Avulsion of finger tip, initial encounter    New Prescriptions New Prescriptions   No medications on file   I personally performed the services described in this  documentation, which was scribed in my presence. The recorded information has been reviewed and is accurate.     Shary Decamp, PA-C 05/14/16 [id number]    Gareth Morgan, MD 05/15/16 [phone]

## 2016-05-14 NOTE — ED Triage Notes (Signed)
Pt in c/o tip amputation of L 2nd digit, some nail included, actively bleeding approx 1 hour after injury. Pt is alert, interactive, ambulatory in NAD.

## 2016-05-15 ENCOUNTER — Encounter: Payer: Self-pay | Admitting: Internal Medicine

## 2016-05-15 ENCOUNTER — Encounter: Payer: Self-pay | Admitting: Obstetrics & Gynecology

## 2016-05-15 ENCOUNTER — Ambulatory Visit (INDEPENDENT_AMBULATORY_CARE_PROVIDER_SITE_OTHER): Payer: 59 | Admitting: Internal Medicine

## 2016-05-15 ENCOUNTER — Other Ambulatory Visit: Payer: Self-pay | Admitting: Internal Medicine

## 2016-05-15 ENCOUNTER — Other Ambulatory Visit (INDEPENDENT_AMBULATORY_CARE_PROVIDER_SITE_OTHER): Payer: 59

## 2016-05-15 VITALS — BP 112/66 | HR 92 | Temp 98.7°F | Resp 16 | Ht 61.0 in | Wt 169.0 lb

## 2016-05-15 DIAGNOSIS — Z0000 Encounter for general adult medical examination without abnormal findings: Secondary | ICD-10-CM

## 2016-05-15 DIAGNOSIS — F329 Major depressive disorder, single episode, unspecified: Secondary | ICD-10-CM

## 2016-05-15 DIAGNOSIS — F32A Depression, unspecified: Secondary | ICD-10-CM

## 2016-05-15 DIAGNOSIS — G959 Disease of spinal cord, unspecified: Secondary | ICD-10-CM | POA: Insufficient documentation

## 2016-05-15 DIAGNOSIS — R112 Nausea with vomiting, unspecified: Secondary | ICD-10-CM

## 2016-05-15 DIAGNOSIS — R7303 Prediabetes: Secondary | ICD-10-CM

## 2016-05-15 DIAGNOSIS — M5412 Radiculopathy, cervical region: Secondary | ICD-10-CM

## 2016-05-15 DIAGNOSIS — K219 Gastro-esophageal reflux disease without esophagitis: Secondary | ICD-10-CM | POA: Diagnosis not present

## 2016-05-15 DIAGNOSIS — E559 Vitamin D deficiency, unspecified: Secondary | ICD-10-CM | POA: Insufficient documentation

## 2016-05-15 LAB — VITAMIN D 25 HYDROXY (VIT D DEFICIENCY, FRACTURES): VITD: 14.44 ng/mL — ABNORMAL LOW (ref 30.00–100.00)

## 2016-05-15 LAB — COMPREHENSIVE METABOLIC PANEL
ALT: 35 U/L (ref 0–35)
AST: 24 U/L (ref 0–37)
Albumin: 4.6 g/dL (ref 3.5–5.2)
Alkaline Phosphatase: 87 U/L (ref 39–117)
BILIRUBIN TOTAL: 0.4 mg/dL (ref 0.2–1.2)
BUN: 20 mg/dL (ref 6–23)
CHLORIDE: 104 meq/L (ref 96–112)
CO2: 29 meq/L (ref 19–32)
Calcium: 9.9 mg/dL (ref 8.4–10.5)
Creatinine, Ser: 0.79 mg/dL (ref 0.40–1.20)
GFR: 78.67 mL/min (ref >60.00)
GLUCOSE: 101 mg/dL — AB (ref 70–99)
Potassium: 4.1 mEq/L (ref 3.5–5.1)
Sodium: 140 mEq/L (ref 135–145)
Total Protein: 7.3 g/dL (ref 6.0–8.3)

## 2016-05-15 LAB — CBC WITH DIFFERENTIAL/PLATELET
BASOS ABS: 0 10*3/uL (ref 0.0–0.1)
Basophils Relative: 0.3 % (ref 0.0–3.0)
EOS ABS: 0.1 10*3/uL (ref 0.0–0.7)
Eosinophils Relative: 1.5 % (ref 0.0–5.0)
HCT: 43.2 % (ref 36.0–46.0)
Hemoglobin: 14.7 g/dL (ref 12.0–15.0)
LYMPHS ABS: 2.4 10*3/uL (ref 0.7–4.0)
LYMPHS PCT: 30.8 % (ref 12.0–46.0)
MCHC: 33.9 g/dL (ref 30.0–36.0)
MCV: 87.2 fl (ref 78.0–100.0)
Monocytes Absolute: 0.4 10*3/uL (ref 0.1–1.0)
Monocytes Relative: 5.4 % (ref 3.0–12.0)
NEUTROS ABS: 4.8 10*3/uL (ref 1.4–7.7)
NEUTROS PCT: 62 % (ref 43.0–77.0)
PLATELETS: 352 10*3/uL (ref 150.0–400.0)
RBC: 4.95 Mil/uL (ref 3.87–5.11)
RDW: 13.7 % (ref 11.5–15.5)
WBC: 7.7 10*3/uL (ref 4.0–10.5)

## 2016-05-15 LAB — LIPID PANEL
CHOL/HDL RATIO: 4
Cholesterol: 188 mg/dL (ref 0–200)
HDL: 52.1 mg/dL (ref >39.00)
LDL CALC: 118 mg/dL — AB (ref 0–99)
NonHDL: 135.8
TRIGLYCERIDES: 87 mg/dL (ref 0.0–149.0)
VLDL: 17.4 mg/dL (ref 0.0–40.0)

## 2016-05-15 LAB — HEMOGLOBIN A1C: Hgb A1c MFr Bld: 6 % (ref 4.6–6.5)

## 2016-05-15 LAB — TSH: TSH: 1.74 u[IU]/mL (ref 0.35–4.50)

## 2016-05-15 MED ORDER — VITAMIN D (ERGOCALCIFEROL) 1.25 MG (50000 UNIT) PO CAPS: 50000.0000 [IU] | ORAL_CAPSULE | ORAL | 0 refills | Status: DC

## 2016-05-15 MED ORDER — DULOXETINE HCL 20 MG PO CPEP: 40.0000 mg | ORAL_CAPSULE | Freq: Every day | ORAL | 3 refills | Status: DC

## 2016-05-15 MED FILL — DULoxetine HCL 20 MG CPEP: 20 | 90 days supply | Qty: 180 | Fill #0

## 2016-05-15 NOTE — Assessment & Plan Note (Addendum)
Chronic nubmness/tingling in finger tips Following with surgery Will eventually need cervical fusion

## 2016-05-15 NOTE — Assessment & Plan Note (Signed)
Improved with coming off protonix Will monitor off medication

## 2016-05-15 NOTE — Progress Notes (Signed)
Pre visit review using our clinic review tool, if applicable. No additional management support is needed unless otherwise documented below in the visit note. 

## 2016-05-15 NOTE — Assessment & Plan Note (Signed)
Tapered off protonix - concerned it was causing nausea/vomiting - which have improved off the medication Having occasional GERD Will treat GERD with natural remedies - consider zantac if needed Continue weight loss efforts

## 2016-05-15 NOTE — Assessment & Plan Note (Signed)
No active depression symptoms Will try tapering off cymbalta - concern it is causing decreased motivation Monitor for depression

## 2016-05-16 MED FILL — VIT D2 1.25 MG (50,000 UNIT: 1.25 MG | 56 days supply | Qty: 8 | Fill #0

## 2016-05-16 NOTE — Telephone Encounter (Signed)
Please advise on Vitman D dose. Flu shot has already been documented.

## 2016-05-17 DIAGNOSIS — Z1231 Encounter for screening mammogram for malignant neoplasm of breast: Secondary | ICD-10-CM | POA: Diagnosis not present

## 2016-05-17 LAB — HM MAMMOGRAPHY

## 2016-05-19 ENCOUNTER — Encounter: Payer: Self-pay | Admitting: Internal Medicine

## 2016-05-23 ENCOUNTER — Ambulatory Visit (INDEPENDENT_AMBULATORY_CARE_PROVIDER_SITE_OTHER): Payer: 59 | Admitting: Obstetrics & Gynecology

## 2016-05-23 ENCOUNTER — Encounter: Payer: Self-pay | Admitting: Obstetrics & Gynecology

## 2016-05-23 VITALS — BP 110/76 | HR 78 | Resp 14 | Ht 60.5 in | Wt 166.4 lb

## 2016-05-23 DIAGNOSIS — Z01419 Encounter for gynecological examination (general) (routine) without abnormal findings: Secondary | ICD-10-CM | POA: Diagnosis not present

## 2016-05-23 MED ORDER — MINIVELLE 0.1 MG/24HR TD PTTW: 1.0000 | MEDICATED_PATCH | TRANSDERMAL | 13 refills | Status: DC

## 2016-05-23 MED ORDER — NONFORMULARY OR COMPOUNDED ITEM: 5 refills | Status: DC

## 2016-05-23 NOTE — Progress Notes (Signed)
Prescription for testosterone cream faxed to [facility] for patient. Fax number: [phone].

## 2016-05-23 NOTE — Progress Notes (Signed)
60 y.o. G71P1 SingleCaucasianF here for annual exam.  Doing well except for recent finger laceration with a mandolin.  Denies vaginal bleeding.    Patient's last menstrual period was 09/24/2011.          Sexually active: Yes.    The current method of family planning is status post hysterectomy.    Exercising: No.  The patient does not participate in regular exercise at present. Smoker:  no  Health Maintenance: Pap:  2011 negative  History of abnormal Pap:  yes MMG:  05/17/16  Colonoscopy:  2010- repeat 10 years  BMD:   02/05/15 normal- repeat 3-5 years TDaP:  05/14/16  Pneumonia vaccine(s):  never Zostavax:   Never, will be getting soon with PCP  Hep C testing: 05/28/15 negative  Screening Labs: PCP, Hb today: PCP, Urine today: declined   reports that she quit smoking about 14 years ago. Her smoking use included Cigarettes. She has never used smokeless tobacco. She reports that she does not drink alcohol or use drugs.  Past Medical History:  Diagnosis Date  . Allergic rhinitis, cause unspecified   . Blood transfusion    iron transfusion- [place]-2009  . Chronic headaches   . Depression   . Diverticulitis   . GERD (gastroesophageal reflux disease)   . Heart murmur    diag with echo-physiologic murmur  . History of sinus tachycardia    copy of holter monitor results on chart  . Menometrorrhagia 11/21/2010  . Migraines     Past Surgical History:  Procedure Laterality Date  . ABDOMINAL HYSTERECTOMY  09/2011  . BREAST BIOPSY    . CERCLAGE REMOVAL    . CESAREAN SECTION    . CYSTOSCOPY  10/17/2011   Procedure: CYSTOSCOPY;  Surgeon: Megan Salon, MD;  Location: [facility];  Service: Gynecology;;  . DILATION AND CURETTAGE OF UTERUS    . TONSILLECTOMY      Current Outpatient Prescriptions  Medication Sig Dispense Refill  . aspirin EC 81 MG tablet Take 81 mg by mouth daily.    . cetirizine (ZYRTEC) 10 MG tablet Take 10 mg by mouth daily as needed.     . DULoxetine  (CYMBALTA) 20 MG capsule Take 2 capsules (40 mg total) by mouth daily. 180 capsule 3  . MINIVELLE 0.1 MG/24HR patch PLACE 1 PATCH (0.1 MG TOTAL) ONTO THE SKIN 2 (TWO) TIMES A WEEK. 8 patch 1  . NONFORMULARY OR COMPOUNDED ITEM Testosterone HRT (3ML) 1% cream, Apply 1/2 ml topically every morning as directed.  #60 grams 1 each 5  . Vitamin D, Ergocalciferol, (DRISDOL) 50000 units CAPS capsule Take 1 capsule (50,000 Units total) by mouth every 7 (seven) days. 8 capsule 0   No current facility-administered medications for this visit.     Family History  Problem Relation Age of Onset  . Arthritis Mother   . Hyperlipidemia Mother   . Heart disease Mother   . Arthritis Father   . Lung cancer Father     smoker  . Heart disease Father   . Hypertension Father   . Diabetes Father   . Colon cancer Paternal Aunt   . CAD Paternal Grandmother   . Kidney disease Maternal Grandfather     ESRD on HD  . Kidney disease Maternal Grandmother     ESRD on HD    ROS:  Pertinent items are noted in HPI.  Otherwise, a comprehensive ROS was negative.  Exam:   BP 110/76 (BP Location: Right Arm, Patient Position:  Sitting, Cuff Size: Normal)   Pulse 78   Resp 14   Ht 5' 0.5" (1.537 m)   Wt 166 lb 6.4 oz (75.5 kg)   LMP 09/24/2011   BMI 31.96 kg/m   Weight change: -5#    Height: 5' 0.5" (153.7 cm)  Ht Readings from Last 3 Encounters:  05/23/16 5' 0.5" (1.537 m)  05/15/16 5\' 1"  (1.549 m)  05/14/16 5' (1.524 m)    General appearance: alert, cooperative and appears stated age Head: Normocephalic, without obvious abnormality, atraumatic Neck: no adenopathy, supple, symmetrical, trachea midline and thyroid normal to inspection and palpation Lungs: clear to auscultation bilaterally Breasts: normal appearance, no masses or tenderness Heart: regular rate and rhythm Abdomen: soft, non-tender; bowel sounds normal; no masses,  no organomegaly Extremities: extremities normal, atraumatic, no cyanosis or  edema Skin: Skin color, texture, turgor normal. No rashes or lesions Lymph nodes: Cervical, supraclavicular, and axillary nodes normal. No abnormal inguinal nodes palpated Neurologic: Grossly normal  Pelvic: External genitalia:  no lesions              Urethra:  normal appearing urethra with no masses, tenderness or lesions              Bartholins and Skenes: normal                 Vagina: normal appearing vagina with normal color and discharge, no lesions              Cervix: absent              Pap taken: No. Bimanual Exam:  Uterus:  uterus absent              Adnexa: no mass, fullness, tenderness               Rectovaginal: Confirms               Anus:  normal sphincter tone, no lesions  Chaperone was present for exam.  A:  Well woman with normal exam  S/p TLH/BSO, on HRT  H/O depression  H/O decreased libido, using topical testosterone  Vit D deficiency  P: Mammogram yearly pap smear not indicated due to h/o TLH, negative cervical pathology  Rx for minivelle 0.1mg  patches twice weekly to skin #24/4 RF.  Topical testosterone rx 1% cream, 1/2 ml daily. #60gm. Rx will be sent to [facility]. Pt will return (or do with Dr. Quay Burow) for a total testosterone level Return annually or prn

## 2016-06-01 ENCOUNTER — Encounter: Payer: Self-pay | Admitting: Obstetrics & Gynecology

## 2016-06-02 MED FILL — MINIVELLE 0.1 MG PATCH: 0.1 | 28 days supply | Qty: 8 | Fill #0

## 2016-06-05 MED FILL — PANTOPRAZOLE SOD DR 40 MG T: 40 | 90 days supply | Qty: 90 | Fill #2

## 2016-06-08 ENCOUNTER — Encounter: Payer: Self-pay | Admitting: Internal Medicine

## 2016-06-09 NOTE — Telephone Encounter (Signed)
Please advise 

## 2016-06-10 MED ORDER — DULOXETINE HCL 60 MG PO CPEP: 60.0000 mg | ORAL_CAPSULE | Freq: Every day | ORAL | 3 refills | Status: DC

## 2016-07-10 MED FILL — DULoxetine HCL 60 MG CPEP: 60 | 90 days supply | Qty: 90 | Fill #0

## 2016-07-10 MED FILL — MINIVELLE 0.1 MG PATCH: 0.1 | 28 days supply | Qty: 8 | Fill #1

## 2016-08-22 ENCOUNTER — Ambulatory Visit (INDEPENDENT_AMBULATORY_CARE_PROVIDER_SITE_OTHER): Payer: 59 | Admitting: Family

## 2016-08-22 ENCOUNTER — Encounter: Payer: Self-pay | Admitting: Family

## 2016-08-22 VITALS — BP 118/72 | HR 101 | Temp 98.8°F | Resp 16 | Ht 60.5 in | Wt 169.8 lb

## 2016-08-22 DIAGNOSIS — M79651 Pain in right thigh: Secondary | ICD-10-CM

## 2016-08-22 MED ORDER — METHYLPREDNISOLONE ACETATE 80 MG/ML IJ SUSP
80.0000 mg | Freq: Once | INTRAMUSCULAR | Status: AC
  Administered 2016-08-22: 80 mg via INTRAMUSCULAR

## 2016-08-22 MED ORDER — KETOROLAC TROMETHAMINE 60 MG/2ML IM SOLN
60.0000 mg | Freq: Once | INTRAMUSCULAR | Status: AC
  Administered 2016-08-22: 60 mg via INTRAMUSCULAR

## 2016-08-22 MED ORDER — NAPROXEN-ESOMEPRAZOLE 500-20 MG PO TBEC: 1.0000 | DELAYED_RELEASE_TABLET | Freq: Two times a day (BID) | ORAL | 0 refills | Status: DC | PRN

## 2016-08-22 NOTE — Assessment & Plan Note (Signed)
Right thigh pain concerning for possible rectus femoris /iliopsoas strain although no significant trauma. In office injection of 80 mg of Depo-Medrol and 60 mg of Toradol provided without complication. Treat conservatively with ice, compression, and mild stretching. Start Vimovo. Follow up if symptoms worsen or do not improve. Consider imaging and physical therapy as appropriate.

## 2016-08-22 NOTE — Progress Notes (Signed)
Subjective:    Patient ID: Sarah Mullen, female    DOB: 03-02-1956, 61 y.o.   MRN: [id number]  Chief Complaint  Patient presents with  . Leg Pain    right side pain, if she goes to a certain position she has a sharp pain in thigh, has had issues with back and hip for a while    HPI:  Sarah Mullen is a 61 y.o. female who  has a past medical history of Allergic rhinitis, cause unspecified; Blood transfusion; Chronic headaches; Depression; Diverticulitis; GERD (gastroesophageal reflux disease); Heart murmur; History of sinus tachycardia; Menometrorrhagia (11/21/2010); and Migraines. and presents today for an office visit.  This is a new problem. Associated symptom of pain located in her right thigh/groin and described as sharp has been going on for 3-4 days . Modifying factors include ibuprofen which has not helped very much. Severity is enough to effect her ability to sleep and work. Aggravating by movement and improved when sitting still. No trauma or injury that she can recall. Does have a significant history of back and hip pain. No numbness or tingling. 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   Allergies  Allergen Reactions  . Food Itching    Kiwi-throat itching  . Contrast Media [Iodinated Diagnostic Agents] Rash  . Keflex [Cephalexin] Rash      Outpatient Medications Prior to Visit  Medication Sig Dispense Refill  . aspirin EC 81 MG tablet Take 81 mg by mouth daily.    . cetirizine (ZYRTEC) 10 MG tablet Take 10 mg by mouth daily as needed.     . DULoxetine (CYMBALTA) 60 MG capsule Take 1 capsule (60 mg total) by mouth daily. 90 capsule 3  . MINIVELLE 0.1 MG/24HR patch Place 1 patch (0.1 mg total) onto the skin 2 (two) times a week. 8 patch 13  . NONFORMULARY OR COMPOUNDED ITEM Testosterone HRT (3ML) 1% cream, Apply 1/2 ml topically every morning as directed.  #60 grams 1 each 5  . Vitamin D, Ergocalciferol, (DRISDOL) 50000 units CAPS capsule  Take 1 capsule (50,000 Units total) by mouth every 7 (seven) days. 8 capsule 0   No facility-administered medications prior to visit.       Past Surgical History:  Procedure Laterality Date  . ABDOMINAL HYSTERECTOMY  09/2011  . BREAST BIOPSY    . CERCLAGE REMOVAL    . CESAREAN SECTION    . CYSTOSCOPY  10/17/2011   Procedure: CYSTOSCOPY;  Surgeon: Megan Salon, MD;  Location: [facility];  Service: Gynecology;;  . DILATION AND CURETTAGE OF UTERUS    . TONSILLECTOMY        Past Medical History:  Diagnosis Date  . Allergic rhinitis, cause unspecified   . Blood transfusion    iron transfusion- [place]-2009  . Chronic headaches   . Depression   . Diverticulitis   . GERD (gastroesophageal reflux disease)   . Heart murmur    diag with echo-physiologic murmur  . History of sinus tachycardia    copy of holter monitor results on chart  . Menometrorrhagia 11/21/2010  . Migraines       Review of Systems  Constitutional: Negative for chills and fever.  Musculoskeletal:       Positive for right thigh pain.   Neurological: Negative for weakness and numbness.      Objective:    BP 118/72 (BP Location: Left Arm, Patient Position: Sitting, Cuff Size: Normal)  Pulse (!) 101   Temp 98.8 F (37.1 C) (Oral)   Resp 16   Ht 5' 0.5" (1.537 m)   Wt 169 lb 12.8 oz (77 kg)   LMP 09/24/2011   SpO2 96%   BMI 32.62 kg/m  Nursing note and vital signs reviewed.  Physical Exam  Constitutional: She is oriented to person, place, and time. She appears well-developed and well-nourished. No distress.  Cardiovascular: Normal rate, regular rhythm, normal heart sounds and intact distal pulses.   Pulmonary/Chest: Effort normal and breath sounds normal.  Musculoskeletal:  Right thigh - no obvious deformity, discoloration, or edema. Mild tenderness over proximal rectus femoris and iliopsoas. No crepitus or deformity. Range of motion passively is normal. Range of motion actively is limited  secondary to pain with hip flexion and knee extension. Distal pulses and sensation are intact and appropriate.  Neurological: She is alert and oriented to person, place, and time.  Skin: Skin is warm and dry.  Psychiatric: She has a normal mood and affect. Her behavior is normal. Judgment and thought content normal.       Assessment & Plan:   Problem List Items Addressed This Visit      Other   Right thigh pain - Primary    Right thigh pain concerning for possible rectus femoris /iliopsoas strain although no significant trauma. In office injection of 80 mg of Depo-Medrol and 60 mg of Toradol provided without complication. Treat conservatively with ice, compression, and mild stretching. Start Vimovo. Follow up if symptoms worsen or do not improve. Consider imaging and physical therapy as appropriate.       Relevant Medications   ketorolac (TORADOL) injection 60 mg (Completed)   methylPREDNISolone acetate (DEPO-MEDROL) injection 80 mg (Completed)   Naproxen-Esomeprazole 500-20 MG TBEC       I have discontinued Ms. Lubrano's Vitamin D (Ergocalciferol). I am also having her start on Naproxen-Esomeprazole. Additionally, I am having her maintain her cetirizine, aspirin EC, MINIVELLE, NONFORMULARY OR COMPOUNDED ITEM, DULoxetine, pantoprazole, and Vitamin D. We administered ketorolac and methylPREDNISolone acetate.   Meds ordered this encounter  Medications  . pantoprazole (PROTONIX) 40 MG tablet    Sig: Take 40 mg by mouth daily.  . Cholecalciferol (VITAMIN D) 2000 units CAPS    Sig: Take by mouth.  Marland Kitchen ketorolac (TORADOL) injection 60 mg  . methylPREDNISolone acetate (DEPO-MEDROL) injection 80 mg  . Naproxen-Esomeprazole 500-20 MG TBEC    Sig: Take 1 tablet by mouth 2 (two) times daily as needed.    Dispense:  60 tablet    Refill:  0    Order Specific Question:   Supervising Provider    Answer:   Pricilla Holm A [id number]     Follow-up: Return in about 3 weeks (around 09/12/2016),  or if symptoms worsen or fail to improve.  Mauricio Po, FNP

## 2016-08-22 NOTE — Patient Instructions (Addendum)
Thank you for choosing [facility].  SUMMARY AND INSTRUCTIONS:  Ice x 20 minutes every 2 hours as needed and following activity.  Vimovo - 1 tablet by mouth 2x per day.  Stretches and exercises with sharp pain as the guideline.  Compression wrap / Thigh sleeve as needed.   Medication:  Your prescription(s) have been submitted to your pharmacy or been printed and provided for you. Please take as directed and contact our office if you believe you are having problem(s) with the medication(s) or have any questions.   Follow up:  If your symptoms worsen or fail to improve, please contact our office for further instruction, or in case of emergency go directly to the emergency room at the closest medical facility.    Quadriceps Strain Rehab Ask your health care provider which exercises are safe for you. Do exercises exactly as told by your health care provider and adjust them as directed. It is normal to feel mild stretching, pulling, tightness, or discomfort as you do these exercises, but you should stop right away if you feel sudden pain or your pain gets worse.Do not begin these exercises until told by your health care provider. Stretching and range of motion exercises These exercises warm up your muscles and joints and improve the movement and flexibility of your thigh. These exercises can also help to relieve stiffness or swelling. Exercise A: Heel slides 1. Lie on your back with both knees straight. If this causes back discomfort, bend the knee of your healthy leg, placing your foot flat on the floor. 2. Slowly slide your left / right heel back toward your buttocks until you feel a gentle stretch in the front of your knee or thigh. 3. Hold for __________ seconds. Then slowly slide your heel back to the starting position. Repeat __________ times. Complete this exercise __________ times a day. Exercise B: Quadriceps stretch, prone 1. Lie on your abdomen on a firm surface, such as  a bed or padded floor. 2. Bend your left / right knee and hold your ankle. If you cannot reach your ankle or pant leg, loop a belt around your foot and grab the belt instead. 3. Gently pull your heel toward your buttocks. Your knee should not slide out to the side. You should feel a stretch in the front of your thigh and knee. 4. Hold this position for __________ seconds. Repeat __________ times. Complete this exercise __________ times a day. Strengthening exercises These exercises build strength and endurance in your thigh. Endurance is the ability to use your muscles for a long time, even after your muscles get tired. Exercise C: Straight leg raises (quadriceps and hip flexors) Quality counts! Watch for signs that the quadriceps muscle is working to ensure that you are strengthening the correct muscles and not cheating by using healthier muscles. 1. Lie on your back with your left / right leg extended and your other knee bent. 2. Tense the muscles in the front of your left / right thigh. You should see your kneecap slide up or see increased dimpling just above the knee. 3. Tighten these muscles even more and raise your leg 4-6 inches (10-15 cm) off the floor. 4. Hold for __________ seconds. 5. Keep the thigh muscles tense as you lower your leg. 6. Relax the muscles slowly and completely after each repetition. Repeat __________ times. Complete this exercise __________ times a day. Exercise D: Straight leg raises (hip extensors) 1. Lie on your belly on a bed or a firm  surface with a pillow under your hips. 2. Bend your left / right knee so your foot is straight up in the air. 3. Tense your buttock muscles and lift your left / right thigh off the bed. Do not let your back arch. 4. Hold this position for __________ seconds. 5. Slowly return to the starting position. Let your muscles relax completely before doing another repetition. Repeat __________ times. Complete this exercise __________ times  a day. Exercise E: Wall sits Follow the directions for form closely. If you do not place your feet and knees properly, this can lead to knee pain. 1. Lean back against a smooth wall or door and walk your feet out 18-24 inches (46-61 cm) from it. Place your feet hip-width apart. 2. Slowly slide down the wall or door until your knees bend __________ degrees. Keep your weight back and over your heels, not over your toes. Keep your thighs straight or pointing slightly outward. 3. Hold for __________ seconds. 4. Use your thigh and buttock muscles to push you back up to a standing position. Keep your weight through your heels while you do this. 5. Rest for __________ seconds in between repetitions. Repeat __________ times. Complete this exercise __________ times a day. This information is not intended to replace advice given to you by your health care provider. Make sure you discuss any questions you have with your health care provider. Document Released: 07/10/2005 Document Revised: 03/16/2016 Document Reviewed: 04/13/2015 Elsevier Interactive Patient Education  2017 [employer].

## 2016-09-06 MED FILL — MINIVELLE 0.1 MG PATCH: 0.1 | 28 days supply | Qty: 8 | Fill #2

## 2016-10-24 MED FILL — MINIVELLE 0.1 MG PATCH: 0.1 | 28 days supply | Qty: 8 | Fill #3

## 2016-11-29 MED FILL — DULoxetine HCL 60 MG CPEP: 60 | 90 days supply | Qty: 90 | Fill #1

## 2016-11-29 MED FILL — MINIVELLE 0.1 MG PATCH: 0.1 | 28 days supply | Qty: 8 | Fill #4

## 2017-01-04 ENCOUNTER — Encounter: Payer: Self-pay | Admitting: Internal Medicine

## 2017-01-05 ENCOUNTER — Other Ambulatory Visit: Payer: Self-pay

## 2017-01-05 NOTE — Telephone Encounter (Signed)
Medication refill request: Testosterone HRT (3ML) 1% Cream Last AEX:  05/23/16 SM Next AEX: 09/14/17 SM Last MMG (if hormonal medication request): 05/17/16 BIRADS2, Density C, [facility] Refill authorized: 05/23/16 #1 each 5R. Please advise. Thank you.

## 2017-01-06 MED ORDER — PANTOPRAZOLE SODIUM 40 MG PO TBEC: 40.0000 mg | DELAYED_RELEASE_TABLET | Freq: Every day | ORAL | 1 refills | Status: DC

## 2017-01-15 MED ORDER — NONFORMULARY OR COMPOUNDED ITEM: 2 refills | Status: DC

## 2017-01-19 MED FILL — MINIVELLE 0.1 MG PATCH: 0.1 | 28 days supply | Qty: 8 | Fill #5

## 2017-01-19 MED FILL — PANTOPRAZOLE SOD DR 40 MG T: 40 | 90 days supply | Qty: 90 | Fill #0

## 2017-01-28 ENCOUNTER — Encounter: Payer: Self-pay | Admitting: Internal Medicine

## 2017-03-12 MED FILL — MINIVELLE 0.1 MG PATCH: 0.1 | 28 days supply | Qty: 8 | Fill #6

## 2017-03-12 MED FILL — DULoxetine HCL 60 MG CPEP: 60 | 90 days supply | Qty: 90 | Fill #2

## 2017-04-12 MED FILL — MINIVELLE 0.1 MG PATCH: 0.1 | 28 days supply | Qty: 8 | Fill #7

## 2017-04-25 ENCOUNTER — Encounter: Payer: Self-pay | Admitting: Internal Medicine

## 2017-05-16 ENCOUNTER — Encounter: Payer: 59 | Admitting: Internal Medicine

## 2017-06-01 ENCOUNTER — Other Ambulatory Visit: Payer: Self-pay | Admitting: Obstetrics & Gynecology

## 2017-06-01 MED FILL — DULoxetine HCL 60 MG CPEP: 60 | 90 days supply | Qty: 90 | Fill #3

## 2017-06-01 MED FILL — PANTOPRAZOLE SOD DR 40 MG T: 40 | 90 days supply | Qty: 90 | Fill #1

## 2017-06-01 NOTE — Telephone Encounter (Signed)
Medication refill request: MINIVELLE Last AEX:  05/23/16 SM Next AEX: 09/14/17 Last MMG (if hormonal medication request): Tried calling patient regarding MMG, no answer, left message to call me back Refill authorized: 05/25/16 #8 w/13 refills; today please advise

## 2017-06-04 MED FILL — MINIVELLE 0.1 MG PATCH: 0.1 | 28 days supply | Qty: 8 | Fill #0

## 2017-06-21 DIAGNOSIS — H52223 Regular astigmatism, bilateral: Secondary | ICD-10-CM | POA: Diagnosis not present

## 2017-06-21 DIAGNOSIS — H5203 Hypermetropia, bilateral: Secondary | ICD-10-CM | POA: Diagnosis not present

## 2017-07-26 NOTE — Progress Notes (Signed)
Subjective:    Patient ID: Sarah Mullen, female    DOB: 1955/11/09, 62 y.o.   MRN: [id number]  HPI She is here for a physical exam.     Medications and allergies reviewed with patient and updated if appropriate.  Patient Active Problem List   Diagnosis Date Noted  . Right thigh pain 08/22/2016  . Cervical radiculopathy 05/15/2016  . Vitamin D deficiency 05/15/2016  . Prediabetes 05/14/2016  . Fatty liver 03/09/2016  . Emesis 02/22/2016  . Positive TB test 05/28/2012  . GERD (gastroesophageal reflux disease) 11/21/2010  . Depression 11/21/2010  . Dyshidrotic eczema 11/21/2010    Current Outpatient Medications on File Prior to Visit  Medication Sig Dispense Refill  . aspirin EC 81 MG tablet Take 81 mg by mouth daily.    . cetirizine (ZYRTEC) 10 MG tablet Take 10 mg by mouth daily as needed.     . Cholecalciferol (VITAMIN D) 2000 units CAPS Take by mouth.    . DULoxetine (CYMBALTA) 60 MG capsule Take 1 capsule (60 mg total) by mouth daily. 90 capsule 3  . MINIVELLE 0.1 MG/24HR patch PLACE 1 PATCH ONTO THE SKIN 2 TIMES A WEEK. 8 patch 13  . Naproxen-Esomeprazole 500-20 MG TBEC Take 1 tablet by mouth 2 (two) times daily as needed. 60 tablet 0  . NONFORMULARY OR COMPOUNDED ITEM Testosterone HRT (3ML) 1% cream, Apply 1/2 ml topically every morning as directed.  #60 grams 1 each 2  . pantoprazole (PROTONIX) 40 MG tablet Take 1 tablet (40 mg total) by mouth daily. 90 tablet 1  . [DISCONTINUED] progesterone (PROMETRIUM) 200 MG capsule Take 200 mg by mouth daily.       No current facility-administered medications on file prior to visit.     Past Medical History:  Diagnosis Date  . Allergic rhinitis, cause unspecified   . Blood transfusion    iron transfusion- [place]-2009  . Chronic headaches   . Depression   . Diverticulitis   . GERD (gastroesophageal reflux disease)   . Heart murmur    diag with echo-physiologic murmur  . History of sinus tachycardia    copy of  holter monitor results on chart  . Menometrorrhagia 11/21/2010  . Migraines     Past Surgical History:  Procedure Laterality Date  . ABDOMINAL HYSTERECTOMY  09/2011  . BREAST BIOPSY    . CERCLAGE REMOVAL    . CESAREAN SECTION    . CYSTOSCOPY  10/17/2011   Procedure: CYSTOSCOPY;  Surgeon: Megan Salon, MD;  Location: [facility];  Service: Gynecology;;  . DILATION AND CURETTAGE OF UTERUS    . TONSILLECTOMY      Social History   Socioeconomic History  . Marital status: Single    Spouse name: Not on file  . Number of children: Not on file  . Years of education: Not on file  . Highest education level: Not on file  Social Needs  . Financial resource strain: Not on file  . Food insecurity - worry: Not on file  . Food insecurity - inability: Not on file  . Transportation needs - medical: Not on file  . Transportation needs - non-medical: Not on file  Occupational History  . Not on file  Tobacco Use  . Smoking status: Former Smoker    Types: Cigarettes    Last attempt to quit: 10/09/2001    Years since quitting: 15.8  . Smokeless tobacco: Never Used  Substance and Sexual Activity  . Alcohol use:  No    Alcohol/week: 0.0 oz  . Drug use: No  . Sexual activity: Yes    Partners: Male    Birth control/protection: Surgical    Comment: TLH/BSO  Other Topics Concern  . Not on file  Social History Narrative  . Not on file    Family History  Problem Relation Age of Onset  . Arthritis Mother   . Hyperlipidemia Mother   . Heart disease Mother   . Arthritis Father   . Lung cancer Father        smoker  . Heart disease Father   . Hypertension Father   . Diabetes Father   . Colon cancer Paternal Aunt   . CAD Paternal Grandmother   . Kidney disease Maternal Grandfather        ESRD on HD  . Kidney disease Maternal Grandmother        ESRD on HD    Review of Systems     Objective:  There were no vitals filed for this visit. There were no vitals filed for this visit. There is  no height or weight on file to calculate BMI.  Wt Readings from Last 3 Encounters:  08/22/16 169 lb 12.8 oz (77 kg)  05/23/16 166 lb 6.4 oz (75.5 kg)  05/15/16 169 lb (76.7 kg)     Physical Exam       Assessment & Plan:          This encounter was created in error - please disregard.

## 2017-07-27 ENCOUNTER — Encounter: Payer: 59 | Admitting: Internal Medicine

## 2017-08-09 ENCOUNTER — Encounter: Payer: Self-pay | Admitting: Obstetrics & Gynecology

## 2017-08-09 ENCOUNTER — Other Ambulatory Visit: Payer: Self-pay | Admitting: Obstetrics & Gynecology

## 2017-08-09 MED ORDER — ESTRADIOL 0.1 MG/24HR TD PTTW: 1.0000 | MEDICATED_PATCH | TRANSDERMAL | 1 refills | Status: DC

## 2017-08-09 MED FILL — ESTRADIOL 0.1 MG/24HR PTTW: 0.1 | 28 days supply | Qty: 8 | Fill #0

## 2017-08-21 NOTE — Patient Instructions (Addendum)
Test(s) ordered today. Your results will be released to [facility] (or called to you) after review, usually within 72hours after test completion. If any changes need to be made, you will be notified at that same time.  All other Health Maintenance issues reviewed.   All recommended immunizations and age-appropriate screenings are up-to-date or discussed.  No immunizations administered today.   Medications reviewed and updated.  No changes recommended at this time.  Your prescription(s) have been submitted to your pharmacy. Please take as directed and contact our office if you believe you are having problem(s) with the medication(s).  A referral was ordered for derm and neurosurgery  Please followup in one year   Health Maintenance, Female Adopting a healthy lifestyle and getting preventive care can go a long way to promote health and wellness. Talk with your health care provider about what schedule of regular examinations is right for you. This is a good chance for you to check in with your provider about disease prevention and staying healthy. In between checkups, there are plenty of things you can do on your own. Experts have done a lot of research about which lifestyle changes and preventive measures are most likely to keep you healthy. Ask your health care provider for more information. Weight and diet Eat a healthy diet  Be sure to include plenty of vegetables, fruits, low-fat dairy products, and lean protein.  Do not eat a lot of foods high in solid fats, added sugars, or salt.  Get regular exercise. This is one of the most important things you can do for your health. ? Most adults should exercise for at least 150 minutes each week. The exercise should increase your heart rate and make you sweat (moderate-intensity exercise). ? Most adults should also do strengthening exercises at least twice a week. This is in addition to the moderate-intensity exercise.  Maintain a healthy  weight  Body mass index (BMI) is a measurement that can be used to identify possible weight problems. It estimates body fat based on height and weight. Your health care provider can help determine your BMI and help you achieve or maintain a healthy weight.  For females 39 years of age and older: ? A BMI below 18.5 is considered underweight. ? A BMI of 18.5 to 24.9 is normal. ? A BMI of 25 to 29.9 is considered overweight. ? A BMI of 30 and above is considered obese.  Watch levels of cholesterol and blood lipids  You should start having your blood tested for lipids and cholesterol at 63 years of age, then have this test every 5 years.  You may need to have your cholesterol levels checked more often if: ? Your lipid or cholesterol levels are high. ? You are older than 63 years of age. ? You are at high risk for heart disease.  Cancer screening Lung Cancer  Lung cancer screening is recommended for adults 95-31 years old who are at high risk for lung cancer because of a history of smoking.  A yearly low-dose CT scan of the lungs is recommended for people who: ? Currently smoke. ? Have quit within the past 15 years. ? Have at least a 30-pack-year history of smoking. A pack year is smoking an average of one pack of cigarettes a day for 1 year.  Yearly screening should continue until it has been 15 years since you quit.  Yearly screening should stop if you develop a health problem that would prevent you from having lung cancer  treatment.  Breast Cancer  Practice breast self-awareness. This means understanding how your breasts normally appear and feel.  It also means doing regular breast self-exams. Let your health care provider know about any changes, no matter how small.  If you are in your 20s or 30s, you should have a clinical breast exam (CBE) by a health care provider every 1-3 years as part of a regular health exam.  If you are 23 or older, have a CBE every year. Also consider  having a breast X-ray (mammogram) every year.  If you have a family history of breast cancer, talk to your health care provider about genetic screening.  If you are at high risk for breast cancer, talk to your health care provider about having an MRI and a mammogram every year.  Breast cancer gene (BRCA) assessment is recommended for women who have family members with BRCA-related cancers. BRCA-related cancers include: ? Breast. ? Ovarian. ? Tubal. ? Peritoneal cancers.  Results of the assessment will determine the need for genetic counseling and BRCA1 and BRCA2 testing.  Cervical Cancer Your health care provider may recommend that you be screened regularly for cancer of the pelvic organs (ovaries, uterus, and vagina). This screening involves a pelvic examination, including checking for microscopic changes to the surface of your cervix (Pap test). You may be encouraged to have this screening done every 3 years, beginning at age 41.  For women ages 45-65, health care providers may recommend pelvic exams and Pap testing every 3 years, or they may recommend the Pap and pelvic exam, combined with testing for human papilloma virus (HPV), every 5 years. Some types of HPV increase your risk of cervical cancer. Testing for HPV may also be done on women of any age with unclear Pap test results.  Other health care providers may not recommend any screening for nonpregnant women who are considered low risk for pelvic cancer and who do not have symptoms. Ask your health care provider if a screening pelvic exam is right for you.  If you have had past treatment for cervical cancer or a condition that could lead to cancer, you need Pap tests and screening for cancer for at least 20 years after your treatment. If Pap tests have been discontinued, your risk factors (such as having a new sexual partner) need to be reassessed to determine if screening should resume. Some women have medical problems that increase  the chance of getting cervical cancer. In these cases, your health care provider may recommend more frequent screening and Pap tests.  Colorectal Cancer  This type of cancer can be detected and often prevented.  Routine colorectal cancer screening usually begins at 63 years of age and continues through 63 years of age.  Your health care provider may recommend screening at an earlier age if you have risk factors for colon cancer.  Your health care provider may also recommend using home test kits to check for hidden blood in the stool.  A small camera at the end of a tube can be used to examine your colon directly (sigmoidoscopy or colonoscopy). This is done to check for the earliest forms of colorectal cancer.  Routine screening usually begins at age 54.  Direct examination of the colon should be repeated every 5-10 years through 63 years of age. However, you may need to be screened more often if early forms of precancerous polyps or small growths are found.  Skin Cancer  Check your skin from head to toe regularly.  Tell your health care provider about any new moles or changes in moles, especially if there is a change in a mole's shape or color.  Also tell your health care provider if you have a mole that is larger than the size of a pencil eraser.  Always use sunscreen. Apply sunscreen liberally and repeatedly throughout the day.  Protect yourself by wearing long sleeves, pants, a wide-brimmed hat, and sunglasses whenever you are outside.  Heart disease, diabetes, and high blood pressure  High blood pressure causes heart disease and increases the risk of stroke. High blood pressure is more likely to develop in: ? People who have blood pressure in the high end of the normal range (130-139/85-89 mm Hg). ? People who are overweight or obese. ? People who are African American.  If you are 1-35 years of age, have your blood pressure checked every 3-5 years. If you are 59 years of age  or older, have your blood pressure checked every year. You should have your blood pressure measured twice-once when you are at a hospital or clinic, and once when you are not at a hospital or clinic. Record the average of the two measurements. To check your blood pressure when you are not at a hospital or clinic, you can use: ? An automated blood pressure machine at a pharmacy. ? A home blood pressure monitor.  If you are between 63 years and 20 years old, ask your health care provider if you should take aspirin to prevent strokes.  Have regular diabetes screenings. This involves taking a blood sample to check your fasting blood sugar level. ? If you are at a normal weight and have a low risk for diabetes, have this test once every three years after 63 years of age. ? If you are overweight and have a high risk for diabetes, consider being tested at a younger age or more often. Preventing infection Hepatitis B  If you have a higher risk for hepatitis B, you should be screened for this virus. You are considered at high risk for hepatitis B if: ? You were born in a country where hepatitis B is common. Ask your health care provider which countries are considered high risk. ? Your parents were born in a high-risk country, and you have not been immunized against hepatitis B (hepatitis B vaccine). ? You have HIV or AIDS. ? You use needles to inject street drugs. ? You live with someone who has hepatitis B. ? You have had sex with someone who has hepatitis B. ? You get hemodialysis treatment. ? You take certain medicines for conditions, including cancer, organ transplantation, and autoimmune conditions.  Hepatitis C  Blood testing is recommended for: ? Everyone born from [birth year] through 1966. ? Anyone with known risk factors for hepatitis C.  Sexually transmitted infections (STIs)  You should be screened for sexually transmitted infections (STIs) including gonorrhea and chlamydia if: ? You are  sexually active and are younger than 63 years of age. ? You are older than 63 years of age and your health care provider tells you that you are at risk for this type of infection. ? Your sexual activity has changed since you were last screened and you are at an increased risk for chlamydia or gonorrhea. Ask your health care provider if you are at risk.  If you do not have HIV, but are at risk, it may be recommended that you take a prescription medicine daily to prevent HIV infection. This is called  pre-exposure prophylaxis (PrEP). You are considered at risk if: ? You are sexually active and do not regularly use condoms or know the HIV status of your partner(s). ? You take drugs by injection. ? You are sexually active with a partner who has HIV.  Talk with your health care provider about whether you are at high risk of being infected with HIV. If you choose to begin PrEP, you should first be tested for HIV. You should then be tested every 3 months for as long as you are taking PrEP. Pregnancy  If you are premenopausal and you may become pregnant, ask your health care provider about preconception counseling.  If you may become pregnant, take 400 to 800 micrograms (mcg) of folic acid every day.  If you want to prevent pregnancy, talk to your health care provider about birth control (contraception). Osteoporosis and menopause  Osteoporosis is a disease in which the bones lose minerals and strength with aging. This can result in serious bone fractures. Your risk for osteoporosis can be identified using a bone density scan.  If you are 29 years of age or older, or if you are at risk for osteoporosis and fractures, ask your health care provider if you should be screened.  Ask your health care provider whether you should take a calcium or vitamin D supplement to lower your risk for osteoporosis.  Menopause may have certain physical symptoms and risks.  Hormone replacement therapy may reduce some  of these symptoms and risks. Talk to your health care provider about whether hormone replacement therapy is right for you. Follow these instructions at home:  Schedule regular health, dental, and eye exams.  Stay current with your immunizations.  Do not use any tobacco products including cigarettes, chewing tobacco, or electronic cigarettes.  If you are pregnant, do not drink alcohol.  If you are breastfeeding, limit how much and how often you drink alcohol.  Limit alcohol intake to no more than 1 drink per day for nonpregnant women. One drink equals 12 ounces of beer, 5 ounces of wine, or 1 ounces of hard liquor.  Do not use street drugs.  Do not share needles.  Ask your health care provider for help if you need support or information about quitting drugs.  Tell your health care provider if you often feel depressed.  Tell your health care provider if you have ever been abused or do not feel safe at home. This information is not intended to replace advice given to you by your health care provider. Make sure you discuss any questions you have with your health care provider. Document Released: 01/23/2011 Document Revised: 12/16/2015 Document Reviewed: 04/13/2015 Elsevier Interactive Patient Education  [employer].

## 2017-08-21 NOTE — Progress Notes (Signed)
Subjective:    Patient ID: Sarah Mullen, female    DOB: 05-May-1956, 62 y.o.   MRN: [id number]  HPI She is here for a physical exam.   Discoloring in ankles:  She is concerned about discoloration in her ankles.  She denies pain and swelling.  She has had swelling in the past but now wears compression hose when she works - she is a Marine scientist.    She is doing yoga.  She is working on eliminating sugar from her diet .  She is working on weight loss.    Medications and allergies reviewed with patient and updated if appropriate.  Patient Active Problem List   Diagnosis Date Noted  . Right thigh pain 08/22/2016  . Cervical radiculopathy 05/15/2016  . Vitamin D deficiency 05/15/2016  . Prediabetes 05/14/2016  . Fatty liver 03/09/2016  . Emesis 02/22/2016  . Positive TB test 05/28/2012  . GERD (gastroesophageal reflux disease) 11/21/2010  . Depression 11/21/2010  . Dyshidrotic eczema 11/21/2010    Current Outpatient Medications on File Prior to Visit  Medication Sig Dispense Refill  . aspirin EC 81 MG tablet Take 81 mg by mouth daily.    . cetirizine (ZYRTEC) 10 MG tablet Take 10 mg by mouth daily as needed.     . Cholecalciferol (VITAMIN D) 2000 units CAPS Take by mouth.    . DULoxetine (CYMBALTA) 60 MG capsule Take 1 capsule (60 mg total) by mouth daily. 90 capsule 3  . estradiol (MINIVELLE) 0.1 MG/24HR patch Place 1 patch (0.1 mg total) onto the skin 2 (two) times a week. 8 patch 1  . Naproxen-Esomeprazole 500-20 MG TBEC Take 1 tablet by mouth 2 (two) times daily as needed. 60 tablet 0  . NONFORMULARY OR COMPOUNDED ITEM Testosterone HRT (3ML) 1% cream, Apply 1/2 ml topically every morning as directed.  #60 grams 1 each 2  . pantoprazole (PROTONIX) 40 MG tablet Take 1 tablet (40 mg total) by mouth daily. 90 tablet 1  . [DISCONTINUED] progesterone (PROMETRIUM) 200 MG capsule Take 200 mg by mouth daily.       No current facility-administered medications on file prior to visit.      Past Medical History:  Diagnosis Date  . Allergic rhinitis, cause unspecified   . Blood transfusion    iron transfusion- [place]-2009  . Chronic headaches   . Depression   . Diverticulitis   . GERD (gastroesophageal reflux disease)   . Heart murmur    diag with echo-physiologic murmur  . History of sinus tachycardia    copy of holter monitor results on chart  . Menometrorrhagia 11/21/2010  . Migraines     Past Surgical History:  Procedure Laterality Date  . ABDOMINAL HYSTERECTOMY  09/2011  . BREAST BIOPSY    . CERCLAGE REMOVAL    . CESAREAN SECTION    . CYSTOSCOPY  10/17/2011   Procedure: CYSTOSCOPY;  Surgeon: Megan Salon, MD;  Location: [facility];  Service: Gynecology;;  . DILATION AND CURETTAGE OF UTERUS    . TONSILLECTOMY      Social History   Socioeconomic History  . Marital status: Single    Spouse name: None  . Number of children: None  . Years of education: None  . Highest education level: None  Social Needs  . Financial resource strain: None  . Food insecurity - worry: None  . Food insecurity - inability: None  . Transportation needs - medical: None  . Transportation needs - non-medical: None  Occupational History  . None  Tobacco Use  . Smoking status: Former Smoker    Types: Cigarettes    Last attempt to quit: 10/09/2001    Years since quitting: 15.8  . Smokeless tobacco: Never Used  Substance and Sexual Activity  . Alcohol use: No    Alcohol/week: 0.0 oz  . Drug use: No  . Sexual activity: Yes    Partners: Male    Birth control/protection: Surgical    Comment: TLH/BSO  Other Topics Concern  . None  Social History Narrative  . None    Family History  Problem Relation Age of Onset  . Arthritis Mother   . Hyperlipidemia Mother   . Heart disease Mother   . Arthritis Father   . Lung cancer Father        smoker  . Heart disease Father   . Hypertension Father   . Diabetes Father   . Colon cancer Paternal Aunt   . CAD Paternal  Grandmother   . Kidney disease Maternal Grandfather        ESRD on HD  . Kidney disease Maternal Grandmother        ESRD on HD    Review of Systems  Constitutional: Negative for chills and fever.  Eyes: Negative for visual disturbance.  Respiratory: Negative for cough, shortness of breath and wheezing.   Cardiovascular: Positive for chest pain (with sex, chronic). Negative for palpitations and leg swelling.  Gastrointestinal: Negative for abdominal pain, blood in stool, constipation, diarrhea and nausea.  Genitourinary: Negative for dysuria and hematuria.  Musculoskeletal: Positive for back pain and neck pain.  Skin: Negative for color change and rash.  Neurological: Positive for dizziness (occ), light-headedness (occ) and headaches.  Psychiatric/Behavioral: Negative for dysphoric mood. The patient is not nervous/anxious.        Objective:   Vitals:   08/22/17 0840  BP: 124/76  Pulse: 88  Resp: 16  Temp: 97.9 F (36.6 C)  SpO2: 96%   Filed Weights   08/22/17 0840  Weight: 173 lb (78.5 kg)   Body mass index is 32.69 kg/m.  Wt Readings from Last 3 Encounters:  08/22/17 173 lb (78.5 kg)  08/22/16 169 lb 12.8 oz (77 kg)  05/23/16 166 lb 6.4 oz (75.5 kg)     Physical Exam Constitutional: She appears well-developed and well-nourished. No distress.  HENT:  Head: Normocephalic and atraumatic.  Right Ear: External ear normal. Normal ear canal and TM Left Ear: External ear normal.  Normal ear canal and TM Mouth/Throat: Oropharynx is clear and moist.  Eyes: Conjunctivae and EOM are normal.  Neck: Neck supple. No tracheal deviation present. No thyromegaly present.  No carotid bruit  Cardiovascular: Normal rate, regular rhythm and normal heart sounds.   1/6 systolic murmur heard.  No edema. Pulmonary/Chest: Effort normal and breath sounds normal. No respiratory distress. She has no wheezes. She has no rales.  Breast: deferred to Gyn Abdominal: Soft. She exhibits no  distension. There is no tenderness.  Lymphadenopathy: She has no cervical adenopathy.  Skin: Skin is warm and dry. She is not diaphoretic. hyperpigmentation medial aspect of b/l ankles - no varicosities -likely related to prior swelling  Psychiatric: She has a normal mood and affect. Her behavior is normal.        Assessment & Plan:   Physical exam: Screening blood work  ordered Immunizations   Up to date, discussed shingrix  Colonoscopy   Up to date  Mammogram   Up to date  Gyn   Up to date  - Dr Sabra Heck Dexa    Up to date  Eye exams  Up to date  EKG  Last done 2013 Exercise  Doing yoga Weight   Working on weight loss Skin   Dark coloration in ankles, no other concerns.  Would like skin cancer screening - referred to derm Substance abuse   none  See Problem List for Assessment and Plan of chronic medical problems.   FU in one year

## 2017-08-22 ENCOUNTER — Ambulatory Visit (INDEPENDENT_AMBULATORY_CARE_PROVIDER_SITE_OTHER): Payer: No Typology Code available for payment source | Admitting: Internal Medicine

## 2017-08-22 ENCOUNTER — Encounter: Payer: Self-pay | Admitting: Internal Medicine

## 2017-08-22 ENCOUNTER — Other Ambulatory Visit (INDEPENDENT_AMBULATORY_CARE_PROVIDER_SITE_OTHER): Payer: No Typology Code available for payment source

## 2017-08-22 VITALS — BP 124/76 | HR 88 | Temp 97.9°F | Resp 16 | Ht 61.0 in | Wt 173.0 lb

## 2017-08-22 DIAGNOSIS — E7849 Other hyperlipidemia: Secondary | ICD-10-CM | POA: Diagnosis not present

## 2017-08-22 DIAGNOSIS — K219 Gastro-esophageal reflux disease without esophagitis: Secondary | ICD-10-CM | POA: Diagnosis not present

## 2017-08-22 DIAGNOSIS — F329 Major depressive disorder, single episode, unspecified: Secondary | ICD-10-CM

## 2017-08-22 DIAGNOSIS — Z0000 Encounter for general adult medical examination without abnormal findings: Secondary | ICD-10-CM

## 2017-08-22 DIAGNOSIS — R7303 Prediabetes: Secondary | ICD-10-CM | POA: Diagnosis not present

## 2017-08-22 DIAGNOSIS — E349 Endocrine disorder, unspecified: Secondary | ICD-10-CM | POA: Diagnosis not present

## 2017-08-22 DIAGNOSIS — M545 Low back pain, unspecified: Secondary | ICD-10-CM | POA: Insufficient documentation

## 2017-08-22 DIAGNOSIS — Z1283 Encounter for screening for malignant neoplasm of skin: Secondary | ICD-10-CM | POA: Diagnosis not present

## 2017-08-22 DIAGNOSIS — E559 Vitamin D deficiency, unspecified: Secondary | ICD-10-CM

## 2017-08-22 DIAGNOSIS — G8929 Other chronic pain: Secondary | ICD-10-CM | POA: Diagnosis not present

## 2017-08-22 DIAGNOSIS — F32A Depression, unspecified: Secondary | ICD-10-CM

## 2017-08-22 DIAGNOSIS — M5412 Radiculopathy, cervical region: Secondary | ICD-10-CM | POA: Diagnosis not present

## 2017-08-22 DIAGNOSIS — E785 Hyperlipidemia, unspecified: Secondary | ICD-10-CM | POA: Insufficient documentation

## 2017-08-22 LAB — CBC WITH DIFFERENTIAL/PLATELET
Basophils Absolute: 0.1 10*3/uL (ref 0.0–0.1)
Basophils Relative: 1 % (ref 0.0–3.0)
Eosinophils Absolute: 0.3 10*3/uL (ref 0.0–0.7)
Eosinophils Relative: 3.8 % (ref 0.0–5.0)
HCT: 42.7 % (ref 36.0–46.0)
Hemoglobin: 14.3 g/dL (ref 12.0–15.0)
Lymphocytes Relative: 26.8 % (ref 12.0–46.0)
Lymphs Abs: 1.9 10*3/uL (ref 0.7–4.0)
MCHC: 33.5 g/dL (ref 30.0–36.0)
MCV: 88.4 fl (ref 78.0–100.0)
Monocytes Absolute: 0.4 10*3/uL (ref 0.1–1.0)
Monocytes Relative: 6.2 % (ref 3.0–12.0)
Neutro Abs: 4.4 10*3/uL (ref 1.4–7.7)
Neutrophils Relative %: 62.2 % (ref 43.0–77.0)
Platelets: 293 10*3/uL (ref 150.0–400.0)
RBC: 4.83 Mil/uL (ref 3.87–5.11)
RDW: 14.1 % (ref 11.5–15.5)
WBC: 7.1 10*3/uL (ref 4.0–10.5)

## 2017-08-22 LAB — LIPID PANEL
Cholesterol: 177 mg/dL (ref 0–200)
HDL: 50.6 mg/dL
LDL Cholesterol: 112 mg/dL — ABNORMAL HIGH (ref 0–99)
NonHDL: 126.68
Total CHOL/HDL Ratio: 4
Triglycerides: 74 mg/dL (ref 0.0–149.0)
VLDL: 14.8 mg/dL (ref 0.0–40.0)

## 2017-08-22 LAB — COMPREHENSIVE METABOLIC PANEL WITH GFR
ALT: 42 U/L — ABNORMAL HIGH (ref 0–35)
AST: 26 U/L (ref 0–37)
Albumin: 4.4 g/dL (ref 3.5–5.2)
Alkaline Phosphatase: 79 U/L (ref 39–117)
BUN: 20 mg/dL (ref 6–23)
CO2: 27 meq/L (ref 19–32)
Calcium: 9.6 mg/dL (ref 8.4–10.5)
Chloride: 102 meq/L (ref 96–112)
Creatinine, Ser: 0.84 mg/dL (ref 0.40–1.20)
GFR: 72.98 mL/min
Glucose, Bld: 117 mg/dL — ABNORMAL HIGH (ref 70–99)
Potassium: 4.4 meq/L (ref 3.5–5.1)
Sodium: 139 meq/L (ref 135–145)
Total Bilirubin: 0.4 mg/dL (ref 0.2–1.2)
Total Protein: 7.3 g/dL (ref 6.0–8.3)

## 2017-08-22 LAB — VITAMIN D 25 HYDROXY (VIT D DEFICIENCY, FRACTURES): VITD: 27.15 ng/mL — ABNORMAL LOW (ref 30.00–100.00)

## 2017-08-22 LAB — HEMOGLOBIN A1C: Hgb A1c MFr Bld: 6.4 % (ref 4.6–6.5)

## 2017-08-22 LAB — TSH: TSH: 2.56 u[IU]/mL (ref 0.35–4.50)

## 2017-08-22 NOTE — Assessment & Plan Note (Signed)
Mildly elevated in the past Check lipid panel, CMP and TSH Working on improving diet Trying to exercise regularly and working on weight loss

## 2017-08-22 NOTE — Assessment & Plan Note (Signed)
Intermittent, chronic Following with neurosurgery-referred today so that she can have her routine follow-up for insurance purposes

## 2017-08-22 NOTE — Assessment & Plan Note (Signed)
On supplemental testosterone Will check level

## 2017-08-22 NOTE — Assessment & Plan Note (Signed)
Depression is controlled with Cymbalta.  Also takes Cymbalta for some of her chronic pain Medication well-tolerated Continue current dose

## 2017-08-22 NOTE — Assessment & Plan Note (Signed)
Check a1c Low sugar / carb diet Stressed regular exercise, weight loss  

## 2017-08-22 NOTE — Assessment & Plan Note (Signed)
GERD controlled Continue daily medication When she loses weight she will work on tapering off the protonix - recent attempts were not successful

## 2017-08-22 NOTE — Assessment & Plan Note (Signed)
Taking 2000 units daily Check level 

## 2017-08-22 NOTE — Assessment & Plan Note (Signed)
Following with neurosurgery Referred today for routine follow-up with neurosurgery-Dr. Saintclair Halsted

## 2017-08-23 LAB — TIQ-NTM

## 2017-08-23 LAB — TESTOSTERONE, FREE & TOTAL

## 2017-08-24 ENCOUNTER — Other Ambulatory Visit: Payer: No Typology Code available for payment source

## 2017-08-24 ENCOUNTER — Encounter: Payer: Self-pay | Admitting: Internal Medicine

## 2017-08-24 DIAGNOSIS — E349 Endocrine disorder, unspecified: Secondary | ICD-10-CM

## 2017-08-24 MED ORDER — CYCLOBENZAPRINE HCL 10 MG PO TABS: 5.0000 mg | ORAL_TABLET | Freq: Every day | ORAL | 0 refills | Status: DC

## 2017-08-24 MED FILL — CYCLOBENZAPRINE 10 MG TAB: 10 | 20 days supply | Qty: 20 | Fill #0

## 2017-08-25 ENCOUNTER — Encounter: Payer: Self-pay | Admitting: Internal Medicine

## 2017-08-25 DIAGNOSIS — E559 Vitamin D deficiency, unspecified: Secondary | ICD-10-CM

## 2017-08-25 DIAGNOSIS — R7303 Prediabetes: Secondary | ICD-10-CM

## 2017-09-06 ENCOUNTER — Other Ambulatory Visit: Payer: No Typology Code available for payment source

## 2017-09-06 DIAGNOSIS — E349 Endocrine disorder, unspecified: Secondary | ICD-10-CM

## 2017-09-08 ENCOUNTER — Encounter: Payer: Self-pay | Admitting: Internal Medicine

## 2017-09-08 LAB — TESTOSTERONE,FREE AND TOTAL
TESTOSTERONE: 15 ng/dL (ref 3–41)
Testosterone, Free: 0.6 pg/mL (ref 0.0–4.2)

## 2017-09-10 ENCOUNTER — Encounter: Payer: Self-pay | Admitting: Obstetrics & Gynecology

## 2017-09-14 ENCOUNTER — Ambulatory Visit (INDEPENDENT_AMBULATORY_CARE_PROVIDER_SITE_OTHER): Payer: No Typology Code available for payment source | Admitting: Obstetrics & Gynecology

## 2017-09-14 ENCOUNTER — Encounter: Payer: Self-pay | Admitting: Obstetrics & Gynecology

## 2017-09-14 ENCOUNTER — Other Ambulatory Visit: Payer: Self-pay

## 2017-09-14 VITALS — BP 136/70 | HR 94 | Resp 16 | Ht 60.5 in | Wt 172.0 lb

## 2017-09-14 DIAGNOSIS — Z01419 Encounter for gynecological examination (general) (routine) without abnormal findings: Secondary | ICD-10-CM | POA: Diagnosis not present

## 2017-09-14 MED ORDER — NONFORMULARY OR COMPOUNDED ITEM: 2 refills | Status: DC

## 2017-09-14 MED ORDER — ESTRADIOL 0.1 MG/24HR TD PTTW: 1.0000 | MEDICATED_PATCH | TRANSDERMAL | 3 refills | Status: DC

## 2017-09-14 MED FILL — ESTRADIOL 0.1 MG/24HR PTTW: 0.1 | 84 days supply | Qty: 24 | Fill #0

## 2017-09-14 NOTE — Progress Notes (Signed)
62 y.o. G7P1 SingleCaucasianF here for annual exam.    Patient's last menstrual period was 09/24/2011.          Sexually active: Yes.    The current method of family planning is status post hysterectomy.    Exercising: Yes.    yoga Smoker:  no  Health Maintenance: Pap:  2011 Normal  History of abnormal Pap:  Yes MMG:  09/06/17 BIRADS2:Benign  Colonoscopy:  2010 f/u 10 years  BMD:   02/05/15 normal  TDaP:  04/2016 Pneumonia vaccine(s):  No Shingrix:  On waiting list  Hep C testing: 05/28/15 Neg  Screening Labs: PCP   reports that she quit smoking about 15 years ago. Her smoking use included cigarettes. she has never used smokeless tobacco. She reports that she does not drink alcohol or use drugs.  Past Medical History:  Diagnosis Date  . Allergic rhinitis, cause unspecified   . Blood transfusion    iron transfusion- [place]-2009  . Chronic headaches   . Depression   . Diverticulitis   . GERD (gastroesophageal reflux disease)   . Heart murmur    diag with echo-physiologic murmur  . History of sinus tachycardia    copy of holter monitor results on chart  . Menometrorrhagia 11/21/2010  . Migraines   . Pre-diabetes     Past Surgical History:  Procedure Laterality Date  . ABDOMINAL HYSTERECTOMY  09/2011  . BREAST BIOPSY    . CERCLAGE REMOVAL    . CESAREAN SECTION    . CYSTOSCOPY  10/17/2011   Procedure: CYSTOSCOPY;  Surgeon: Megan Salon, MD;  Location: [facility];  Service: Gynecology;;  . DILATION AND CURETTAGE OF UTERUS    . TONSILLECTOMY      Current Outpatient Medications  Medication Sig Dispense Refill  . aspirin EC 81 MG tablet Take 81 mg by mouth daily.    . cetirizine (ZYRTEC) 10 MG tablet Take 10 mg by mouth daily as needed.     . Cholecalciferol (VITAMIN D) 2000 units CAPS Take 3,000 Units by mouth daily.     . cyclobenzaprine (FLEXERIL) 10 MG tablet Take 0.5-1 tablets (5-10 mg total) by mouth at bedtime. 20 tablet 0  . DULoxetine (CYMBALTA) 60 MG capsule  Take 1 capsule (60 mg total) by mouth daily. 90 capsule 3  . estradiol (MINIVELLE) 0.1 MG/24HR patch Place 1 patch (0.1 mg total) onto the skin 2 (two) times a week. 8 patch 1  . Naproxen-Esomeprazole 500-20 MG TBEC Take 1 tablet by mouth 2 (two) times daily as needed. 60 tablet 0  . NONFORMULARY OR COMPOUNDED ITEM Testosterone HRT (3ML) 1% cream, Apply 1/2 ml topically every morning as directed.  #60 grams 1 each 2  . pantoprazole (PROTONIX) 40 MG tablet Take 1 tablet (40 mg total) by mouth daily. 90 tablet 1   No current facility-administered medications for this visit.     Family History  Problem Relation Age of Onset  . Arthritis Mother   . Hyperlipidemia Mother   . Heart disease Mother   . Arthritis Father   . Lung cancer Father        smoker  . Heart disease Father   . Hypertension Father   . Diabetes Father   . Colon cancer Paternal Aunt   . CAD Paternal Grandmother   . Kidney disease Maternal Grandfather        ESRD on HD  . Kidney disease Maternal Grandmother        ESRD on HD  ROS:  Pertinent items are noted in HPI.  Otherwise, a comprehensive ROS was negative.  Exam:   BP 136/70 (BP Location: Right Arm, Patient Position: Sitting, Cuff Size: Normal)   Pulse 94   Resp 16   Ht 5' 0.5" (1.537 m)   Wt 172 lb (78 kg)   LMP 09/24/2011   BMI 33.04 kg/m   Weight change: @WEIGHTCHANGE @ Height:   Height: 5' 0.5" (153.7 cm)  Ht Readings from Last 3 Encounters:  09/14/17 5' 0.5" (1.537 m)  08/22/17 5\' 1"  (1.549 m)  08/22/16 5' 0.5" (1.537 m)    General appearance: alert, cooperative and appears stated age Head: Normocephalic, without obvious abnormality, atraumatic Neck: no adenopathy, supple, symmetrical, trachea midline and thyroid normal to inspection and palpation Lungs: clear to auscultation bilaterally Breasts: normal appearance, no masses or tenderness Heart: regular rate and rhythm Abdomen: soft, non-tender; bowel sounds normal; no masses,  no  organomegaly Extremities: extremities normal, atraumatic, no cyanosis or edema Skin: Skin color, texture, turgor normal. No rashes or lesions Lymph nodes: Cervical, supraclavicular, and axillary nodes normal. No abnormal inguinal nodes palpated Neurologic: Grossly normal   Pelvic: External genitalia:  no lesions              Urethra:  normal appearing urethra with no masses, tenderness or lesions              Bartholins and Skenes: normal                 Vagina: normal appearing vagina with normal color and discharge, no lesions              Cervix: absent              Pap taken: No. Bimanual Exam:  Uterus:  uterus absent              Adnexa: no mass, fullness, tenderness               Rectovaginal: Confirms               Anus:  normal sphincter tone, no lesions  Chaperone was present for exam.  A:  Well Woman with normal exam S/p TLH/BSO, on HRT H/o depression Decreased libido, using topical testosterone H/O low Vit D Fatty liver disease  P:   Mammogram guidelines reviewed.  Doing 3D pap smear not indicated RF for Minivelle 0.1mg  patches twice weekly.  #24/4RF RF for topica. testosterone 1% cream, 1/2 ml daily.  Rx will be done to [facility] Lab work UTD with Dr. Quay Burow On list for Shingrix with Dr. Quay Burow Return annually or prn

## 2017-09-18 ENCOUNTER — Encounter: Payer: Self-pay | Admitting: Internal Medicine

## 2017-09-20 ENCOUNTER — Encounter: Payer: Self-pay | Admitting: Internal Medicine

## 2017-10-05 ENCOUNTER — Other Ambulatory Visit: Payer: Self-pay | Admitting: Internal Medicine

## 2017-10-05 ENCOUNTER — Encounter: Payer: Self-pay | Admitting: Internal Medicine

## 2017-10-05 MED ORDER — DULOXETINE HCL 60 MG PO CPEP: 60.0000 mg | ORAL_CAPSULE | Freq: Every day | ORAL | 2 refills | Status: DC

## 2017-10-05 MED ORDER — PANTOPRAZOLE SODIUM 40 MG PO TBEC: 40.0000 mg | DELAYED_RELEASE_TABLET | Freq: Every day | ORAL | 2 refills | Status: DC

## 2017-10-05 MED FILL — DULoxetine HCL 60 MG CPEP: 60 | 90 days supply | Qty: 90 | Fill #0

## 2017-10-05 MED FILL — PANTOPRAZOLE SOD DR 40 MG T: 40 | 90 days supply | Qty: 90 | Fill #0

## 2017-11-08 ENCOUNTER — Telehealth: Payer: Self-pay

## 2017-11-08 NOTE — Telephone Encounter (Signed)
Left message asking patient to call back to schedule nurse visit to get first shingrix vaccine---if patient schedules, let tamara,RN at [facility] know so that both vaccines can be labeled

## 2017-11-15 ENCOUNTER — Other Ambulatory Visit: Payer: Self-pay | Admitting: Neurosurgery

## 2017-11-15 DIAGNOSIS — M542 Cervicalgia: Secondary | ICD-10-CM

## 2017-11-21 ENCOUNTER — Ambulatory Visit (HOSPITAL_COMMUNITY): Payer: No Typology Code available for payment source

## 2017-11-26 ENCOUNTER — Encounter: Payer: Self-pay | Admitting: Internal Medicine

## 2017-11-26 ENCOUNTER — Encounter: Payer: Self-pay | Admitting: Obstetrics & Gynecology

## 2017-11-26 ENCOUNTER — Telehealth: Payer: Self-pay | Admitting: Obstetrics & Gynecology

## 2017-11-26 NOTE — Telephone Encounter (Signed)
Patient sent the following correspondence through [facility]. Routing to triage to assist patient with request.  ----- Message from [place], Generic sent at 11/26/2017 1:44 PM EDT -----    If you have a minute could you send me the name of the doctor at [facility] that my mother should go to. the name of a women and female doctor. I'm not sure if she would prefer a women. She keeps asking me. Thank you. Have a great day.  Mickel Baas

## 2017-11-26 NOTE — Telephone Encounter (Signed)
I honestly do not know any of the providers there and I don't think her mother should go there because they will likely give her hormonal therapy and I do not think that is the correct thing for her.  I'm sorry.

## 2017-11-26 NOTE — Telephone Encounter (Signed)
Spoke with patient and she wants the name of a PCP at [facility] for her mom, Sarah Mullen. She would like a man and woman's name. Her mom keeps asking for this information. Will ask Dr.Miller and call her back.

## 2017-11-27 NOTE — Telephone Encounter (Signed)
Spoke with patient, advised as seen below per Dr. Sabra Heck. Patient verbalizes understanding and is agreeable. Will close encounter.

## 2017-11-27 NOTE — Telephone Encounter (Signed)
Left message to call Dhara Schepp at [phone].  

## 2017-11-27 NOTE — Telephone Encounter (Signed)
Patient returned call

## 2017-11-28 ENCOUNTER — Other Ambulatory Visit: Payer: Self-pay | Admitting: Neurosurgery

## 2017-11-28 DIAGNOSIS — M542 Cervicalgia: Secondary | ICD-10-CM

## 2017-12-04 ENCOUNTER — Ambulatory Visit: Payer: No Typology Code available for payment source

## 2017-12-19 ENCOUNTER — Ambulatory Visit (HOSPITAL_COMMUNITY)
Admission: RE | Admit: 2017-12-19 | Discharge: 2017-12-19 | Disposition: A | Discharge status: Home / Self Care | Payer: No Typology Code available for payment source | Source: Ambulatory Visit | Attending: Neurosurgery | Admitting: Neurosurgery

## 2017-12-19 DIAGNOSIS — M4802 Spinal stenosis, cervical region: Secondary | ICD-10-CM | POA: Diagnosis not present

## 2017-12-19 DIAGNOSIS — M542 Cervicalgia: Secondary | ICD-10-CM | POA: Diagnosis present

## 2018-01-07 MED FILL — DULoxetine HCL 60 MG CPEP: 60 | 90 days supply | Qty: 90 | Fill #1

## 2018-01-07 MED FILL — ESTRADIOL 0.1 MG/24HR PTTW: 0.1 | 84 days supply | Qty: 24 | Fill #1

## 2018-01-07 MED FILL — PANTOPRAZOLE SOD DR 40 MG T: 40 | 90 days supply | Qty: 90 | Fill #1

## 2018-01-08 ENCOUNTER — Encounter: Payer: Self-pay | Admitting: Internal Medicine

## 2018-01-21 ENCOUNTER — Encounter: Payer: Self-pay | Admitting: Internal Medicine

## 2018-01-21 DIAGNOSIS — G959 Disease of spinal cord, unspecified: Secondary | ICD-10-CM

## 2018-01-22 NOTE — Telephone Encounter (Signed)
Two calls were made to schedule appt for Derm in Feb. Letter mailed to pt, for pt to call when ready to schedule appt.

## 2018-01-25 ENCOUNTER — Encounter: Payer: Self-pay | Admitting: Internal Medicine

## 2018-01-25 DIAGNOSIS — Z1283 Encounter for screening for malignant neoplasm of skin: Secondary | ICD-10-CM

## 2018-01-28 NOTE — Telephone Encounter (Signed)
A new referral will need to be entered. Referral was cancelled due to not being able to contact pt.

## 2018-02-01 ENCOUNTER — Other Ambulatory Visit: Payer: Self-pay | Admitting: Neurosurgery

## 2018-03-14 ENCOUNTER — Other Ambulatory Visit: Payer: Self-pay | Admitting: *Deleted

## 2018-03-14 NOTE — Patient Outreach (Signed)
[facility]) Care Management  03/14/2018  Sarah Mullen 04/04/1956 [id number]   Subjective: Telephone call to patient's home  / mobile number, no answer, left HIPAA compliant voicemail message, and requested call back.    Objective:  Per KPN (Knowledge Performance Now, point of care tool) and chart review, patient to be admitted 03/27/18 for surgery.   Patient also has a history of prediabetes, vitamin d deficiency, and cervical radiculopathy.       Assessment:  Received UMR Preoperative / Transition of care referral on 03/13/18.   Preoperative call follow up pending patient contact.      Plan: RNCM will call patient for 2nd telephone outreach attempt, preoperative call follow up, within 10 business days if no return call.       Darshay Deupree H. Annia Friendly, BSN, [place] Management [facility] Telephonic CM Phone: [phone] Fax: [phone]

## 2018-03-15 NOTE — Pre-Procedure Instructions (Signed)
Sarah Mullen  03/15/2018      [place], [place] - [address] [facility]. [address] [place] [address] Phone: [phone] Fax: [place], [place] - [address] [place] [address] Phone: [phone] Fax: [phone]  [facility] [place], [place] [address] Phone: [phone] Fax: [phone]    Your procedure is scheduled on Wednesday September 4.  Report to [facility] Admitting at 6:30 A.M.  Call this number if you have problems the morning of surgery:  [phone]   Remember:  Do not eat or drink after midnight.    Take these medicines the morning of surgery with A SIP OF WATER:   Flexeril if needed Flonase if needed  7 days prior to surgery STOP taking any Aleve, Naproxen, Ibuprofen, Motrin, Advil, Goody's, BC's, all herbal medications, fish oil, and all vitamins  Follow your surgeon's instructions on stopping Aspirin.     Do not wear jewelry, make-up or nail polish.  Do not wear lotions, powders, or perfumes, or deodorant.  Do not shave 48 hours prior to surgery.  Men may shave face and neck.  Do not bring valuables to the hospital.  [facility] is not responsible for any belongings or valuables.  Contacts, dentures or bridgework may not be worn into surgery.  Leave your suitcase in the car.  After surgery it may be brought to your room.  For patients admitted to the hospital, discharge time will be determined by your treatment team.  Patients discharged the day of surgery will not be allowed to drive home.   Special instructions:    [facility]- Preparing For Surgery  Before surgery, you can play an important role. Because skin is not sterile, your skin needs to be as free of germs as possible. You can reduce the number of germs on your skin by washing with  CHG (chlorahexidine gluconate) Soap before surgery.  CHG is an antiseptic cleaner which kills germs and bonds with the skin to continue killing germs even after washing.    Oral Hygiene is also important to reduce your risk of infection.  Remember - BRUSH YOUR TEETH THE MORNING OF SURGERY WITH YOUR REGULAR TOOTHPASTE  Please do not use if you have an allergy to CHG or antibacterial soaps. If your skin becomes reddened/irritated stop using the CHG.  Do not shave (including legs and underarms) for at least 48 hours prior to first CHG shower. It is OK to shave your face.  Please follow these instructions carefully.   1. Shower the NIGHT BEFORE SURGERY and the MORNING OF SURGERY with CHG.   2. If you chose to wash your hair, wash your hair first as usual with your normal shampoo.  3. After you shampoo, rinse your hair and body thoroughly to remove the shampoo.  4. Use CHG as you would any other liquid soap. You can apply CHG directly to the skin and wash gently with a scrungie or a clean washcloth.   5. Apply the CHG Soap to your body ONLY FROM THE NECK DOWN.  Do not use on open wounds or open sores. Avoid contact with your eyes, ears, mouth and genitals (private parts). Wash Face and genitals (private parts)  with your normal soap.  6. Wash thoroughly, paying special attention to the area where your surgery will be  performed.  7. Thoroughly rinse your body with warm water from the neck down.  8. DO NOT shower/wash with your normal soap after using and rinsing off the CHG Soap.  9. Pat yourself dry with a CLEAN TOWEL.  10. Wear CLEAN PAJAMAS to bed the night before surgery, wear comfortable clothes the morning of surgery  11. Place CLEAN SHEETS on your bed the night of your first shower and DO NOT SLEEP WITH PETS.    Day of Surgery:  Do not apply any deodorants/lotions.  Please wear clean clothes to the hospital/surgery center.   Remember to brush your teeth WITH YOUR REGULAR  TOOTHPASTE.    Please read over the following fact sheets that you were given. Coughing and Deep Breathing, MRSA Information and Surgical Site Infection Prevention

## 2018-03-18 ENCOUNTER — Encounter: Payer: Self-pay | Admitting: *Deleted

## 2018-03-18 ENCOUNTER — Encounter (HOSPITAL_COMMUNITY)
Admission: RE | Admit: 2018-03-18 | Discharge: 2018-03-18 | Disposition: A | Discharge status: Home / Self Care | Payer: No Typology Code available for payment source | Source: Ambulatory Visit | Attending: Neurosurgery | Admitting: Neurosurgery

## 2018-03-18 ENCOUNTER — Encounter (HOSPITAL_COMMUNITY): Payer: Self-pay

## 2018-03-18 ENCOUNTER — Other Ambulatory Visit: Payer: Self-pay

## 2018-03-18 ENCOUNTER — Encounter: Payer: Self-pay | Admitting: Internal Medicine

## 2018-03-18 ENCOUNTER — Other Ambulatory Visit: Payer: Self-pay | Admitting: *Deleted

## 2018-03-18 DIAGNOSIS — Z7982 Long term (current) use of aspirin: Secondary | ICD-10-CM | POA: Insufficient documentation

## 2018-03-18 DIAGNOSIS — M4802 Spinal stenosis, cervical region: Secondary | ICD-10-CM | POA: Insufficient documentation

## 2018-03-18 DIAGNOSIS — E669 Obesity, unspecified: Secondary | ICD-10-CM | POA: Diagnosis not present

## 2018-03-18 DIAGNOSIS — Z01812 Encounter for preprocedural laboratory examination: Secondary | ICD-10-CM | POA: Diagnosis present

## 2018-03-18 DIAGNOSIS — Z7951 Long term (current) use of inhaled steroids: Secondary | ICD-10-CM | POA: Insufficient documentation

## 2018-03-18 DIAGNOSIS — Z87891 Personal history of nicotine dependence: Secondary | ICD-10-CM | POA: Diagnosis not present

## 2018-03-18 DIAGNOSIS — K219 Gastro-esophageal reflux disease without esophagitis: Secondary | ICD-10-CM | POA: Insufficient documentation

## 2018-03-18 DIAGNOSIS — Z01810 Encounter for preprocedural cardiovascular examination: Secondary | ICD-10-CM | POA: Diagnosis present

## 2018-03-18 DIAGNOSIS — Z79899 Other long term (current) drug therapy: Secondary | ICD-10-CM | POA: Diagnosis not present

## 2018-03-18 DIAGNOSIS — R7303 Prediabetes: Secondary | ICD-10-CM | POA: Diagnosis not present

## 2018-03-18 DIAGNOSIS — M5030 Other cervical disc degeneration, unspecified cervical region: Secondary | ICD-10-CM | POA: Insufficient documentation

## 2018-03-18 DIAGNOSIS — F329 Major depressive disorder, single episode, unspecified: Secondary | ICD-10-CM | POA: Diagnosis not present

## 2018-03-18 DIAGNOSIS — G43909 Migraine, unspecified, not intractable, without status migrainosus: Secondary | ICD-10-CM | POA: Insufficient documentation

## 2018-03-18 DIAGNOSIS — R011 Cardiac murmur, unspecified: Secondary | ICD-10-CM | POA: Insufficient documentation

## 2018-03-18 DIAGNOSIS — Z6833 Body mass index (BMI) 33.0-33.9, adult: Secondary | ICD-10-CM | POA: Diagnosis not present

## 2018-03-18 LAB — CBC
HEMATOCRIT: 45 % (ref 36.0–46.0)
HEMOGLOBIN: 14.1 g/dL (ref 12.0–15.0)
MCH: 29.4 pg (ref 26.0–34.0)
MCHC: 31.3 g/dL (ref 30.0–36.0)
MCV: 93.8 fL (ref 78.0–100.0)
Platelets: 261 10*3/uL (ref 150–400)
RBC: 4.8 MIL/uL (ref 3.87–5.11)
RDW: 13.2 % (ref 11.5–15.5)
WBC: 7.1 10*3/uL (ref 4.0–10.5)

## 2018-03-18 LAB — BASIC METABOLIC PANEL
ANION GAP: 8 (ref 5–15)
BUN: 18 mg/dL (ref 8–23)
CALCIUM: 9.3 mg/dL (ref 8.9–10.3)
CO2: 24 mmol/L (ref 22–32)
Chloride: 108 mmol/L (ref 98–111)
Creatinine, Ser: 0.82 mg/dL (ref 0.44–1.00)
GFR calc non Af Amer: >60 mL/min (ref >60)
Glucose, Bld: 125 mg/dL — ABNORMAL HIGH (ref 70–99)
Potassium: 4.2 mmol/L (ref 3.5–5.1)
SODIUM: 140 mmol/L (ref 135–145)

## 2018-03-18 LAB — TYPE AND SCREEN
ABO/RH(D): A POS
ANTIBODY SCREEN: NEGATIVE

## 2018-03-18 LAB — SURGICAL PCR SCREEN
MRSA, PCR: NEGATIVE
STAPHYLOCOCCUS AUREUS: NEGATIVE

## 2018-03-18 LAB — HEMOGLOBIN A1C
HEMOGLOBIN A1C: 5.9 % — AB (ref 4.8–5.6)
MEAN PLASMA GLUCOSE: 122.63 mg/dL

## 2018-03-18 LAB — ABO/RH: ABO/RH(D): A POS

## 2018-03-18 NOTE — Progress Notes (Signed)
PCP - Billey Gosling Cardiologist - denies  Chest x-ray - not needed EKG - 03/18/18 Stress Test - denies ECHO - 20-25 years Cardiac Cath - denies  Pre-diabetic doesn't check sugars   Aspirin Instructions: stop 5 days  Anesthesia review: yes, heart murmur  Patient denies shortness of breath, fever, cough and chest pain at PAT appointment   Patient verbalized understanding of instructions that were given to them at the PAT appointment. Patient was also instructed that they will need to review over the PAT instructions again at home before surgery.

## 2018-03-18 NOTE — Patient Outreach (Addendum)
[facility]) Care Management  03/18/2018  Sarah Mullen 1956/04/19 [id number]   Subjective: Telephone call to patient's home / mobile number, spoke with patient, and HIPAA verified.  Discussed [facility] Care Management UMR Transition of care follow up, preoperative call follow up, patient voiced understanding, and is in agreement to both types of follow up.   Patient states she is doing well, ready for surgery on 03/27/18 at [facility], and estimated length of stay 1 night. Discussed importance of hospital follow up with primary MD, patient voices understanding, and states she will follow up as appropriate.  Patient states she is able to manage self care and has assistance as needed with activities of daily living / home management pos hospital discharge.   Patient voices understanding of medical diagnosis, pending surgery, and treatment plan. States she is accessing the following [facility] benefits: outpatient pharmacy, hospital indemnity (not chosen benefit), and has family medical leave act Ecologist) in place. Discussed Advanced Directives, advised of [facility] Employee Spiritual Care Advanced Directives document completion benefit, patient voices understanding, and  declined to access benefit at this time.  States she will follow up with [facility] [phone]) and/ or [facility]  [phone]) regarding her short term disability and benefit payment questions.   Patient states she does not have any preoperative questions, care coordination, disease management, disease monitoring, transportation, community resource, or pharmacy needs at this time.   States she is very appreciative of the follow up and is in agreement to receive [place] Management information post transition of care follow up.     Objective:  Per KPN (Knowledge Performance Now, point of care tool) and chart review, patient to be admitted 03/27/18 for surgery.   Patient also has a history  of prediabetes, vitamin d deficiency, and cervical radiculopathy.       Assessment:  Received UMR Preoperative / Transition of care referral on 03/13/18.   Preoperative call completed, and transition of care follow up pending notification of patient discharge.     Plan: RNCM will call patient for  telephone outreach attempt, transition of care follow up, within 3 business days of hospital discharge notification.        Sarah Mullen, BSN, [place] Management [facility] Telephonic CM Phone: [phone] Fax: [phone]

## 2018-03-19 ENCOUNTER — Encounter (HOSPITAL_COMMUNITY): Payer: Self-pay

## 2018-03-19 NOTE — Progress Notes (Signed)
Anesthesia Chart Review:   Case:  [id number] Date/Time:  03/27/18 0815   Procedure:  ACDF - C3-C4 - C4-C5 - C5-C6 (N/A )   Anesthesia type:  General   Pre-op diagnosis:  Myelopathy   Location:  [facility] OR ROOM 21 / [facility]   Surgeon:  Kary Kos, MD      DISCUSSION: Patient is a 62 year old female scheduled for the above procedure. History includes former smoker (quit '03), GERD, migraines, murmur (trace MR/TR '08), sinus tachycardia (with occasional run atrial tachycardia '08 Holter monitor), pre-diabetes. BMI is consistent with obesity.   If no acute changes then I anticipate that she can proceed as planned.   VS: BP 129/80   Pulse 100   Temp 37 C   Resp 18   Ht 5\' 1"  (1.549 m)   Wt 81.1 kg   LMP 09/24/2011   SpO2 94%   BMI 33.77 kg/m     PROVIDERS: Binnie Rail, MD is PCP   LABS: Labs reviewed: Acceptable for surgery. (all labs ordered are listed, but only abnormal results are displayed)  Labs Reviewed  BASIC METABOLIC PANEL - Abnormal; Notable for the following components:      Result Value   Glucose, Bld 125 (*)    All other components within normal limits  HEMOGLOBIN A1C - Abnormal; Notable for the following components:   Hgb A1c MFr Bld 5.9 (*)    All other components within normal limits  SURGICAL PCR SCREEN  CBC  TYPE AND SCREEN  ABO/RH    IMAGES: MRI C-spine 12/19/17: IMPRESSION: 1. Chronic degenerative spinal stenosis with mild spinal cord mass effect at C4-C5 and C5-C6. Myelomalacia suspected within the left hemi-cord at the former, and within the bilateral cord at the latter. Chronic moderate to severe bilateral foraminal stenosis at both levels. 2. Progress C3-C4 degeneration since 2017 with mild spinal stenosis and cord mass effect. Moderate left and severe right C4 foraminal stenosis appears stable. 3. Progressed mild spinal stenosis at C6-C7 without cord mass effect. Moderate to severe bilateral C7 foraminal stenosis.   EKG: 03/18/18: NSR,  non-specific T wave abnormality   CV: Cardiology records from [facility] include (scanned under Media tab; Correspondence, 10/17/11): - Echo 07/04/07: Normal left ventricular size with well-preserved systolic function.  LVEF in the range of 70%.  Trace mitral regurgitation.  Trivial, early systolic mitral regurgitation.  Trace tricuspid regurgitation without evidence of pulmonary hypertension.  - 24 hour Holter monitor 07/04/07: Conclusions: Baseline sinus rhythm at an average rate of 95 bpm with rates ranging from 69 bpm during sleep to 154 bpm during stair climbing activity.  No prolonged pauses.  No ventricular ectopy.  Occasional supraventricular ectopy consisting primarily of single PACs but with 4 runs of probable ectopic atrial tachycardia lasting up to 20 seconds at a heart rate approximately 125 bpm.  Symptoms of palpitations, chest pressure or shortness of breath primarily correlated with sinus tachycardia.  Interestingly the brief atrial arrhythmia was not apparently associated with symptoms.  Occasional atrial arrhythmia with benign features.   Past Medical History:  Diagnosis Date  . Allergic rhinitis, cause unspecified   . Blood transfusion    iron transfusion- [place]-2009  . Chronic headaches   . Depression   . Diverticulitis   . GERD (gastroesophageal reflux disease)   . Heart murmur    diag with echo-physiologic murmur; trace MR, trivial, early systolic MR by 63/87/56 echo ([facility])  . History of  sinus tachycardia    07/04/07 Holter monitor: 69-154, average HR 95 bpm. Occ PACs, 4 runs probable atrial tachycardia  . Menometrorrhagia 11/21/2010  . Migraines   . Pre-diabetes     Past Surgical History:  Procedure Laterality Date  . ABDOMINAL HYSTERECTOMY  09/2011  . BREAST BIOPSY    . CERCLAGE REMOVAL     WHEN pregnant  . CESAREAN SECTION    . CYSTOSCOPY  10/17/2011   Procedure: CYSTOSCOPY;  Surgeon: Megan Salon, MD;   Location: [facility];  Service: Gynecology;;  . DILATION AND CURETTAGE OF UTERUS    . TONSILLECTOMY      MEDICATIONS: . aspirin EC 81 MG tablet  . aspirin-acetaminophen-caffeine (EXCEDRIN MIGRAINE) 250-250-65 MG tablet  . cholecalciferol (VITAMIN D) 1000 units tablet  . cyclobenzaprine (FLEXERIL) 10 MG tablet  . DULoxetine (CYMBALTA) 60 MG capsule  . estradiol (MINIVELLE) 0.1 MG/24HR patch  . fluticasone (FLONASE) 50 MCG/ACT nasal spray  . ibuprofen (ADVIL,MOTRIN) 200 MG tablet  . loratadine (CLARITIN) 10 MG tablet  . NONFORMULARY OR COMPOUNDED ITEM  . pantoprazole (PROTONIX) 40 MG tablet  . vitamin B-12 (CYANOCOBALAMIN) 500 MCG tablet  . vitamin C (ASCORBIC ACID) 500 MG tablet   No current facility-administered medications for this encounter.   She is to hold ASA 5 days prior to surgery.   George Hugh [facility] Short Stay Center/Anesthesiology Phone [phone] 03/19/2018 12:29 PM

## 2018-03-26 NOTE — Anesthesia Preprocedure Evaluation (Addendum)
Anesthesia Evaluation  Patient identified by MRN, date of birth, ID band Patient awake    Reviewed: Allergy & Precautions, NPO status , Patient's Chart, lab work & pertinent test results  Airway Mallampati: II  TM Distance: >3 FB Neck ROM: Full    Dental no notable dental hx. (+) Teeth Intact, Dental Advisory Given   Pulmonary former smoker,    Pulmonary exam normal breath sounds clear to auscultation       Cardiovascular Exercise Tolerance: Good Normal cardiovascular exam+ Valvular Problems/Murmurs  Rhythm:Regular Rate:Normal     Neuro/Psych  Headaches, PSYCHIATRIC DISORDERS Anxiety    GI/Hepatic GERD  Medicated and Controlled,  Endo/Other    Renal/GU      Musculoskeletal   Abdominal (+) + obese,   Peds  Hematology negative hematology ROS (+)   Anesthesia Other Findings Pre operatively. Pt w good motor strength bilaterally in UE, 5/5 grip strength on hands, 5/5 able to shrug shoulders and lift arms over head easily. C/o of a little numbness "all the time" in upper extremities.   Reproductive/Obstetrics negative OB ROS                           Lab Results  Component Value Date   WBC 7.1 03/18/2018   HGB 14.1 03/18/2018   HCT 45.0 03/18/2018   MCV 93.8 03/18/2018   PLT 261 03/18/2018    Anesthesia Physical Anesthesia Plan  ASA: III  Anesthesia Plan: General   Post-op Pain Management:    Induction: Intravenous  PONV Risk Score and Plan: Treatment may vary due to age or medical condition, Ondansetron and Dexamethasone  Airway Management Planned: Video Laryngoscope Planned and Oral ETT  Additional Equipment:   Intra-op Plan:   Post-operative Plan: Extubation in OR  Informed Consent: I have reviewed the patients History and Physical, chart, labs and discussed the procedure including the risks, benefits and alternatives for the proposed anesthesia with the patient or authorized  representative who has indicated his/her understanding and acceptance.   Dental advisory given  Plan Discussed with: CRNA  Anesthesia Plan Comments:         Anesthesia Quick Evaluation

## 2018-03-27 ENCOUNTER — Inpatient Hospital Stay (HOSPITAL_COMMUNITY)
Admission: RE | Disposition: A | Discharge status: Home / Self Care | Payer: Self-pay | Source: Ambulatory Visit | Attending: Neurosurgery

## 2018-03-27 ENCOUNTER — Inpatient Hospital Stay (HOSPITAL_COMMUNITY): Payer: No Typology Code available for payment source | Admitting: Vascular Surgery

## 2018-03-27 ENCOUNTER — Inpatient Hospital Stay (HOSPITAL_COMMUNITY): Payer: No Typology Code available for payment source

## 2018-03-27 ENCOUNTER — Inpatient Hospital Stay (HOSPITAL_COMMUNITY)
Admission: RE | Admit: 2018-03-27 | Discharge: 2018-03-28 | DRG: 472 | Disposition: A | Discharge status: Home / Self Care | Payer: No Typology Code available for payment source | Attending: Neurosurgery | Admitting: Neurosurgery

## 2018-03-27 ENCOUNTER — Encounter (HOSPITAL_COMMUNITY): Payer: Self-pay | Admitting: Certified Registered Nurse Anesthetist

## 2018-03-27 ENCOUNTER — Inpatient Hospital Stay (HOSPITAL_COMMUNITY): Payer: No Typology Code available for payment source | Admitting: Anesthesiology

## 2018-03-27 DIAGNOSIS — Z87891 Personal history of nicotine dependence: Secondary | ICD-10-CM | POA: Diagnosis not present

## 2018-03-27 DIAGNOSIS — M4802 Spinal stenosis, cervical region: Secondary | ICD-10-CM | POA: Diagnosis present

## 2018-03-27 DIAGNOSIS — Z7982 Long term (current) use of aspirin: Secondary | ICD-10-CM

## 2018-03-27 DIAGNOSIS — E669 Obesity, unspecified: Secondary | ICD-10-CM | POA: Diagnosis present

## 2018-03-27 DIAGNOSIS — M4712 Other spondylosis with myelopathy, cervical region: Secondary | ICD-10-CM | POA: Diagnosis present

## 2018-03-27 DIAGNOSIS — K219 Gastro-esophageal reflux disease without esophagitis: Secondary | ICD-10-CM | POA: Diagnosis present

## 2018-03-27 DIAGNOSIS — M542 Cervicalgia: Secondary | ICD-10-CM | POA: Diagnosis present

## 2018-03-27 DIAGNOSIS — Z7951 Long term (current) use of inhaled steroids: Secondary | ICD-10-CM | POA: Diagnosis not present

## 2018-03-27 DIAGNOSIS — Z79899 Other long term (current) drug therapy: Secondary | ICD-10-CM | POA: Diagnosis not present

## 2018-03-27 DIAGNOSIS — Z419 Encounter for procedure for purposes other than remedying health state, unspecified: Secondary | ICD-10-CM

## 2018-03-27 DIAGNOSIS — Z90710 Acquired absence of both cervix and uterus: Secondary | ICD-10-CM | POA: Diagnosis not present

## 2018-03-27 DIAGNOSIS — Z6833 Body mass index (BMI) 33.0-33.9, adult: Secondary | ICD-10-CM | POA: Diagnosis not present

## 2018-03-27 HISTORY — PX: ANTERIOR CERVICAL DECOMP/DISCECTOMY FUSION: SHX1161

## 2018-03-27 LAB — GLUCOSE, CAPILLARY: Glucose-Capillary: 125 mg/dL — ABNORMAL HIGH (ref 70–99)

## 2018-03-27 SURGERY — ANTERIOR CERVICAL DECOMPRESSION/DISCECTOMY FUSION 3 LEVELS
Anesthesia: General | Site: Spine Cervical

## 2018-03-27 MED ORDER — CYCLOBENZAPRINE HCL 10 MG PO TABS: 10.0000 mg | ORAL_TABLET | Freq: Every day | ORAL | Status: DC | PRN

## 2018-03-27 MED ORDER — LACTATED RINGERS IV SOLN
INTRAVENOUS | Status: DC
  Administered 2018-03-27 (×2): via INTRAVENOUS

## 2018-03-27 MED ORDER — SODIUM CHLORIDE 0.9% FLUSH: 3.0000 mL | INTRAVENOUS | Status: DC | PRN

## 2018-03-27 MED ORDER — PHENOL 1.4 % MT LIQD: 1.0000 | OROMUCOSAL | Status: DC | PRN

## 2018-03-27 MED ORDER — DEXAMETHASONE SODIUM PHOSPHATE 10 MG/ML IJ SOLN
INTRAMUSCULAR | Status: AC
  Filled 2018-03-27: qty 1

## 2018-03-27 MED ORDER — VANCOMYCIN HCL IN DEXTROSE 1-5 GM/200ML-% IV SOLN
1000.0000 mg | Freq: Two times a day (BID) | INTRAVENOUS | Status: DC
  Administered 2018-03-27 – 2018-03-28 (×2): 1000 mg via INTRAVENOUS
  Filled 2018-03-27 (×2): qty 200

## 2018-03-27 MED ORDER — DULOXETINE HCL 30 MG PO CPEP
60.0000 mg | ORAL_CAPSULE | Freq: Every day | ORAL | Status: DC
  Administered 2018-03-27: 60 mg via ORAL
  Filled 2018-03-27: qty 2

## 2018-03-27 MED ORDER — LORATADINE 10 MG PO TABS
10.0000 mg | ORAL_TABLET | Freq: Every day | ORAL | Status: DC
  Administered 2018-03-27: 10 mg via ORAL
  Filled 2018-03-27: qty 1

## 2018-03-27 MED ORDER — CEFAZOLIN SODIUM-DEXTROSE 2-4 GM/100ML-% IV SOLN: 2.0000 g | Freq: Three times a day (TID) | INTRAVENOUS | Status: DC

## 2018-03-27 MED ORDER — ASPIRIN-ACETAMINOPHEN-CAFFEINE 250-250-65 MG PO TABS
2.0000 | ORAL_TABLET | Freq: Every day | ORAL | Status: DC | PRN
  Filled 2018-03-27: qty 2

## 2018-03-27 MED ORDER — FENTANYL CITRATE (PF) 250 MCG/5ML IJ SOLN
INTRAMUSCULAR | Status: AC
  Filled 2018-03-27: qty 5

## 2018-03-27 MED ORDER — ACETAMINOPHEN 650 MG RE SUPP: 650.0000 mg | RECTAL | Status: DC | PRN

## 2018-03-27 MED ORDER — ACETAMINOPHEN 10 MG/ML IV SOLN: 1000.0000 mg | Freq: Once | INTRAVENOUS | Status: DC | PRN

## 2018-03-27 MED ORDER — VANCOMYCIN HCL IN DEXTROSE 1-5 GM/200ML-% IV SOLN
1000.0000 mg | INTRAVENOUS | Status: AC
  Administered 2018-03-27 (×2): 1000 mg via INTRAVENOUS
  Filled 2018-03-27: qty 200

## 2018-03-27 MED ORDER — LIDOCAINE 2% (20 MG/ML) 5 ML SYRINGE
INTRAMUSCULAR | Status: AC
  Filled 2018-03-27: qty 5

## 2018-03-27 MED ORDER — PROMETHAZINE HCL 25 MG/ML IJ SOLN: 6.2500 mg | INTRAMUSCULAR | Status: DC | PRN

## 2018-03-27 MED ORDER — NEOSTIGMINE METHYLSULFATE 3 MG/3ML IV SOSY
PREFILLED_SYRINGE | INTRAVENOUS | Status: AC
  Filled 2018-03-27: qty 3

## 2018-03-27 MED ORDER — MEPERIDINE HCL 50 MG/ML IJ SOLN: 6.2500 mg | INTRAMUSCULAR | Status: DC | PRN

## 2018-03-27 MED ORDER — VITAMIN D 1000 UNITS PO TABS: 3000.0000 [IU] | ORAL_TABLET | Freq: Every day | ORAL | Status: DC

## 2018-03-27 MED ORDER — THROMBIN 5000 UNITS EX SOLR
CUTANEOUS | Status: AC
  Filled 2018-03-27: qty 5000

## 2018-03-27 MED ORDER — PROPOFOL 10 MG/ML IV BOLUS
INTRAVENOUS | Status: DC | PRN
  Administered 2018-03-27: 20 mg via INTRAVENOUS
  Administered 2018-03-27: 10 mg via INTRAVENOUS
  Administered 2018-03-27: 150 mg via INTRAVENOUS
  Administered 2018-03-27: 10 mg via INTRAVENOUS

## 2018-03-27 MED ORDER — ONDANSETRON HCL 4 MG/2ML IJ SOLN: 4.0000 mg | Freq: Four times a day (QID) | INTRAMUSCULAR | Status: DC | PRN

## 2018-03-27 MED ORDER — DEXMEDETOMIDINE HCL 200 MCG/2ML IV SOLN
INTRAVENOUS | Status: DC | PRN
  Administered 2018-03-27 (×5): 8 ug via INTRAVENOUS
  Administered 2018-03-27: 40 ug via INTRAVENOUS

## 2018-03-27 MED ORDER — VITAMIN C 500 MG PO TABS: 500.0000 mg | ORAL_TABLET | Freq: Every day | ORAL | Status: DC

## 2018-03-27 MED ORDER — PROPOFOL 10 MG/ML IV BOLUS
INTRAVENOUS | Status: AC
  Filled 2018-03-27: qty 40

## 2018-03-27 MED ORDER — HYDROMORPHONE HCL 1 MG/ML IJ SOLN: 0.5000 mg | INTRAMUSCULAR | Status: DC | PRN

## 2018-03-27 MED ORDER — MIDAZOLAM HCL 2 MG/2ML IJ SOLN
INTRAMUSCULAR | Status: AC
  Filled 2018-03-27: qty 2

## 2018-03-27 MED ORDER — CHLORHEXIDINE GLUCONATE CLOTH 2 % EX PADS: 6.0000 | MEDICATED_PAD | Freq: Once | CUTANEOUS | Status: DC

## 2018-03-27 MED ORDER — HYDROCODONE-ACETAMINOPHEN 7.5-325 MG PO TABS: 1.0000 | ORAL_TABLET | Freq: Once | ORAL | Status: DC | PRN

## 2018-03-27 MED ORDER — ALBUMIN HUMAN 5 % IV SOLN
INTRAVENOUS | Status: DC | PRN
  Administered 2018-03-27: 11:00:00 via INTRAVENOUS

## 2018-03-27 MED ORDER — THROMBIN 5000 UNITS EX SOLR
OROMUCOSAL | Status: DC | PRN
  Administered 2018-03-27 (×2)

## 2018-03-27 MED ORDER — ALUM & MAG HYDROXIDE-SIMETH 200-200-20 MG/5ML PO SUSP: 30.0000 mL | Freq: Four times a day (QID) | ORAL | Status: DC | PRN

## 2018-03-27 MED ORDER — ONDANSETRON HCL 4 MG/2ML IJ SOLN
INTRAMUSCULAR | Status: AC
  Filled 2018-03-27: qty 2

## 2018-03-27 MED ORDER — DEXAMETHASONE SODIUM PHOSPHATE 10 MG/ML IJ SOLN
10.0000 mg | INTRAMUSCULAR | Status: AC
  Administered 2018-03-27: 10 mg via INTRAVENOUS
  Filled 2018-03-27: qty 1

## 2018-03-27 MED ORDER — CYCLOBENZAPRINE HCL 10 MG PO TABS
10.0000 mg | ORAL_TABLET | Freq: Three times a day (TID) | ORAL | Status: DC | PRN
  Administered 2018-03-27: 10 mg via ORAL
  Filled 2018-03-27: qty 1

## 2018-03-27 MED ORDER — HYDROCODONE-ACETAMINOPHEN 5-325 MG PO TABS
1.0000 | ORAL_TABLET | ORAL | Status: DC | PRN
  Administered 2018-03-27 (×2): 2 via ORAL
  Administered 2018-03-28 (×2): 1 via ORAL
  Filled 2018-03-27: qty 2
  Filled 2018-03-27: qty 1
  Filled 2018-03-27: qty 2
  Filled 2018-03-27: qty 1

## 2018-03-27 MED ORDER — ONDANSETRON HCL 4 MG/2ML IJ SOLN
INTRAMUSCULAR | Status: DC | PRN
  Administered 2018-03-27: 4 mg via INTRAVENOUS

## 2018-03-27 MED ORDER — THROMBIN 20000 UNITS EX SOLR
CUTANEOUS | Status: DC | PRN
  Administered 2018-03-27: 10:00:00

## 2018-03-27 MED ORDER — SODIUM CHLORIDE 0.9 % IV SOLN
INTRAVENOUS | Status: DC | PRN
  Administered 2018-03-27: 500 mL

## 2018-03-27 MED ORDER — GLYCOPYRROLATE PF 0.2 MG/ML IJ SOSY
PREFILLED_SYRINGE | INTRAMUSCULAR | Status: AC
  Filled 2018-03-27: qty 2

## 2018-03-27 MED ORDER — MIDAZOLAM HCL 5 MG/5ML IJ SOLN
INTRAMUSCULAR | Status: DC | PRN
  Administered 2018-03-27 (×2): 1 mg via INTRAVENOUS

## 2018-03-27 MED ORDER — IBUPROFEN 200 MG PO TABS: 800.0000 mg | ORAL_TABLET | Freq: Every day | ORAL | Status: DC | PRN

## 2018-03-27 MED ORDER — CYANOCOBALAMIN 500 MCG PO TABS
500.0000 ug | ORAL_TABLET | Freq: Every day | ORAL | Status: DC
  Filled 2018-03-27 (×2): qty 1

## 2018-03-27 MED ORDER — GLYCOPYRROLATE PF 0.2 MG/ML IJ SOSY
PREFILLED_SYRINGE | INTRAMUSCULAR | Status: AC
  Filled 2018-03-27: qty 1

## 2018-03-27 MED ORDER — ROCURONIUM BROMIDE 50 MG/5ML IV SOSY
PREFILLED_SYRINGE | INTRAVENOUS | Status: AC
  Filled 2018-03-27: qty 10

## 2018-03-27 MED ORDER — GABAPENTIN 300 MG PO CAPS
300.0000 mg | ORAL_CAPSULE | Freq: Once | ORAL | Status: AC
  Administered 2018-03-27: 300 mg via ORAL
  Filled 2018-03-27: qty 1

## 2018-03-27 MED ORDER — ASPIRIN EC 81 MG PO TBEC
81.0000 mg | DELAYED_RELEASE_TABLET | Freq: Every day | ORAL | Status: DC
  Administered 2018-03-27: 81 mg via ORAL
  Filled 2018-03-27: qty 1

## 2018-03-27 MED ORDER — OXYCODONE HCL 5 MG PO TABS: 10.0000 mg | ORAL_TABLET | ORAL | Status: DC | PRN

## 2018-03-27 MED ORDER — PHENYLEPHRINE 40 MCG/ML (10ML) SYRINGE FOR IV PUSH (FOR BLOOD PRESSURE SUPPORT)
PREFILLED_SYRINGE | INTRAVENOUS | Status: AC
  Filled 2018-03-27: qty 10

## 2018-03-27 MED ORDER — PANTOPRAZOLE SODIUM 40 MG PO TBEC
40.0000 mg | DELAYED_RELEASE_TABLET | Freq: Every day | ORAL | Status: DC
  Administered 2018-03-27: 40 mg via ORAL
  Filled 2018-03-27: qty 1

## 2018-03-27 MED ORDER — SODIUM CHLORIDE 0.9 % IV SOLN: 250.0000 mL | INTRAVENOUS | Status: DC

## 2018-03-27 MED ORDER — ROCURONIUM BROMIDE 10 MG/ML (PF) SYRINGE
PREFILLED_SYRINGE | INTRAVENOUS | Status: DC | PRN
  Administered 2018-03-27: 25 mg via INTRAVENOUS
  Administered 2018-03-27: 10 mg via INTRAVENOUS
  Administered 2018-03-27: 50 mg via INTRAVENOUS

## 2018-03-27 MED ORDER — ONDANSETRON HCL 4 MG PO TABS: 4.0000 mg | ORAL_TABLET | Freq: Four times a day (QID) | ORAL | Status: DC | PRN

## 2018-03-27 MED ORDER — LIDOCAINE 2% (20 MG/ML) 5 ML SYRINGE
INTRAMUSCULAR | Status: DC | PRN
  Administered 2018-03-27: 100 mg via INTRAVENOUS

## 2018-03-27 MED ORDER — 0.9 % SODIUM CHLORIDE (POUR BTL) OPTIME
TOPICAL | Status: DC | PRN
  Administered 2018-03-27 (×2): 1000 mL

## 2018-03-27 MED ORDER — NEOSTIGMINE METHYLSULFATE 5 MG/5ML IV SOSY
PREFILLED_SYRINGE | INTRAVENOUS | Status: DC | PRN
  Administered 2018-03-27: 3 mg via INTRAVENOUS

## 2018-03-27 MED ORDER — PHENYLEPHRINE 40 MCG/ML (10ML) SYRINGE FOR IV PUSH (FOR BLOOD PRESSURE SUPPORT)
PREFILLED_SYRINGE | INTRAVENOUS | Status: DC | PRN
  Administered 2018-03-27 (×3): 40 ug via INTRAVENOUS
  Administered 2018-03-27: 80 ug via INTRAVENOUS

## 2018-03-27 MED ORDER — GLYCOPYRROLATE PF 0.2 MG/ML IJ SOSY
PREFILLED_SYRINGE | INTRAMUSCULAR | Status: DC | PRN
  Administered 2018-03-27: 0.4 mg via INTRAVENOUS

## 2018-03-27 MED ORDER — MENTHOL 3 MG MT LOZG
1.0000 | LOZENGE | OROMUCOSAL | Status: DC | PRN
  Filled 2018-03-27: qty 9

## 2018-03-27 MED ORDER — HYDROMORPHONE HCL 1 MG/ML IJ SOLN: 0.2500 mg | INTRAMUSCULAR | Status: DC | PRN

## 2018-03-27 MED ORDER — ACETAMINOPHEN 500 MG PO TABS
1000.0000 mg | ORAL_TABLET | Freq: Once | ORAL | Status: AC
  Administered 2018-03-27: 1000 mg via ORAL
  Filled 2018-03-27: qty 2

## 2018-03-27 MED ORDER — SODIUM CHLORIDE 0.9% FLUSH
3.0000 mL | Freq: Two times a day (BID) | INTRAVENOUS | Status: DC
  Administered 2018-03-27 (×2): 3 mL via INTRAVENOUS

## 2018-03-27 MED ORDER — ACETAMINOPHEN 325 MG PO TABS: 650.0000 mg | ORAL_TABLET | ORAL | Status: DC | PRN

## 2018-03-27 MED ORDER — FLUTICASONE PROPIONATE 50 MCG/ACT NA SUSP
1.0000 | Freq: Every day | NASAL | Status: DC | PRN
  Filled 2018-03-27: qty 16

## 2018-03-27 MED ORDER — FENTANYL CITRATE (PF) 100 MCG/2ML IJ SOLN
INTRAMUSCULAR | Status: DC | PRN
  Administered 2018-03-27 (×4): 50 ug via INTRAVENOUS
  Administered 2018-03-27: 100 ug via INTRAVENOUS
  Administered 2018-03-27: 50 ug via INTRAVENOUS

## 2018-03-27 SURGICAL SUPPLY — 66 items
BAG DECANTER FOR FLEXI CONT (MISCELLANEOUS) ×2 IMPLANT
BASKET BONE COLLECTION (BASKET) ×2 IMPLANT
BENZOIN TINCTURE PRP APPL 2/3 (GAUZE/BANDAGES/DRESSINGS) ×2 IMPLANT
BIT DRILL 13 (BIT) ×2 IMPLANT
BIT DRILL NEURO 2X3.1 SFT TUCH (MISCELLANEOUS) ×1 IMPLANT
BONE VIVIGEN FORMABLE 1.3CC (Bone Implant) ×4 IMPLANT
BUR MATCHSTICK NEURO 3.0 LAGG (BURR) ×2 IMPLANT
CANISTER SUCT 3000ML PPV (MISCELLANEOUS) ×2 IMPLANT
CARTRIDGE OIL MAESTRO DRILL (MISCELLANEOUS) ×1 IMPLANT
DECANTER SPIKE VIAL GLASS SM (MISCELLANEOUS) IMPLANT
DERMABOND ADVANCED (GAUZE/BANDAGES/DRESSINGS) ×1
DERMABOND ADVANCED .7 DNX12 (GAUZE/BANDAGES/DRESSINGS) ×1 IMPLANT
DIFFUSER DRILL AIR PNEUMATIC (MISCELLANEOUS) ×2 IMPLANT
DRAIN SNY 7 FPER (WOUND CARE) ×2 IMPLANT
DRAPE C-ARM 42X72 X-RAY (DRAPES) ×4 IMPLANT
DRAPE LAPAROTOMY 100X72 PEDS (DRAPES) ×2 IMPLANT
DRAPE MICROSCOPE LEICA (MISCELLANEOUS) ×2 IMPLANT
DRILL NEURO 2X3.1 SOFT TOUCH (MISCELLANEOUS) ×2
DRSG OPSITE 4X5.5 SM (GAUZE/BANDAGES/DRESSINGS) IMPLANT
DRSG OPSITE POSTOP 4X6 (GAUZE/BANDAGES/DRESSINGS) ×2 IMPLANT
DURAPREP 6ML APPLICATOR 50/CS (WOUND CARE) ×2 IMPLANT
ELECT COATED BLADE 2.86 ST (ELECTRODE) ×2 IMPLANT
ELECT REM PT RETURN 9FT ADLT (ELECTROSURGICAL) ×2
ELECTRODE REM PT RTRN 9FT ADLT (ELECTROSURGICAL) ×1 IMPLANT
EVACUATOR SILICONE 100CC (DRAIN) ×2 IMPLANT
GAUZE 4X4 16PLY RFD (DISPOSABLE) IMPLANT
GAUZE SPONGE 4X4 12PLY STRL (GAUZE/BANDAGES/DRESSINGS) ×2 IMPLANT
GLOVE BIO SURGEON STRL SZ 6.5 (GLOVE) ×8 IMPLANT
GLOVE BIO SURGEON STRL SZ7 (GLOVE) ×4 IMPLANT
GLOVE BIO SURGEON STRL SZ8 (GLOVE) ×4 IMPLANT
GLOVE BIOGEL PI IND STRL 6.5 (GLOVE) ×3 IMPLANT
GLOVE BIOGEL PI IND STRL 7.0 (GLOVE) ×1 IMPLANT
GLOVE BIOGEL PI INDICATOR 6.5 (GLOVE) ×3
GLOVE BIOGEL PI INDICATOR 7.0 (GLOVE) ×1
GLOVE INDICATOR 8.5 STRL (GLOVE) ×2 IMPLANT
GOWN STRL REUS W/ TWL LRG LVL3 (GOWN DISPOSABLE) ×3 IMPLANT
GOWN STRL REUS W/ TWL XL LVL3 (GOWN DISPOSABLE) ×1 IMPLANT
GOWN STRL REUS W/TWL 2XL LVL3 (GOWN DISPOSABLE) IMPLANT
GOWN STRL REUS W/TWL LRG LVL3 (GOWN DISPOSABLE) ×3
GOWN STRL REUS W/TWL XL LVL3 (GOWN DISPOSABLE) ×1
HALTER HD/CHIN CERV TRACTION D (MISCELLANEOUS) ×2 IMPLANT
HEMOSTAT POWDER KIT SURGIFOAM (HEMOSTASIS) ×2 IMPLANT
HEMOSTAT POWDER SURGIFOAM 1G (HEMOSTASIS) ×4 IMPLANT
KIT BASIN OR (CUSTOM PROCEDURE TRAY) ×2 IMPLANT
KIT TURNOVER KIT B (KITS) ×2 IMPLANT
NEEDLE SPNL 20GX3.5 QUINCKE YW (NEEDLE) ×2 IMPLANT
NS IRRIG 1000ML POUR BTL (IV SOLUTION) ×4 IMPLANT
OIL CARTRIDGE MAESTRO DRILL (MISCELLANEOUS) ×2
PACK LAMINECTOMY NEURO (CUSTOM PROCEDURE TRAY) ×2 IMPLANT
PEN SKIN MARKING BROAD (MISCELLANEOUS) ×2 IMPLANT
PIN DISTRACTION 14MM (PIN) ×4 IMPLANT
PLATE 3 55XLCK NS SPNE CVD (Plate) ×1 IMPLANT
PLATE 3 ATLANTIS TRANS (Plate) ×1 IMPLANT
RUBBERBAND STERILE (MISCELLANEOUS) ×4 IMPLANT
SCREW ST FIX 4 ATL 3120213 (Screw) ×16 IMPLANT
SPACER ACDF TPS LG PARALLEL 7 (Spacer) ×2 IMPLANT
SPACER COLONIAL ACDF TPS LG S6 (Spacer) ×4 IMPLANT
SPONGE INTESTINAL PEANUT (DISPOSABLE) ×2 IMPLANT
SPONGE SURGIFOAM ABS GEL 100 (HEMOSTASIS) ×2 IMPLANT
STRIP CLOSURE SKIN 1/2X4 (GAUZE/BANDAGES/DRESSINGS) IMPLANT
SUT VIC AB 3-0 SH 8-18 (SUTURE) ×2 IMPLANT
SUT VIC AB 4-0 PS2 27 (SUTURE) ×2 IMPLANT
TAPE CLOTH 4X10 WHT NS (GAUZE/BANDAGES/DRESSINGS) ×2 IMPLANT
TOWEL GREEN STERILE (TOWEL DISPOSABLE) ×2 IMPLANT
TOWEL GREEN STERILE FF (TOWEL DISPOSABLE) IMPLANT
WATER STERILE IRR 1000ML POUR (IV SOLUTION) ×2 IMPLANT

## 2018-03-27 NOTE — H&P (Signed)
Sarah Mullen is an 62 y.o. female.   Chief Complaint: Neck pain bilateral shoulder and arm pain numbness tingling in her hands HPI: 62 year old female with long-standing neck pain bilateral shoulder and arm pain with numbness and tingling in her hands.  Work-up revealed severe cervical spondylosis with stenosis cord compression and signal change within the spinal cord predominantly behind C4-5 including C3-4 and C5-6.  Her clinical exam was consistent with a cervical myelopathy.  Due to patient's progressive medical syndrome imaging findings and failed conservative treatment I recommended anterior cervical discectomies and fusion at those 3 levels.  I have extensively gone over the risks and benefits of the operation with the patient as well as perioperative course expectations of outcome and alternatives of surgery she understood and agreed to proceed forward.  Past Medical History:  Diagnosis Date  . Allergic rhinitis, cause unspecified   . Blood transfusion    iron transfusion- [place]-2009  . Chronic headaches   . Depression   . Diverticulitis   . GERD (gastroesophageal reflux disease)   . Heart murmur    diag with echo-physiologic murmur; trace MR, trivial, early systolic MR by 54/65/03 echo ([facility])  . History of sinus tachycardia    07/04/07 Holter monitor: 69-154, average HR 95 bpm. Occ PACs, 4 runs probable atrial tachycardia  . Menometrorrhagia 11/21/2010  . Migraines   . Pre-diabetes     Past Surgical History:  Procedure Laterality Date  . ABDOMINAL HYSTERECTOMY  09/2011  . BREAST BIOPSY    . CERCLAGE REMOVAL     WHEN pregnant  . CESAREAN SECTION    . CYSTOSCOPY  10/17/2011   Procedure: CYSTOSCOPY;  Surgeon: Megan Salon, MD;  Location: [facility];  Service: Gynecology;;  . DILATION AND CURETTAGE OF UTERUS    . TONSILLECTOMY      Family History  Problem Relation Age of Onset  . Arthritis Mother   . Hyperlipidemia Mother   . Heart disease Mother   .  Arthritis Father   . Lung cancer Father        smoker  . Heart disease Father   . Hypertension Father   . Diabetes Father   . Colon cancer Paternal Aunt   . CAD Paternal Grandmother   . Kidney disease Maternal Grandfather        ESRD on HD  . Kidney disease Maternal Grandmother        ESRD on HD   Social History:  reports that she quit smoking about 16 years ago. Her smoking use included cigarettes. She has never used smokeless tobacco. She reports that she does not drink alcohol or use drugs.  Allergies:  Allergies  Allergen Reactions  . Kiwi Extract     Throat itching   . Contrast Media [Iodinated Diagnostic Agents] Rash  . Keflex [Cephalexin] Rash    Medications Prior to Admission  Medication Sig Dispense Refill  . aspirin EC 81 MG tablet Take 81 mg by mouth at bedtime.     Marland Kitchen aspirin-acetaminophen-caffeine (EXCEDRIN MIGRAINE) 250-250-65 MG tablet Take 2 tablets by mouth daily as needed for headache or migraine.    . cholecalciferol (VITAMIN D) 1000 units tablet Take 3,000 Units by mouth daily.    . DULoxetine (CYMBALTA) 60 MG capsule Take 1 capsule (60 mg total) by mouth daily. (Patient taking differently: Take 60 mg by mouth at bedtime. ) 90 capsule 2  . estradiol (MINIVELLE) 0.1 MG/24HR patch Place 1 patch (0.1 mg total) onto  the skin 2 (two) times a week. 24 patch 3  . fluticasone (FLONASE) 50 MCG/ACT nasal spray Place 1 spray into both nostrils daily as needed for allergies or rhinitis.    Marland Kitchen ibuprofen (ADVIL,MOTRIN) 200 MG tablet Take 800 mg by mouth daily as needed for moderate pain.    Marland Kitchen loratadine (CLARITIN) 10 MG tablet Take 10 mg by mouth at bedtime.    . NONFORMULARY OR COMPOUNDED ITEM Testosterone HRT (3ML) 1% cream, Apply 1/2 ml topically every morning as directed.  #60 grams 1 each 2  . pantoprazole (PROTONIX) 40 MG tablet Take 1 tablet (40 mg total) by mouth daily. (Patient taking differently: Take 40 mg by mouth at bedtime. ) 90 tablet 2  . vitamin B-12  (CYANOCOBALAMIN) 500 MCG tablet Take 500 mcg by mouth daily.    . vitamin C (ASCORBIC ACID) 500 MG tablet Take 500 mg by mouth daily.    . cyclobenzaprine (FLEXERIL) 10 MG tablet Take 0.5-1 tablets (5-10 mg total) by mouth at bedtime. (Patient taking differently: Take 10 mg by mouth daily as needed for muscle spasms. ) 20 tablet 0    Results for orders placed or performed during the hospital encounter of 03/27/18 (from the past 48 hour(s))  Glucose, capillary     Status: Abnormal   Collection Time: 03/27/18  6:59 AM  Result Value Ref Range   Glucose-Capillary 125 (H) 70 - 99 mg/dL   No results found.  Review of Systems  Musculoskeletal: Positive for joint pain and neck pain.  Neurological: Positive for tingling and sensory change.    Blood pressure (!) 142/90, pulse 97, temperature 98.7 F (37.1 C), temperature source Oral, resp. rate 20, last menstrual period 09/24/2011, SpO2 97 %. Physical Exam  Constitutional: She appears well-developed.  HENT:  Head: Normocephalic.  Eyes: Pupils are equal, round, and reactive to light.  Neck: Normal range of motion.  GI: Soft.  Neurological: She has normal strength. GCS eye subscore is 4. GCS verbal subscore is 5. GCS motor subscore is 6.  Strength is 5 out of 5 deltoid, bicep, tricep, wrist flexion, wrist extension, hand intrinsics.  Skin: Skin is warm and dry.     Assessment/Plan 62 year old female presents for an ACDF C3-4, C4-5, C5-6.  Zeta Bucy P, MD 03/27/2018, 7:54 AM

## 2018-03-27 NOTE — Op Note (Signed)
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  Postoperative diagnosis: Same  Procedure: Anterior cervical discectomies and fusion at C3-4, C4-5, C5-6 utilizing globus peek TPS coated cages packed with locally harvested autograft mixed with vivigen and anterior cervical plating utilizing the Atlantis translational plate with 8 fixed angle 13 mm screws  Surgeon: Kary Kos  Assistant: Glenford Peers  Anesthesia: General  EBL: Minimal  HPI: 62 year old female with long-standing neck pain bilateral shoulder and arm pain numbness tingling weakness in her hands.  Work-up revealed severe cervical stenosis cord compression with signal change within the cord behind C4-5 as well as severe stenosis C3-4 and C5-6.  Due to patient's failed conservative treatment imaging findings and progression of cortical syndrome I recommended anterior cervical discectomy and fusion at those 3 levels.  I extensively went over the risks and benefits of the operation with her as well as perioperative course expectations of outcome and alternatives to surgery and she understood and agreed to proceed forward.  Operative procedure: Patient brought in the original general anesthesia positioned supine the neck in slight extension 5 pounds halter traction the right side of her neck was prepped and draped in routine sterile fashion.  Preoperative x-ray localized the appropriate level so a curvilinear incision was made just off midline to the anterior border of the sternocleido mastoid and the superficial layer of the platysma was dissected out divided longitudinally the avascular plane between the sternomastoid and strap muscles was developed down to the prevertebral fascia.  Fascia dissected away with Kitners..  Intraoperative x-ray confirmed identification to the appropriate level.  Longus goes reflected laterally and self-retaining retractor was placed.  All 3  disc spaces were incised anterior ossified treatment of the Leksell rondure wanted to intermittent Kerrison punch.  All 3 disc spaces were drilled on the posterior annuluscomplex capturing the bone shavings and the mucus trap.  Under microscopic lamination first work at C5-6 disc the space which was noted markedly collapsed was drilled down to identify the posterior osteophyte and longitudinal ligament.  This was all removed in piecemeal fashion decompressing the central canal exposing the thecal sac and marching laterally both C6 pedicles were identified and both C6 nerve roots were widely decompressed.  Aggressive undermining of both endplates decompress the central canal.  After adequate decompression of been achieved centrally and foraminally this was packed with Gelfoam tension taken the C3-4.  C3-4 was a more preserved disc space more soft disc herniation with a large right-sided disc.  All this was aggressively removed and again aggressive undermining of both endplates and foraminotomies to decompress the central canal and the nerve roots.  This was also packed with Gelfoam tension taken to C4-5.  C4-5 was drilled down and aggressive and abutting also decompressed central canal pathology here was by foraminal stenosis with both C5 nerve roots were widely decompressed of the foramina.  After adequate central and foraminal decompression had been confirmed all 3 levels selected 6 mm peek TPS coated cages and after they packed with local autograft mixed with a were inserted at C3-4 and C4-5 and a 7 mm cage was inserted at 5 6.  Then a 55 mm translational plate was selected all screws and excellent purchase locking mechanisms were engaged postop fluoroscopy confirmed good position of all the implants medically seem status was maintained a JP drain was placed and the wound was closed in layers with inverted Vicryl skin was closed running 4 subcuticular Dermabond benzoin Steri-Strips and  a sterile dressing was  applied patient recovery in stable condition.  At the end the case on the account sponge counts were correct.

## 2018-03-27 NOTE — Anesthesia Postprocedure Evaluation (Signed)
Anesthesia Post Note  Patient: Sarah Mullen  Procedure(s) Performed: Anteroir Cervical Decrompression Fusion - Cervical Three - Cervical Four - Cervical Four - Cervical Five - Cervical Five - Cervical Six (N/A Spine Cervical)     Patient location during evaluation: PACU Anesthesia Type: General Level of consciousness: awake and alert Pain management: pain level controlled Vital Signs Assessment: post-procedure vital signs reviewed and stable Respiratory status: spontaneous breathing, nonlabored ventilation, respiratory function stable and patient connected to nasal cannula oxygen Cardiovascular status: blood pressure returned to baseline and stable Postop Assessment: no apparent nausea or vomiting Anesthetic complications: no    Last Vitals:  Vitals:   03/27/18 1325 03/27/18 1346  BP: 120/75 121/78  Pulse: 98 (!) 109  Resp: 12 16  Temp: 36.7 C 36.9 C  SpO2: 94% 97%    Last Pain:  Vitals:   03/27/18 1346  TempSrc: Oral  PainSc:     LLE Motor Response: (P) Purposeful movement (03/27/18 1428) LLE Sensation: (P) Full sensation (03/27/18 1428) RLE Motor Response: (P) Purposeful movement (03/27/18 1428) RLE Sensation: (P) Full sensation (03/27/18 1428)      Barnet Glasgow

## 2018-03-27 NOTE — Transfer of Care (Signed)
Immediate Anesthesia Transfer of Care Note  Patient: Sarah Mullen  Procedure(s) Performed: Anteroir Cervical Decrompression Fusion - Cervical Three - Cervical Four - Cervical Four - Cervical Five - Cervical Five - Cervical Six (N/A Spine Cervical)  Patient Location: PACU  Anesthesia Type:General  Level of Consciousness: patient cooperative and responds to stimulation  Airway & Oxygen Therapy: Patient Spontanous Breathing and Patient connected to face mask oxygen  Post-op Assessment: Report given to RN, Post -op Vital signs reviewed and stable and Patient moving all extremities X 4  Post vital signs: Reviewed and stable  Last Vitals:  Vitals Value Taken Time  BP 126/80 03/27/2018 12:00 PM  Temp 36.2 C 03/27/2018 12:00 PM  Pulse 100 03/27/2018 12:01 PM  Resp 10 03/27/2018 12:01 PM  SpO2 95 % 03/27/2018 12:01 PM  Vitals shown include unvalidated device data.  Last Pain:  Vitals:   03/27/18 0733  TempSrc:   PainSc: 1          Complications: No apparent anesthesia complications

## 2018-03-27 NOTE — Progress Notes (Signed)
Pharmacy Antibiotic Note  Sarah Mullen is a 62 y.o. female admitted on 03/27/2018 for planned spinal surgery.  Pharmacy has been consulted for Vancomycin dosing post-op for surgical prophylaxis while the drain remains in place.   Pre-op Vancomycin dose given around 0800. Confirmed with RN that a drain is in place. Pre-op labs from 8/26 show SCr 0.82, CrCl~60-70 ml/min.   Plan: - Vancomycin 1g IV every 12 hours (while the drain remains in place) - Will monitor for any necessary dose adjustments    Temp (24hrs), Avg:98.1 F (36.7 C), Min:97.2 F (36.2 C), Max:98.7 F (37.1 C)  No results for input(s): WBC, CREATININE, LATICACIDVEN, VANCOTROUGH, VANCOPEAK, VANCORANDOM, GENTTROUGH, GENTPEAK, GENTRANDOM, TOBRATROUGH, TOBRAPEAK, TOBRARND, AMIKACINPEAK, AMIKACINTROU, AMIKACIN in the last 168 hours.  Estimated Creatinine Clearance: 68.6 mL/min (by C-G formula based on SCr of 0.82 mg/dL).    Allergies  Allergen Reactions  . Kiwi Extract     Throat itching   . Contrast Media [Iodinated Diagnostic Agents] Rash  . Keflex [Cephalexin] Rash     Thank you for allowing pharmacy to be a part of this patient's care.  Alycia Rossetti, PharmD, BCPS Pager: [phone] 2:14 PM

## 2018-03-27 NOTE — Anesthesia Procedure Notes (Signed)
Procedure Name: Intubation Date/Time: 03/27/2018 8:38 AM Performed by: Verdie Drown, CRNA Pre-anesthesia Checklist: Patient identified, Emergency Drugs available, Suction available, Patient being monitored and Timeout performed Patient Re-evaluated:Patient Re-evaluated prior to induction Oxygen Delivery Method: Circle system utilized Preoxygenation: Pre-oxygenation with 100% oxygen Induction Type: IV induction Ventilation: Mask ventilation without difficulty Laryngoscope Size: Mac and 3 Grade View: Grade II Tube type: Oral Tube size: 7.5 mm Number of attempts: 2 Airway Equipment and Method: Bougie stylet Placement Confirmation: ETT inserted through vocal cords under direct vision,  positive ETCO2,  CO2 detector and breath sounds checked- equal and bilateral Secured at: 22 cm Tube secured with: Tape Dental Injury: Teeth and Oropharynx as per pre-operative assessment  Difficulty Due To: Difficulty was anticipated Comments: Airway note from previous sx indicated anterior airway and use of Glidescope for successful intubation.  Glidescope on standby in OR. DL x 1 w MAC3, G2 view unable to pass ETT d/t anterior VC. DL x 2 with MAC3 and use of bougie stylet for atraumatic, successful intubation.

## 2018-03-28 ENCOUNTER — Encounter (HOSPITAL_COMMUNITY): Payer: Self-pay | Admitting: Neurosurgery

## 2018-03-28 MED ORDER — CYCLOBENZAPRINE HCL 10 MG PO TABS: 10.0000 mg | ORAL_TABLET | Freq: Three times a day (TID) | ORAL | 0 refills | Status: DC | PRN

## 2018-03-28 MED ORDER — HYDROCODONE-ACETAMINOPHEN 5-325 MG PO TABS: 1.0000 | ORAL_TABLET | ORAL | 0 refills | Status: DC | PRN

## 2018-03-28 MED FILL — Gelatin Absorbable MT Powder: OROMUCOSAL | Qty: 1 | Status: AC

## 2018-03-28 MED FILL — Thrombin For Soln 5000 Unit: CUTANEOUS | Qty: 5000 | Status: AC

## 2018-03-28 MED FILL — HYDROCODON-APAP 5-325: 5-325 | 5 days supply | Qty: 40 | Fill #0

## 2018-03-28 MED FILL — CYCLOBENZAPRINE 10 MG TAB: 10 | 10 days supply | Qty: 30 | Fill #0

## 2018-03-28 NOTE — Evaluation (Signed)
Occupational Therapy Evaluation and Discharge Patient Details Name: Sarah Mullen MRN: [id number] DOB: 05-18-1956 Today's Date: 03/28/2018    History of Present Illness s/p ACDF   Clinical Impression   All education completed at detailed below with pt verbalizing understanding. Reinforced education with handout. Pt has excellent support at home. No further OT needs.    Follow Up Recommendations  No OT follow up    Equipment Recommendations  None recommended by OT    Recommendations for Other Services       Precautions / Restrictions Precautions Precautions: Cervical Precaution Booklet Issued: Yes (comment) Precaution Comments: educated pt in cervical precautions related to ADL and IADL Required Braces or Orthoses: Cervical Brace Cervical Brace: Soft collar Restrictions Weight Bearing Restrictions: No      Mobility Bed Mobility               General bed mobility comments: instructed in log roll technique  Transfers Overall transfer level: Modified independent                    Balance                                           ADL either performed or assessed with clinical judgement   ADL Overall ADL's : Modified independent                                       General ADL Comments: Pt educated in cervical precautions related to bed mobility, self care and IADL. Instructed in fall prevention.     Vision Baseline Vision/History: Wears glasses Wears Glasses: At all times Patient Visual Report: No change from baseline       Perception     Praxis      Pertinent Vitals/Pain Pain Assessment: Faces Faces Pain Scale: Hurts a little bit Pain Location: incision Pain Descriptors / Indicators: Operative site guarding Pain Intervention(s): Monitored during session     Hand Dominance Right   Extremity/Trunk Assessment Upper Extremity Assessment Upper Extremity Assessment: Overall WFL for tasks assessed(reports  numbness/tingling in B hands)   Lower Extremity Assessment Lower Extremity Assessment: Overall WFL for tasks assessed       Communication Communication Communication: No difficulties   Cognition Arousal/Alertness: Awake/alert Behavior During Therapy: WFL for tasks assessed/performed Overall Cognitive Status: Within Functional Limits for tasks assessed                                     General Comments       Exercises     Shoulder Instructions      Home Living Family/patient expects to be discharged to:: Private residence Living Arrangements: Spouse/significant other Available Help at Discharge: Family;Friend(s) Type of Home: House Home Access: Stairs to enter [employer] of Steps: 1 Entrance Stairs-Rails: None Home Layout: One level     Bathroom Shower/Tub: Teacher, early years/pre: Standard     Home Equipment: None          Prior Functioning/Environment Level of Independence: Independent        Comments: pt is a Marine scientist at [employer]        OT Problem List:  OT Treatment/Interventions:      OT Goals(Current goals can be found in the care plan section) Acute Rehab OT Goals Patient Stated Goal: to return to work  OT Frequency:     Barriers to D/C:            Co-evaluation              AM-PAC PT "6 Clicks" Daily Activity     Outcome Measure Help from another person eating meals?: None Help from another person taking care of personal grooming?: None Help from another person toileting, which includes using toliet, bedpan, or urinal?: None Help from another person bathing (including washing, rinsing, drying)?: None Help from another person to put on and taking off regular upper body clothing?: None Help from another person to put on and taking off regular lower body clothing?: None 6 Click Score: 24   End of Session    Activity Tolerance: Patient tolerated treatment well Patient left: in  bed;with call bell/phone within reach;with family/visitor present  OT Visit Diagnosis: Pain                Time: 7001-7494 OT Time Calculation (min): 22 min Charges:  OT General Charges $OT Visit: 1 Visit OT Evaluation $OT Eval Low Complexity: 1 Low  03/28/2018 Nestor Lewandowsky, OTR/L Pager: [phone]  Malka So 03/28/2018, 9:08 AM

## 2018-03-28 NOTE — Discharge Summary (Signed)
  Physician Discharge Summary  Patient ID: Sarah Mullen MRN: [id number] DOB/AGE: Jan 05, 1956 62 y.o. Estimated body mass index is 33.77 kg/m as calculated from the following:   Height as of 03/18/18: 5\' 1"  (1.549 m).   Weight as of 03/18/18: 81.1 kg.   Admit date: 03/27/2018 Discharge date: 03/28/2018  Admission Diagnoses: Cervical spondylitic myelopathy  Discharge Diagnoses: Same Active Problems:   Spinal stenosis in cervical region   Discharged Condition: good  Hospital Course: Patient was admitted to the hospital underwent 3 level anterior cervical discectomy and fusion.  Postoperatively patient did very well recovering in the floor on the floor was ablating voiding spontaneously tolerating a soft regular diet.  Patient stable for discharge on postop day 1.  Patient will be discharged with hydrocodone and Flexeril with instructions for follow-up in 1 to 2 weeks.  Consults: Significant Diagnostic Studies: Treatments: ACDF C3-4 C4-5 C5-6 Discharge Exam: Blood pressure 121/75, pulse 99, temperature 98.7 F (37.1 C), temperature source Oral, resp. rate 18, last menstrual period 09/24/2011, SpO2 95 %. Strength 5 out of 5 wound clean dry and intact  Disposition: Home      Signed: Evalena Fujii P 03/28/2018, 8:27 AM

## 2018-03-28 NOTE — Discharge Instructions (Signed)

## 2018-03-28 NOTE — Progress Notes (Signed)
Patient alert and oriented, mae's well, voiding adequate amount of urine, swallowing without difficulty, no c/o pain at time of discharge. Patient discharged home with family. Script and discharged instructions given to patient. Patient and family stated understanding of instructions given. Patient has an appointment with Dr. Cram 

## 2018-03-29 ENCOUNTER — Other Ambulatory Visit: Payer: Self-pay | Admitting: *Deleted

## 2018-03-29 NOTE — Patient Outreach (Signed)
[facility]) Care Management  03/29/2018  Sarah Mullen 1956-01-17 [id number]   Subjective:  Telephone call to patient's home  / mobile number, no answer, left HIPAA compliant voicemail message, and requested call back.   Objective:Per KPN (Knowledge Performance Now, point of care tool) and chart review,patient hospitalized 03/27/18 -03/28/18 for Cervical spondylitic myelopathy from cervical stenosis and cord compression at C3-4 C4-5 C5-6.   Status post Anterior cervical discectomies and fusion at C3-4, C4-5, C5-6 utilizing globus peek TPS coated cages packed with locally harvested autograft mixed with vivigen and anterior cervical plating utilizing the Atlantis translational plate with 8 fixed angle 13 mm screws on 03/27/18.  Patient also has a history of prediabetes, vitamin d deficiency, and cervical radiculopathy.      Assessment: Received [facility]MR Preoperative / Transition of care referral on 03/13/18.Preoperative call completed, and Transition of care follow up pending patient contact.      Plan:RNCM will send unsuccessful outreach  letter, [id number] pamphlet, will call patient for 2nd telephone outreach attempt, transition of care follow up, and proceed with case closure, within 10 business days if no return call.         Kathelyn Gombos H. Annia Friendly, BSN, [place] Management [facility] Telephonic CM Phone: [phone] Fax: [phone]

## 2018-04-02 ENCOUNTER — Encounter: Payer: Self-pay | Admitting: *Deleted

## 2018-04-02 ENCOUNTER — Other Ambulatory Visit: Payer: Self-pay | Admitting: *Deleted

## 2018-04-02 MED FILL — MAGIC MOUTHWASH BOP FORM: 6 days supply | Qty: 120 | Fill #0

## 2018-04-02 MED FILL — FLUCONAZOLE 100 MG TABLET: 100 | 7 days supply | Qty: 14 | Fill #0

## 2018-04-02 NOTE — Patient Outreach (Signed)
[facility]) Care Management  04/02/2018  Sarah Mullen May 26, 1956 [id number]   Subjective: Telephone call to patient's home / mobile number, spoke with patient, and HIPAA verified.  Discussed [facility] Care Management UMR Transition of care follow up, patient voiced understanding, and is in agreement to follow up.  Patient states she is doing great except for having an allergic reaction to antibiotics, which caused her to have yeast infection, and sore throat.  States her MD is aware of the reaction, has ordered some diflucan, she will start medication later today, and has a follow up appointment with surgeon on 04/09/18.  Patient states she is able to manage self care and has assistance as needed with activities of daily living / home management. Patient voices understanding of medical diagnosis, surgery,  and treatment plan.   [facility] benefits discussed on 03/18/18 preoperative call and patient states no additional questions at this time.  Patient states she does not have any education material, transition of care, care coordination, disease management, disease monitoring, transportation, community resource, or pharmacy needs at this time. States she is very appreciative of the follow up and is in agreement to receive [place] Management information.     Objective:Per KPN (Knowledge Performance Now, point of care tool) and chart review,patient hospitalized 03/27/18 -03/28/18 for Cervical spondylitic myelopathy from cervical stenosis and cord compression at C3-4 C4-5 C5-6.   Status post Anterior cervical discectomies and fusion at C3-4, C4-5, C5-6 utilizing globus peek TPS coated cages packed with locally harvested autograft mixed withvivigenand anterior cervical plating utilizing the Atlantis translational plate with 8 fixed angle 13 mm screws on 03/27/18.  Patient also has a history of prediabetes, vitamin d deficiency, and cervical radiculopathy.      Assessment: Received UMR  Preoperative / Transition of care referral on 03/13/18.Preoperative call completed, and Transition of care follow up completed, no care management needs, and will proceed with case closure.       Plan:RNCM will send patient successful outreach letter, [facility] pamphlet, and magnet. RNCM will complete case closure due to follow up completed / no care management needs.        Juron Vorhees H. Annia Friendly, BSN, [place] Management [facility] Telephonic CM Phone: [phone] Fax: [phone]

## 2018-04-09 MED FILL — HYDROCODON-APAP 5-325: 5-325 | 15 days supply | Qty: 60 | Fill #0

## 2018-04-17 MED FILL — metroNIDAZOLE 0.75 % LOTN: 0.75 | 30 days supply | Qty: 59 | Fill #0

## 2018-04-19 ENCOUNTER — Other Ambulatory Visit: Payer: Self-pay | Admitting: Internal Medicine

## 2018-04-19 MED FILL — DULoxetine HCL 60 MG CPEP: 60 | 90 days supply | Qty: 90 | Fill #2

## 2018-04-19 MED FILL — ESTRADIOL 0.1 MG/24HR PTTW: 0.1 | 84 days supply | Qty: 24 | Fill #2

## 2018-04-19 MED FILL — CYCLOBENZAPRINE 10 MG TAB: 10 | 20 days supply | Qty: 20 | Fill #0

## 2018-04-19 MED FILL — PANTOPRAZOLE SOD DR 40 MG T: 40 | 90 days supply | Qty: 90 | Fill #2

## 2018-05-29 ENCOUNTER — Encounter: Payer: Self-pay | Admitting: Internal Medicine

## 2018-05-29 DIAGNOSIS — Z1211 Encounter for screening for malignant neoplasm of colon: Secondary | ICD-10-CM

## 2018-06-06 ENCOUNTER — Encounter: Payer: Self-pay | Admitting: Gastroenterology

## 2018-06-18 ENCOUNTER — Encounter: Payer: Self-pay | Admitting: Internal Medicine

## 2018-06-18 ENCOUNTER — Encounter: Payer: Self-pay | Admitting: Physical Therapy

## 2018-06-18 ENCOUNTER — Ambulatory Visit (INDEPENDENT_AMBULATORY_CARE_PROVIDER_SITE_OTHER): Payer: No Typology Code available for payment source | Admitting: Physical Therapy

## 2018-06-18 DIAGNOSIS — R293 Abnormal posture: Secondary | ICD-10-CM

## 2018-06-18 DIAGNOSIS — M542 Cervicalgia: Secondary | ICD-10-CM | POA: Diagnosis not present

## 2018-06-18 DIAGNOSIS — M6281 Muscle weakness (generalized): Secondary | ICD-10-CM

## 2018-06-18 NOTE — Patient Instructions (Signed)
Access Code: [email or web address]  URL: [email or web address]  Date: 06/18/2018  Prepared by: Faustino Congress   Exercises  Shoulder Internal Rotation with Resistance - 10 reps - 3 sets - 1x daily - 7x weekly  Shoulder External Rotation with Anchored Resistance - 10 reps - 3 sets - 1x daily - 7x weekly  Standing Bilateral Low Shoulder Row with Anchored Resistance - 10 reps - 3 sets - 1x daily - 7x weekly  Standing Shoulder Horizontal Abduction with Resistance - 10 reps - 3 sets - 1x daily - 7x weekly  Standing Single Arm Shoulder Abduction with Resistance - 10 reps - 3 sets - 1x daily - 7x weekly  Standing Single Arm Shoulder Flexion with Posterior Anchored Resistance - 10 reps - 3 sets - 1x daily - 7x weekly

## 2018-06-18 NOTE — Therapy (Signed)
[facility] [place], [place], [address] Phone: [phone]   Fax:  [phone]  Physical Therapy Evaluation  Patient Details  Name: Sarah Mullen MRN: [id number] Date of Birth: Jan 07, 1956 Referring Provider (PT): Kary Kos, MD   Encounter Date: 06/18/2018  PT End of Session - 06/18/18 1209    Visit Number  1    Number of Visits  12    Date for PT Re-Evaluation  07/30/18    Authorization Type  [place] [employer]    PT Start Time  1119    PT Stop Time  1159    PT Time Calculation (min)  40 min    Activity Tolerance  Patient tolerated treatment well    Behavior During Therapy  [facility] for tasks assessed/performed       Past Medical History:  Diagnosis Date  . Allergic rhinitis, cause unspecified   . Blood transfusion    iron transfusion- [place]-2009  . Chronic headaches   . Depression   . Diverticulitis   . GERD (gastroesophageal reflux disease)   . Heart murmur    diag with echo-physiologic murmur; trace MR, trivial, early systolic MR by 19/50/93 echo ([facility])  . History of sinus tachycardia    07/04/07 Holter monitor: 69-154, average HR 95 bpm. Occ PACs, 4 runs probable atrial tachycardia  . Menometrorrhagia 11/21/2010  . Migraines   . Pre-diabetes     Past Surgical History:  Procedure Laterality Date  . ABDOMINAL HYSTERECTOMY  09/2011  . ANTERIOR CERVICAL DECOMP/DISCECTOMY FUSION N/A 03/27/2018   Procedure: Anteroir Cervical Decrompression Fusion - Cervical Three - Cervical Four - Cervical Four - Cervical Five - Cervical Five - Cervical Six;  Surgeon: Kary Kos, MD;  Location: [facility];  Service: Neurosurgery;  Laterality: N/A;  Anteroir Cervical Decrompression Fusion - Cervical Three - Cervical Four - Cervical Four - Cervical Five - Cervical Five - Cervical Six  . BREAST BIOPSY    . CERCLAGE REMOVAL     WHEN pregnant  . CESAREAN SECTION    . CYSTOSCOPY  10/17/2011   Procedure:  CYSTOSCOPY;  Surgeon: Megan Salon, MD;  Location: [facility];  Service: Gynecology;;  . DILATION AND CURETTAGE OF UTERUS    . TONSILLECTOMY      There were no vitals filed for this visit.   Subjective Assessment - 06/18/18 1121    Subjective  Pt is a 62 y/o female who presents to OPPT s/p ACDF C3-6 on 03/27/18.  Pt reports she went to MD last week, and MD recommended 4-6 weeks of PT to regain strength in order to return to work.    Patient Stated Goals  improve strength, return to work    Currently in Pain?  Yes    Pain Score  0-No pain   c/o occasional soreness        [facility] PT Assessment - 06/18/18 1123      Assessment   Medical Diagnosis  ACDF C3-6    Referring Provider (PT)  Kary Kos, MD    Onset Date/Surgical Date  03/27/18    Hand Dominance  Right    Next MD Visit  07/11/18    Prior Therapy  intermittent; has PT for neck 2 years prior to surgery      Precautions   Precaution Comments  no traction or jumping      Restrictions   Weight Bearing Restrictions  No      Balance Screen  Has the patient fallen in the past 6 months  No    Has the patient had a decrease in activity level because of a fear of falling?   No    Is the patient reluctant to leave their home because of a fear of falling?   No      Home Environment   Living Environment  Private residence    Additional Comments  denies difficulty with ADLs      Prior Function   Level of Independence  Independent    Vocation  Full time employment   currently out of work   Loss adjuster, chartered on cardiac unit    Leisure  "nothing" no regular exercise      Cognition   Overall Cognitive Status  Within Functional Limits for tasks assessed      Observation/Other Assessments   Focus on Therapeutic Outcomes (FOTO)   47 (53% limited; predicted 41% limited)      Posture/Postural Control   Posture/Postural Control  Postural limitations    Postural Limitations  Rounded Shoulders;Forward head;Increased thoracic  kyphosis      ROM / Strength   AROM / PROM / Strength  AROM;Strength      AROM   AROM Assessment Site  Cervical    Cervical Flexion  44    Cervical Extension  37    Cervical - Right Side Bend  31    Cervical - Left Side Bend  35      Strength   Strength Assessment Site  Shoulder;Elbow    Right/Left Shoulder  Right;Left    Right Shoulder Flexion  4/5    Right Shoulder ABduction  3+/5    Right Shoulder Internal Rotation  5/5    Right Shoulder External Rotation  4/5    Left Shoulder Flexion  3+/5    Left Shoulder ABduction  3/5    Left Shoulder Internal Rotation  4/5    Left Shoulder External Rotation  3/5    Right/Left Elbow  Right;Left    Right Elbow Flexion  5/5    Right Elbow Extension  5/5    Left Elbow Flexion  5/5    Left Elbow Extension  4/5                Objective measurements completed on examination: See above findings.      [facility] Adult PT Treatment/Exercise - 06/18/18 1207      Exercises   Exercises  Shoulder      Shoulder Exercises: Standing   Horizontal ABduction  Both;5 reps;Theraband    Theraband Level (Shoulder Horizontal ABduction)  Level 3 (Green)    External Rotation  Both;5 reps;Theraband    Theraband Level (Shoulder External Rotation)  Level 2 (Red);Level 3 (Green)   Red: Lt; Green: Rt   Internal Rotation  Both;5 reps;Theraband    Theraband Level (Shoulder Internal Rotation)  Level 3 (Green)    Flexion  Both;5 reps;Theraband    Theraband Level (Shoulder Flexion)  Level 2 (Red)    ABduction  Both;5 reps;Theraband    Theraband Level (Shoulder ABduction)  Level 2 (Red)    Row  Both;5 reps;Theraband    Theraband Level (Shoulder Row)  Level 3 Nyoka Cowden)             PT Education - 06/18/18 1209    Education Details  HEP    Person(s) Educated  Patient    Methods  Explanation;Demonstration;Handout    Comprehension  Verbalized understanding;Returned demonstration;Need further instruction  PT Long Term Goals - 06/18/18  1216      PT LONG TERM GOAL #1   Title  independent with HEP    Status  New    Target Date  07/30/18      PT LONG TERM GOAL #2   Title  verbalize and demonstrate proper lifting techniques to decrease risk of injury    Status  New    Target Date  07/30/18      PT LONG TERM GOAL #3   Title  demonstrate at least 4+/5 bil shoulder strength for improved strength and ability to return to work    Status  New    Target Date  07/30/18      PT LONG TERM GOAL #4   Title  FOTO score improved to </= 41% limited for improved function    Status  New    Target Date  07/30/18             Plan - 06/18/18 1206    Clinical Impression Statement  Pt is a 62 y/o female who presents to OPPT s/p C3-6 ACDF on 03/27/18.  Pt demonstrates postural abnormalities and decreased strength affecting ability to return to work.  Pt will benefit from PT to address deficits listed.    Clinical Presentation  Stable    Clinical Decision Making  Low    Rehab Potential  Good    PT Frequency  2x / week    PT Duration  6 weeks    PT Treatment/Interventions  ADLs/Self Care Home Management;Cryotherapy;Electrical Stimulation;Moist Heat;Therapeutic activities;Therapeutic exercise;Patient/family education;Neuromuscular re-education;Manual techniques;Taping;Passive range of motion    PT Next Visit Plan  review HEP and progress exercises; would benefit from core and postural exercises    PT Home Exercise Plan  Access Code: [email or web address]    Consulted and Agree with Plan of Care  Patient       Patient will benefit from skilled therapeutic intervention in order to improve the following deficits and impairments:  Postural dysfunction, Decreased range of motion, Decreased strength, Impaired UE functional use  Visit Diagnosis: Cervicalgia - Plan: PT plan of care cert/re-cert  Abnormal posture - Plan: PT plan of care cert/re-cert  Muscle weakness (generalized) - Plan: PT plan of care cert/re-cert     Problem List Patient  Active Problem List   Diagnosis Date Noted  . Spinal stenosis in cervical region 03/27/2018  . Testosterone deficiency 08/22/2017  . Hyperlipidemia 08/22/2017  . Chronic lower back pain 08/22/2017  . Cervical myelopathy ([place]) 05/15/2016  . Vitamin D deficiency 05/15/2016  . Prediabetes 05/14/2016  . Fatty liver 03/09/2016  . Positive TB test 05/28/2012  . GERD (gastroesophageal reflux disease) 11/21/2010  . Depression 11/21/2010  . Dyshidrotic eczema 11/21/2010      Laureen Abrahams, PT, DPT 06/18/18 12:23 PM     [facility] [place], [place], [address] Phone: [phone]   Fax:  [phone]  Name: Sarah Mullen MRN: [id number] Date of Birth: 28-Oct-1955

## 2018-06-24 ENCOUNTER — Encounter: Payer: Self-pay | Admitting: Physical Therapy

## 2018-06-24 ENCOUNTER — Ambulatory Visit (INDEPENDENT_AMBULATORY_CARE_PROVIDER_SITE_OTHER): Payer: No Typology Code available for payment source | Admitting: Physical Therapy

## 2018-06-24 DIAGNOSIS — M542 Cervicalgia: Secondary | ICD-10-CM

## 2018-06-24 DIAGNOSIS — M6281 Muscle weakness (generalized): Secondary | ICD-10-CM | POA: Diagnosis not present

## 2018-06-24 DIAGNOSIS — R293 Abnormal posture: Secondary | ICD-10-CM | POA: Diagnosis not present

## 2018-06-24 NOTE — Therapy (Signed)
[place], [place], [address] Phone: [phone]   Fax:  [phone]  Physical Therapy Treatment  Patient Details  Name: Sarah Mullen MRN: [id number] Date of Birth: [birth year] Referring Provider (PT): Kary Kos, MD   Encounter Date: 06/24/2018  PT End of Session - 06/24/18 1246    Visit Number  2    Number of Visits  12    Date for PT Re-Evaluation  07/30/18    Authorization Type  [place] [employer]    PT Start Time  4970    PT Stop Time  1100    PT Time Calculation (min)  45 min    Activity Tolerance  Patient tolerated treatment well    Behavior During Therapy  [facility] for tasks assessed/performed       Past Medical History:  Diagnosis Date  . Allergic rhinitis, cause unspecified   . Blood transfusion    iron transfusion- [place]-2009  . Chronic headaches   . Depression   . Diverticulitis   . GERD (gastroesophageal reflux disease)   . Heart murmur    diag with echo-physiologic murmur; trace MR, trivial, early systolic MR by 26/37/85 echo ([facility])  . History of sinus tachycardia    07/04/07 Holter monitor: 69-154, average HR 95 bpm. Occ PACs, 4 runs probable atrial tachycardia  . Menometrorrhagia 11/21/2010  . Migraines   . Pre-diabetes     Past Surgical History:  Procedure Laterality Date  . ABDOMINAL HYSTERECTOMY  09/2011  . ANTERIOR CERVICAL DECOMP/DISCECTOMY FUSION N/A 03/27/2018   Procedure: Anteroir Cervical Decrompression Fusion - Cervical Three - Cervical Four - Cervical Four - Cervical Five - Cervical Five - Cervical Six;  Surgeon: Kary Kos, MD;  Location: [facility];  Service: Neurosurgery;  Laterality: N/A;  Anteroir Cervical Decrompression Fusion - Cervical Three - Cervical Four - Cervical Four - Cervical Five - Cervical Five - Cervical Six  . BREAST BIOPSY    . CERCLAGE REMOVAL     WHEN pregnant  . CESAREAN SECTION    . CYSTOSCOPY  10/17/2011   Procedure:  CYSTOSCOPY;  Surgeon: Megan Salon, MD;  Location: [facility];  Service: Gynecology;;  . DILATION AND CURETTAGE OF UTERUS    . TONSILLECTOMY      There were no vitals filed for this visit.  Subjective Assessment - 06/24/18 1238    Subjective  Pt arriving to therapy reporting 1-2/10 pain more soreness from beginning exercises per pt report.     Patient Stated Goals  improve strength, return to work    Currently in Pain?  Yes    Pain Score  2     Pain Location  Neck    Pain Orientation  Anterior    Pain Descriptors / Indicators  Sore    Pain Type  Surgical pain    Pain Frequency  Intermittent    Aggravating Factors   new exercises certain movements, positions    Pain Relieving Factors  resting    Multiple Pain Sites  No                       [facility] Adult PT Treatment/Exercise - 06/24/18 0001      Exercises   Exercises  Shoulder      Shoulder Exercises: Seated   Other Seated Exercises  wand shoulder flexion and ER on L side x 10 reps holding 10 seconds      Shoulder  Exercises: Standing   Horizontal ABduction  Both;15 reps;Theraband    Theraband Level (Shoulder Horizontal ABduction)  Level 3 (Green)    External Rotation  Both;15 reps;Theraband    Theraband Level (Shoulder External Rotation)  Level 2 (Red);Level 3 (Green)   Red: Lt; Green: Rt   Internal Rotation  Both;15 reps;Theraband    Theraband Level (Shoulder Internal Rotation)  Level 3 (Green)    Flexion  Both;15 reps;Theraband    Theraband Level (Shoulder Flexion)  Level 2 (Red)    ABduction  Both;5 reps;Theraband    Theraband Level (Shoulder ABduction)  Level 2 (Red)    Extension  AROM;Both;15 reps;Limitations    Extension Limitations  using a cane behind her back    Row  Both;5 reps;Theraband    Theraband Level (Shoulder Row)  Level 3 (Green)    Other Standing Exercises  yellow weighted ball rolling up wall with bilateral UE's x 15 reps      Shoulder Exercises: Stretch   Corner Stretch  3 reps;10  seconds      Manual Therapy   Manual Therapy  Joint mobilization    Joint Mobilization  L scapular mobs in sidelying position             PT Education - 06/24/18 1245    Education Details  HEP     Person(s) Educated  Patient    Methods  Explanation;Demonstration;Handout    Comprehension  Verbalized understanding;Returned demonstration          PT Long Term Goals - 06/18/18 1216      PT LONG TERM GOAL #1   Title  independent with HEP    Status  New    Target Date  07/30/18      PT LONG TERM GOAL #2   Title  verbalize and demonstrate proper lifting techniques to decrease risk of injury    Status  New    Target Date  07/30/18      PT LONG TERM GOAL #3   Title  demonstrate at least 4+/5 bil shoulder strength for improved strength and ability to return to work    Status  New    Target Date  07/30/18      PT LONG TERM GOAL #4   Title  FOTO score improved to </= 41% limited for improved function    Status  New    Target Date  07/30/18            Plan - 06/24/18 1246    Clinical Impression Statement  Pt tolerating all exercises well and reviewed HEP. Pt reporting mild soreness with pain of 1-2/10. Pt reporting 1/10 pain after session ended. Continue with skilled PT to progress pt toward LTG's set at eval.     Clinical Presentation  Stable    Rehab Potential  Good    PT Frequency  2x / week    PT Duration  6 weeks    PT Treatment/Interventions  ADLs/Self Care Home Management;Cryotherapy;Electrical Stimulation;Moist Heat;Therapeutic activities;Therapeutic exercise;Patient/family education;Neuromuscular re-education;Manual techniques;Taping;Passive range of motion    PT Next Visit Plan  review HEP and progress exercises; would benefit from core and postural exercises    PT Home Exercise Plan  Access Code: [email or web address], added exercises in pt instructions    Consulted and Agree with Plan of Care  Patient       Patient will benefit from skilled therapeutic  intervention in order to improve the following deficits and impairments:  Postural dysfunction, Decreased range of motion,  Decreased strength, Impaired UE functional use  Visit Diagnosis: Cervicalgia  Abnormal posture  Muscle weakness (generalized)     Problem List Patient Active Problem List   Diagnosis Date Noted  . Spinal stenosis in cervical region 03/27/2018  . Testosterone deficiency 08/22/2017  . Hyperlipidemia 08/22/2017  . Chronic lower back pain 08/22/2017  . Cervical myelopathy ([place]) 05/15/2016  . Vitamin D deficiency 05/15/2016  . Prediabetes 05/14/2016  . Fatty liver 03/09/2016  . Positive TB test 05/28/2012  . GERD (gastroesophageal reflux disease) 11/21/2010  . Depression 11/21/2010  . Dyshidrotic eczema 11/21/2010    Oretha Caprice, PT 06/24/2018, 12:48 PM  [facility] [place], [place], [address] Phone: [phone]   Fax:  [phone]  Name: Sarah Mullen MRN: [id number] Date of Birth: 07-Mar-1956

## 2018-06-25 ENCOUNTER — Ambulatory Visit (AMBULATORY_SURGERY_CENTER): Payer: Self-pay

## 2018-06-25 VITALS — Ht 61.0 in | Wt 177.6 lb

## 2018-06-25 DIAGNOSIS — Z1211 Encounter for screening for malignant neoplasm of colon: Secondary | ICD-10-CM

## 2018-06-25 MED ORDER — NA SULFATE-K SULFATE-MG SULF 17.5-3.13-1.6 GM/177ML PO SOLN: 1.0000 | Freq: Once | ORAL | 0 refills | Status: AC

## 2018-06-25 MED FILL — SUPREP BOWEL PREP KIT: 17.5-3.13-1 | 1 days supply | Qty: 354 | Fill #0

## 2018-06-25 NOTE — Progress Notes (Signed)
Per pt, no allergies to soy or egg products.Pt not taking any weight loss meds or using  O2 at home.  Pt refused emmi video. 

## 2018-06-26 ENCOUNTER — Ambulatory Visit (INDEPENDENT_AMBULATORY_CARE_PROVIDER_SITE_OTHER): Payer: No Typology Code available for payment source | Admitting: Physical Therapy

## 2018-06-26 DIAGNOSIS — M6281 Muscle weakness (generalized): Secondary | ICD-10-CM

## 2018-06-26 DIAGNOSIS — R293 Abnormal posture: Secondary | ICD-10-CM | POA: Diagnosis not present

## 2018-06-26 DIAGNOSIS — M542 Cervicalgia: Secondary | ICD-10-CM | POA: Diagnosis not present

## 2018-06-26 NOTE — Patient Instructions (Addendum)
Resisted External Rotation: in Neutral - Bilateral  PALMS UP!!! Sit or stand, tubing in both hands, elbows at sides, bent to 90, forearms forward. Pinch shoulder blades together and rotate forearms out. Keep elbows at sides. Repeat __10__ times per set. Do __2-3__ sets per session. Do __3-4__ sessions per week.  (Home) PNF: D2 Flexion - Unilateral    Opposite side toward anchor, right arm down, across body, thumb down, pull arm up and out, rotating to thumb up. Follow hand with head and eyes. Repeat _5-10___ times per set. Do __1-2__ sets per session. Do __3__ sessions per week. Use yellow band for Left, red band for Right arm.   Scapula Adduction With Pectorals, Low   Stand in doorframe with palms against frame and arms at 45. Lean forward and squeeze shoulder blades. Hold __15_ seconds. Repeat __2_ times per session. Do __2_ sessions per day.   Scapula Adduction With Pectorals, Mid-Range   Stand in doorframe with palms against frame and arms at 90. Lean forward and squeeze shoulder blades. Hold __15_ seconds. Repeat __2_ times per session. Do __2_ sessions per day. \Scapula Adduction With Pectorals, High   Stand in doorframe with palms against frame and arms at 120. Lean forward and squeeze shoulder blades. Hold _15__ seconds. Repeat _2__ times per session. Do _2__ sessions per day.  Copyright  VHI. All rights reserved.     [facility] [place], [place] [address]  [phone] (office) [phone] (fax)

## 2018-06-26 NOTE — Therapy (Signed)
[place], [place], [address] Phone: [phone]   Fax:  [phone]  Physical Therapy Treatment  Patient Details  Name: Sarah Mullen MRN: [id number] Date of Birth: 11-15-1955 Referring Provider (PT): Kary Kos, MD   Encounter Date: 06/26/2018  PT End of Session - 06/26/18 0939    Visit Number  3    Number of Visits  12    Date for PT Re-Evaluation  07/30/18    Authorization Type  [place] [employer]    PT Start Time  [phone]   pt arrived late   PT Stop Time  1022    PT Time Calculation (min)  44 min    Activity Tolerance  Patient tolerated treatment well;No increased pain    Behavior During Therapy  WFL for tasks assessed/performed       Past Medical History:  Diagnosis Date  . Allergic rhinitis, cause unspecified   . Blood transfusion    iron transfusion- [place]-2009  . Chronic headaches   . Depression   . Diverticulitis   . GERD (gastroesophageal reflux disease)   . Heart murmur    diag with echo-physiologic murmur; trace MR, trivial, early systolic MR by 95/28/41 echo ([facility])  . History of sinus tachycardia    07/04/07 Holter monitor: 69-154, average HR 95 bpm. Occ PACs, 4 runs probable atrial tachycardia  . Menometrorrhagia 11/21/2010  . Migraines   . Pre-diabetes     Past Surgical History:  Procedure Laterality Date  . ABDOMINAL HYSTERECTOMY  09/2011  . ANTERIOR CERVICAL DECOMP/DISCECTOMY FUSION N/A 03/27/2018   Procedure: Anteroir Cervical Decrompression Fusion - Cervical Three - Cervical Four - Cervical Four - Cervical Five - Cervical Five - Cervical Six;  Surgeon: Kary Kos, MD;  Location: [facility];  Service: Neurosurgery;  Laterality: N/A;  Anteroir Cervical Decrompression Fusion - Cervical Three - Cervical Four - Cervical Four - Cervical Five - Cervical Five - Cervical Six  . BREAST BIOPSY     right breast/benign  . CERCLAGE REMOVAL  1986   WHEN pregnant  .  [place]   1 time  . CYSTOSCOPY  10/17/2011   Procedure: CYSTOSCOPY;  Surgeon: Megan Salon, MD;  Location: [facility];  Service: Gynecology;;  . DILATION AND CURETTAGE OF UTERUS    . TONSILLECTOMY      There were no vitals filed for this visit.  Subjective Assessment - 06/26/18 0943    Subjective  Pt reports she is having less pain now.  She feels she is starting to get stronger too. She is anxious to return to work.     Patient Stated Goals  improve strength, return to work    Currently in Pain?  No/denies    Pain Score  0-No pain         [facility] PT Assessment - 06/26/18 0001      Assessment   Medical Diagnosis  ACDF C3-6    Referring Provider (PT)  Kary Kos, MD    Onset Date/Surgical Date  03/27/18    Hand Dominance  Right    Next MD Visit  07/11/18    Prior Therapy  intermittent; has PT for neck 2 years prior to surgery      ROM / Strength   AROM / PROM / Strength  AROM      AROM   AROM Assessment Site  Cervical    Cervical Flexion  55  Cervical Extension  43    Cervical - Right Side Bend  30    Cervical - Left Side Bend  35    Cervical - Right Rotation  67    Cervical - Left Rotation  70        [facility] Adult PT Treatment/Exercise - 06/26/18 0001      Exercises   Exercises  Shoulder;Neck      Shoulder Exercises: Supine   Other Supine Exercises  overhead pull with red band x 10;  sash with red band x 10 reps each arm;       Shoulder Exercises: Standing   External Rotation  Both;12 reps;Theraband    Theraband Level (Shoulder External Rotation)  Level 2 (Red);Level 1 (Yellow)    Internal Rotation  Left;10 reps;Theraband    Theraband Level (Shoulder Internal Rotation)  Level 3 (Green)    Flexion  Strengthening;Left;10 reps;Theraband    Theraband Level (Shoulder Flexion)  Level 1 (Yellow)    ABduction  Left;10 reps;Theraband;Weights    Theraband Level (Shoulder ABduction)  Level 1 (Yellow)    Shoulder ABduction Weight (lbs)  1,2   3 reps of each  for trial.    ABduction Limitations  was unable to complete more than 4 with red band at home; improved tolerance with yellow band     Row  Both;10 reps    Theraband Level (Shoulder Row)  Level 3 (Green)    Other Standing Exercises  overhead pull x 5 reps with yellow band; sash with yellow band x 10 rep LUE, x 10 RUE with red band.       Shoulder Exercises: Stretch   Other Shoulder Stretches  3 position doorway stretch (high level performed unillateral) x 15 sec x 2 reps each .       Neck Exercises: Stretches   Upper Trapezius Stretch  Left;Right;2 reps;20 seconds    Levator Stretch  Left;Right;2 reps;20 seconds        PT Long Term Goals - 06/18/18 1216      PT LONG TERM GOAL #1   Title  independent with HEP    Status  New    Target Date  07/30/18      PT LONG TERM GOAL #2   Title  verbalize and demonstrate proper lifting techniques to decrease risk of injury    Status  New    Target Date  07/30/18      PT LONG TERM GOAL #3   Title  demonstrate at least 4+/5 bil shoulder strength for improved strength and ability to return to work    Status  New    Target Date  07/30/18      PT LONG TERM GOAL #4   Title  FOTO score improved to </= 41% limited for improved function    Status  New    Target Date  07/30/18            Plan - 06/26/18 1139    Clinical Impression Statement  Modified HEP to accommodate for weaker LUE; pt issued yellow band for LUE exercises until she can tolerate more resistance.  She tolerated all exercises without increase in symptoms.  She demonstrated improved neck ROM this visit.  Pt is progressing towards therapy goals.     Rehab Potential  Good    PT Frequency  2x / week    PT Duration  6 weeks    PT Treatment/Interventions  ADLs/Self Care Home Management;Cryotherapy;Electrical Stimulation;Moist Heat;Therapeutic activities;Therapeutic exercise;Patient/family education;Neuromuscular re-education;Manual  techniques;Taping;Passive range of motion    PT  Next Visit Plan  continue progressive postural strengthening; manual/ IASTM to Lt upper trap and shoulder girdle.      PT Home Exercise Plan  Access Code: [email or web address].        Patient will benefit from skilled therapeutic intervention in order to improve the following deficits and impairments:  Postural dysfunction, Decreased range of motion, Decreased strength, Impaired UE functional use  Visit Diagnosis: Cervicalgia  Abnormal posture  Muscle weakness (generalized)     Problem List Patient Active Problem List   Diagnosis Date Noted  . Spinal stenosis in cervical region 03/27/2018  . Testosterone deficiency 08/22/2017  . Hyperlipidemia 08/22/2017  . Chronic lower back pain 08/22/2017  . Cervical myelopathy ([place]) 05/15/2016  . Vitamin D deficiency 05/15/2016  . Prediabetes 05/14/2016  . Fatty liver 03/09/2016  . Positive TB test 05/28/2012  . GERD (gastroesophageal reflux disease) 11/21/2010  . Depression 11/21/2010  . Dyshidrotic eczema 11/21/2010   Kerin Perna, PTA 06/26/18 11:47 AM  [facility] [place], [place], [address] Phone: [phone]   Fax:  [phone]  Name: Sarah Mullen MRN: [id number] Date of Birth: 1955-11-23

## 2018-07-01 ENCOUNTER — Ambulatory Visit (INDEPENDENT_AMBULATORY_CARE_PROVIDER_SITE_OTHER): Payer: No Typology Code available for payment source | Admitting: Physical Therapy

## 2018-07-01 DIAGNOSIS — R293 Abnormal posture: Secondary | ICD-10-CM | POA: Diagnosis not present

## 2018-07-01 DIAGNOSIS — M6281 Muscle weakness (generalized): Secondary | ICD-10-CM | POA: Diagnosis not present

## 2018-07-01 DIAGNOSIS — M542 Cervicalgia: Secondary | ICD-10-CM | POA: Diagnosis not present

## 2018-07-01 NOTE — Therapy (Signed)
[place] [address] [place], [place], [address] Phone: [phone]   Fax:  [phone]  Physical Therapy Treatment  Patient Details  Name: Sarah Mullen MRN: [id number] Date of Birth: 11/24/1955 Referring Provider (PT): Kary Kos, MD   Encounter Date: 07/01/2018  PT End of Session - 07/01/18 0943    Visit Number  4    Number of Visits  12    Date for PT Re-Evaluation  07/30/18    Authorization Type  [place] [employer]    PT Start Time  0940   Pt arrived late   PT Stop Time  1019    PT Time Calculation (min)  39 min       Past Medical History:  Diagnosis Date  . Allergic rhinitis, cause unspecified   . Blood transfusion    iron transfusion- [place]-2009  . Chronic headaches   . Depression   . Diverticulitis   . GERD (gastroesophageal reflux disease)   . Heart murmur    diag with echo-physiologic murmur; trace MR, trivial, early systolic MR by 78/24/23 echo ([facility])  . History of sinus tachycardia    07/04/07 Holter monitor: 69-154, average HR 95 bpm. Occ PACs, 4 runs probable atrial tachycardia  . Menometrorrhagia 11/21/2010  . Migraines   . Pre-diabetes     Past Surgical History:  Procedure Laterality Date  . ABDOMINAL HYSTERECTOMY  09/2011  . ANTERIOR CERVICAL DECOMP/DISCECTOMY FUSION N/A 03/27/2018   Procedure: Anteroir Cervical Decrompression Fusion - Cervical Three - Cervical Four - Cervical Four - Cervical Five - Cervical Five - Cervical Six;  Surgeon: Kary Kos, MD;  Location: [facility];  Service: Neurosurgery;  Laterality: N/A;  Anteroir Cervical Decrompression Fusion - Cervical Three - Cervical Four - Cervical Four - Cervical Five - Cervical Five - Cervical Six  . BREAST BIOPSY     right breast/benign  . CERCLAGE REMOVAL  1986   WHEN pregnant  . [place]   1 time  . CYSTOSCOPY  10/17/2011   Procedure: CYSTOSCOPY;  Surgeon: Megan Salon, MD;  Location: [facility];  Service:  Gynecology;;  . DILATION AND CURETTAGE OF UTERUS    . TONSILLECTOMY      There were no vitals filed for this visit.  Subjective Assessment - 07/01/18 0944    Subjective  Pt had some neck pain 3 days ago, up to 8/10, but she doesn't know what caused it.  She took some ibprofen last night and the  pain is now resolved. she hasn't done any exercises over last 3 days.     Currently in Pain?  No/denies    Pain Score  0-No pain         [facility] PT Assessment - 07/01/18 0001      Assessment   Medical Diagnosis  ACDF C3-6    Referring Provider (PT)  Kary Kos, MD    Onset Date/Surgical Date  03/27/18    Hand Dominance  Right    Next MD Visit  07/11/18    Prior Therapy  intermittent; has PT for neck 2 years prior to surgery       [facility] Adult PT Treatment/Exercise - 07/01/18 0001      Self-Care   Self-Care  --      Therapeutic Activites    Therapeutic Activities  Work Economist    Work Engineer, materials for assisting pt's from supine to sit, sit to stand, and  stand pivot transfer.  Educated pt on improved mechanics with LE and safe use of UE.  Pt verbalized understanding and returned demo for PTA (acting as pt).       Exercises   Exercises  Neck;Shoulder      Shoulder Exercises: Standing   External Rotation  Strengthening;Both;15 reps;Theraband    Theraband Level (Shoulder External Rotation)  Level 1 (Yellow)   cues to slow down.  3 sec pause in retraction   Flexion  Strengthening;Left;10 reps;Theraband   mirror for feedback. cues to slow down, not lock elbow.    Theraband Level (Shoulder Flexion)  Level 1 (Yellow)   to 90 deg   ABduction  Left;10 reps;Theraband;Weights    Theraband Level (Shoulder ABduction)  Level 1 (Yellow)   to 90 deg   Row  Both;10 reps;Theraband    Theraband Level (Shoulder Row)  Level 4 (Blue)    Other Standing Exercises  sash with yellow band x 10 reps with RUE, x 3 x 3 sets, (fatigues quickly).  Overhead pull with yellow band bilat x  10 reps     Other Standing Exercises  Holding band with bilat hands and side stepping away from door (simulating pulling pt in bed with drawsheet) x 10 Rt/Lt with green, repeated with blue.         Shoulder Exercises: ROM/Strengthening   UBE (Upper Arm Bike)  L1: 1.5 min each direction, standing.       Shoulder Exercises: Stretch   Other Shoulder Stretches  3 position doorway stretch (high level performed unillateral) x 15 sec x 2 reps each .                   PT Long Term Goals - 06/18/18 1216      PT LONG TERM GOAL #1   Title  independent with HEP    Status  New    Target Date  07/30/18      PT LONG TERM GOAL #2   Title  verbalize and demonstrate proper lifting techniques to decrease risk of injury    Status  New    Target Date  07/30/18      PT LONG TERM GOAL #3   Title  demonstrate at least 4+/5 bil shoulder strength for improved strength and ability to return to work    Status  New    Target Date  07/30/18      PT LONG TERM GOAL #4   Title  FOTO score improved to </= 41% limited for improved function    Status  New    Target Date  07/30/18            Plan - 07/01/18 1323    Clinical Impression Statement  Pt tolerated all exercises, including work simulated exercises, well and without increase in symptoms.  Pt will benefit from continued PT intervention to maximize function and assist in return to work.     Rehab Potential  Good    PT Frequency  2x / week    PT Duration  6 weeks    PT Treatment/Interventions  ADLs/Self Care Home Management;Cryotherapy;Electrical Stimulation;Moist Heat;Therapeutic activities;Therapeutic exercise;Patient/family education;Neuromuscular re-education;Manual techniques;Taping;Passive range of motion    PT Next Visit Plan  continue progressive postural strengthening; manual/ IASTM to Lt upper trap and shoulder girdle.      PT Home Exercise Plan  Access Code: [email or web address].     Consulted and Agree with Plan of Care  Patient  Patient will benefit from skilled therapeutic intervention in order to improve the following deficits and impairments:  Postural dysfunction, Decreased range of motion, Decreased strength, Impaired UE functional use  Visit Diagnosis: Cervicalgia  Abnormal posture  Muscle weakness (generalized)     Problem List Patient Active Problem List   Diagnosis Date Noted  . Spinal stenosis in cervical region 03/27/2018  . Testosterone deficiency 08/22/2017  . Hyperlipidemia 08/22/2017  . Chronic lower back pain 08/22/2017  . Cervical myelopathy ([place]) 05/15/2016  . Vitamin D deficiency 05/15/2016  . Prediabetes 05/14/2016  . Fatty liver 03/09/2016  . Positive TB test 05/28/2012  . GERD (gastroesophageal reflux disease) 11/21/2010  . Depression 11/21/2010  . Dyshidrotic eczema 11/21/2010   Kerin Perna, PTA 07/01/18 2:18 PM  [place], [place], [address] Phone: [phone]   Fax:  [phone]  Name: Sarah Mullen MRN: [id number] Date of Birth: June 14, 1956

## 2018-07-02 ENCOUNTER — Encounter: Payer: Self-pay | Admitting: Gastroenterology

## 2018-07-03 ENCOUNTER — Ambulatory Visit (INDEPENDENT_AMBULATORY_CARE_PROVIDER_SITE_OTHER): Payer: No Typology Code available for payment source | Admitting: Physical Therapy

## 2018-07-03 ENCOUNTER — Encounter: Payer: Self-pay | Admitting: Physical Therapy

## 2018-07-03 DIAGNOSIS — M6281 Muscle weakness (generalized): Secondary | ICD-10-CM

## 2018-07-03 DIAGNOSIS — R293 Abnormal posture: Secondary | ICD-10-CM | POA: Diagnosis not present

## 2018-07-03 DIAGNOSIS — M542 Cervicalgia: Secondary | ICD-10-CM

## 2018-07-03 NOTE — Therapy (Signed)
[place], [place], [address] Phone: [phone]   Fax:  [phone]  Physical Therapy Treatment  Patient Details  Name: Sarah Mullen MRN: [id number] Date of Birth: 09/23/1955 Referring Provider (PT): Kary Kos, MD   Encounter Date: 07/03/2018  PT End of Session - 07/03/18 1037    Visit Number  5    Number of Visits  12    Date for PT Re-Evaluation  07/30/18    Authorization Type  [place] [employer]    PT Start Time  7341   pt arrived late   PT Stop Time  1012    PT Time Calculation (min)  32 min    Activity Tolerance  Patient tolerated treatment well;No increased pain    Behavior During Therapy  WFL for tasks assessed/performed       Past Medical History:  Diagnosis Date  . Allergic rhinitis, cause unspecified   . Blood transfusion    iron transfusion- [place]-2009  . Chronic headaches   . Depression   . Diverticulitis   . GERD (gastroesophageal reflux disease)   . Heart murmur    diag with echo-physiologic murmur; trace MR, trivial, early systolic MR by 93/79/02 echo ([facility])  . History of sinus tachycardia    07/04/07 Holter monitor: 69-154, average HR 95 bpm. Occ PACs, 4 runs probable atrial tachycardia  . Menometrorrhagia 11/21/2010  . Migraines   . Pre-diabetes     Past Surgical History:  Procedure Laterality Date  . ABDOMINAL HYSTERECTOMY  09/2011  . ANTERIOR CERVICAL DECOMP/DISCECTOMY FUSION N/A 03/27/2018   Procedure: Anteroir Cervical Decrompression Fusion - Cervical Three - Cervical Four - Cervical Four - Cervical Five - Cervical Five - Cervical Six;  Surgeon: Kary Kos, MD;  Location: [facility];  Service: Neurosurgery;  Laterality: N/A;  Anteroir Cervical Decrompression Fusion - Cervical Three - Cervical Four - Cervical Four - Cervical Five - Cervical Five - Cervical Six  . BREAST BIOPSY     right breast/benign  . CERCLAGE REMOVAL  1986   WHEN pregnant  .  [place]   1 time  . CYSTOSCOPY  10/17/2011   Procedure: CYSTOSCOPY;  Surgeon: Megan Salon, MD;  Location: [facility];  Service: Gynecology;;  . DILATION AND CURETTAGE OF UTERUS    . TONSILLECTOMY      There were no vitals filed for this visit.  Subjective Assessment - 07/03/18 1036    Subjective  doing well, sees MD next Thursday 12/19.    Patient Stated Goals  improve strength, return to work    Currently in Pain?  No/denies                       [facility] Adult PT Treatment/Exercise - 07/03/18 0959      Self-Care   Self-Care  Other Self-Care Comments    Other Self-Care Comments   discussed current progress and plan going forward.  plan to see what MD says next week to determine continuation or d/c once pt returns to work.      Therapeutic Activites    Therapeutic Activities  Work Insurance account manager  practiced work simulated activities including 2 person assist with bed mobility, rolling and sit to stands with simulated patient.  Pt needed min cues to decrease shoulder shrug.      Shoulder Exercises: Standing   Horizontal ABduction  Both;15 reps;Theraband  Theraband Level (Shoulder Horizontal ABduction)  Level 1 (Yellow)   cues to slow down with 3 sec hold   External Rotation  Strengthening;Both;15 reps;Theraband    Theraband Level (Shoulder External Rotation)  Level 1 (Yellow)   cues to slow down.  3 sec pause in retraction   Flexion  Strengthening;Both;20 reps;Weights    Shoulder Flexion Weight (lbs)  2    ABduction  Both;10 reps;Weights    Shoulder ABduction Weight (lbs)  2,1; scaption; cues to decrease shoulder shrug    Row  Both;Theraband;20 reps    Theraband Level (Shoulder Row)  Level 3 (Green)    Other Standing Exercises  overhead pull with yellow theraband x 10 reps      Shoulder Exercises: ROM/Strengthening   UBE (Upper Arm Bike)  L2: 1.5 min each direction, standing.       Shoulder Exercises: Stretch   Other Shoulder  Stretches  3 position doorway stretch (high level performed unillateral) x 15 sec x 2 reps each .              PT Education - 07/03/18 1037    Education Details  see self care    Person(s) Educated  Patient    Methods  Explanation    Comprehension  Verbalized understanding          PT Long Term Goals - 06/18/18 1216      PT LONG TERM GOAL #1   Title  independent with HEP    Status  New    Target Date  07/30/18      PT LONG TERM GOAL #2   Title  verbalize and demonstrate proper lifting techniques to decrease risk of injury    Status  New    Target Date  07/30/18      PT LONG TERM GOAL #3   Title  demonstrate at least 4+/5 bil shoulder strength for improved strength and ability to return to work    Status  New    Target Date  07/30/18      PT LONG TERM GOAL #4   Title  FOTO score improved to </= 41% limited for improved function    Status  New    Target Date  07/30/18            Plan - 07/03/18 1037    Clinical Impression Statement  Pt tolerated session well and able to perform work simulated bed mobility without pain.  Pt still needs some cues with exercises to decrease Lt shoulder shrug. Pt to see MD next week and is hopeful for return to work after [employer].  Discussed options for PT including continuation or d/c.      Rehab Potential  Good    PT Frequency  2x / week    PT Duration  6 weeks    PT Treatment/Interventions  ADLs/Self Care Home Management;Cryotherapy;Electrical Stimulation;Moist Heat;Therapeutic activities;Therapeutic exercise;Patient/family education;Neuromuscular re-education;Manual techniques;Taping;Passive range of motion    PT Next Visit Plan  continue progressive postural strengthening; manual/ IASTM to Lt upper trap and shoulder girdle.  look at goals and send MD note.      PT Home Exercise Plan  Access Code: [email or web address].     Consulted and Agree with Plan of Care  Patient       Patient will benefit from skilled therapeutic intervention  in order to improve the following deficits and impairments:  Postural dysfunction, Decreased range of motion, Decreased strength, Impaired UE functional use  Visit Diagnosis: Cervicalgia  Abnormal posture  Muscle weakness (generalized)     Problem List Patient Active Problem List   Diagnosis Date Noted  . Spinal stenosis in cervical region 03/27/2018  . Testosterone deficiency 08/22/2017  . Hyperlipidemia 08/22/2017  . Chronic lower back pain 08/22/2017  . Cervical myelopathy ([place]) 05/15/2016  . Vitamin D deficiency 05/15/2016  . Prediabetes 05/14/2016  . Fatty liver 03/09/2016  . Positive TB test 05/28/2012  . GERD (gastroesophageal reflux disease) 11/21/2010  . Depression 11/21/2010  . Dyshidrotic eczema 11/21/2010       Laureen Abrahams, PT, DPT 07/03/18 10:40 AM      [facility] [place], [place], [address] Phone: [phone]   Fax:  [phone]  Name: Sarah Mullen MRN: [id number] Date of Birth: 03-Aug-1955

## 2018-07-08 ENCOUNTER — Ambulatory Visit (INDEPENDENT_AMBULATORY_CARE_PROVIDER_SITE_OTHER): Payer: No Typology Code available for payment source | Admitting: Physical Therapy

## 2018-07-08 DIAGNOSIS — R293 Abnormal posture: Secondary | ICD-10-CM | POA: Diagnosis not present

## 2018-07-08 DIAGNOSIS — M6281 Muscle weakness (generalized): Secondary | ICD-10-CM

## 2018-07-08 DIAGNOSIS — M542 Cervicalgia: Secondary | ICD-10-CM | POA: Diagnosis not present

## 2018-07-08 NOTE — Therapy (Addendum)
[place], [place], [address] Phone: [phone]   Fax:  [phone]  Physical Therapy Treatment  Patient Details  Name: Sarah Mullen MRN: [id number] Date of Birth: 01/18/1956 Referring Provider (PT): Kary Kos, MD   Encounter Date: 07/08/2018  PT End of Session - 07/08/18 0853    Visit Number  6    Number of Visits  12    Date for PT Re-Evaluation  07/30/18    Authorization Type  [place] [employer]    PT Start Time  [phone]   pt arrived late   PT Stop Time  0930    PT Time Calculation (min)  38 min       Past Medical History:  Diagnosis Date  . Allergic rhinitis, cause unspecified   . Blood transfusion    iron transfusion- [place]-2009  . Chronic headaches   . Depression   . Diverticulitis   . GERD (gastroesophageal reflux disease)   . Heart murmur    diag with echo-physiologic murmur; trace MR, trivial, early systolic MR by 25/05/39 echo ([facility])  . History of sinus tachycardia    07/04/07 Holter monitor: 69-154, average HR 95 bpm. Occ PACs, 4 runs probable atrial tachycardia  . Menometrorrhagia 11/21/2010  . Migraines   . Pre-diabetes     Past Surgical History:  Procedure Laterality Date  . ABDOMINAL HYSTERECTOMY  09/2011  . ANTERIOR CERVICAL DECOMP/DISCECTOMY FUSION N/A 03/27/2018   Procedure: Anteroir Cervical Decrompression Fusion - Cervical Three - Cervical Four - Cervical Four - Cervical Five - Cervical Five - Cervical Six;  Surgeon: Kary Kos, MD;  Location: [facility];  Service: Neurosurgery;  Laterality: N/A;  Anteroir Cervical Decrompression Fusion - Cervical Three - Cervical Four - Cervical Four - Cervical Five - Cervical Five - Cervical Six  . BREAST BIOPSY     right breast/benign  . CERCLAGE REMOVAL  1986   WHEN pregnant  . [place]   1 time  . CYSTOSCOPY  10/17/2011   Procedure: CYSTOSCOPY;  Surgeon: Megan Salon, MD;  Location: [facility];   Service: Gynecology;;  . DILATION AND CURETTAGE OF UTERUS    . TONSILLECTOMY      There were no vitals filed for this visit.  Subjective Assessment - 07/08/18 0854    Subjective  "I don't have pain, I'm just sore".   Pt reports she has been doing more and she thinks this is the cause of her soreness.  She is hopeful to return to work after [employer] without restrictions.    Patient Stated Goals  improve strength, return to work    Currently in Pain?  No/denies         [facility] PT Assessment - 07/08/18 0001      Assessment   Medical Diagnosis  ACDF C3-6    Referring Provider (PT)  Kary Kos, MD    Onset Date/Surgical Date  03/27/18    Hand Dominance  Right    Next MD Visit  07/11/18    Prior Therapy  intermittent; has PT for neck 2 years prior to surgery      Observation/Other Assessments   Focus on Therapeutic Outcomes (FOTO)   65% (35% limited)      AROM   AROM Assessment Site  --      Strength   Right/Left Shoulder  Right;Left    Right Shoulder Flexion  --   5-/5   Right  Shoulder Extension  5/5    Right Shoulder ABduction  --   5-/5   Right Shoulder Internal Rotation  5/5    Right Shoulder External Rotation  --   5-/5   Left Shoulder Flexion  4+/5    Left Shoulder Extension  5/5    Left Shoulder ABduction  4+/5    Left Shoulder Internal Rotation  --   5-/5   Left Shoulder External Rotation  3+/5        [facility] Adult PT Treatment/Exercise - 07/08/18 0001      Shoulder Exercises: Supine   Other Supine Exercises  overhead pull with yellow band x 10;  sash with red band x 10 reps each arm;  bilat shoulder ER with yellow band x 10 reps      Shoulder Exercises: Seated   External Rotation  Strengthening;Both;10 reps;Theraband    Theraband Level (Shoulder External Rotation)  Level 1 (Yellow)    External Rotation Limitations  diffiulty keeping Lt elbow tucked at side, switched to supine with improved form      Shoulder Exercises: Standing   Extension   Strengthening;Both;10 reps;Theraband    Theraband Level (Shoulder Extension)  Level 1 (Yellow)    Row  Both;Theraband;20 reps    Theraband Level (Shoulder Row)  Level 3 (Green)    Other Standing Exercises  lat stretch with hands on stair railing x 20 sec x 2 reps       Shoulder Exercises: ROM/Strengthening   UBE (Upper Arm Bike)  L2: 1.5 min each direction, standing.       Shoulder Exercises: Stretch   Other Shoulder Stretches  3 position doorway stretch (high level performed unillateral) x 30 sec x 2 reps each .       Manual Therapy   Manual Therapy  Soft tissue mobilization    Soft tissue mobilization  IASTM with Hana Emi tool to bilat upper trap, scalenes, and cervical paraspinals to decrease fascial tightness and improve ROM      Neck Exercises: Stretches   Upper Trapezius Stretch  Left;Right;2 reps;20 seconds        PT Long Term Goals - 07/08/18 0909      PT LONG TERM GOAL #1   Title  independent with HEP    Status  On-going      PT LONG TERM GOAL #2   Title  verbalize and demonstrate proper lifting techniques to decrease risk of injury    Status  Partially Met      PT LONG TERM GOAL #3   Title  demonstrate at least 4+/5 bil shoulder strength for improved strength and ability to return to work    Status  Partially Met      PT LONG TERM GOAL #4   Title  FOTO score improved to </= 41% limited for improved function    Status  Achieved            Plan - 07/08/18 0910    Clinical Impression Statement  Pt demonstrated improved shoulder strength; continues to have limited ER and flexion strength.  Pt tolerated all exercises well, without increase in symptoms.  Pt given cues to assist in improved body mechanics during work simulation acitivities in past sessions.  She has partially met her goals and is making good progress towards meeting remaining. Pt verbalized desire to hold therapy until she sees MD.      Rehab Potential  Good    PT Frequency  2x / week  PT  Duration  6 weeks    PT Treatment/Interventions  ADLs/Self Care Home Management;Cryotherapy;Electrical Stimulation;Moist Heat;Therapeutic activities;Therapeutic exercise;Patient/family education;Neuromuscular re-education;Manual techniques;Taping;Passive range of motion    PT Next Visit Plan  Hold therapy until MD appt; will await further instruction.     PT Home Exercise Plan  Access Code: [email or web address].     Consulted and Agree with Plan of Care  Patient       Patient will benefit from skilled therapeutic intervention in order to improve the following deficits and impairments:  Postural dysfunction, Decreased range of motion, Decreased strength, Impaired UE functional use  Visit Diagnosis: Cervicalgia  Abnormal posture  Muscle weakness (generalized)     Problem List Patient Active Problem List   Diagnosis Date Noted  . Spinal stenosis in cervical region 03/27/2018  . Testosterone deficiency 08/22/2017  . Hyperlipidemia 08/22/2017  . Chronic lower back pain 08/22/2017  . Cervical myelopathy ([place]) 05/15/2016  . Vitamin D deficiency 05/15/2016  . Prediabetes 05/14/2016  . Fatty liver 03/09/2016  . Positive TB test 05/28/2012  . GERD (gastroesophageal reflux disease) 11/21/2010  . Depression 11/21/2010  . Dyshidrotic eczema 11/21/2010   Kerin Perna, PTA 07/08/18 10:12 AM  [place], [place], [address] Phone: [phone]   Fax:  [phone]  Name: Sarah Mullen MRN: [id number] Date of Birth: 1956-06-13

## 2018-07-11 ENCOUNTER — Ambulatory Visit (INDEPENDENT_AMBULATORY_CARE_PROVIDER_SITE_OTHER): Payer: No Typology Code available for payment source | Admitting: Physical Therapy

## 2018-07-11 ENCOUNTER — Encounter: Payer: No Typology Code available for payment source | Admitting: Physical Therapy

## 2018-07-11 DIAGNOSIS — M6281 Muscle weakness (generalized): Secondary | ICD-10-CM | POA: Diagnosis not present

## 2018-07-11 DIAGNOSIS — R293 Abnormal posture: Secondary | ICD-10-CM

## 2018-07-11 DIAGNOSIS — M542 Cervicalgia: Secondary | ICD-10-CM | POA: Diagnosis not present

## 2018-07-11 NOTE — Therapy (Signed)
[place], [place], [address] Phone: [phone]   Fax:  [phone]  Physical Therapy Treatment  Patient Details  Name: Sarah Mullen MRN: [id number] Date of Birth: September 29, 1955 Referring Provider (PT): Kary Kos, MD   Encounter Date: 07/11/2018  PT End of Session - 07/11/18 1417    Visit Number  7    Number of Visits  12    Date for PT Re-Evaluation  07/30/18    Authorization Type  [place] [employer]    PT Start Time  4944   pt arrived late   PT Stop Time  1446    PT Time Calculation (min)  31 min    Activity Tolerance  Patient tolerated treatment well;No increased pain       Past Medical History:  Diagnosis Date  . Allergic rhinitis, cause unspecified   . Blood transfusion    iron transfusion- [place]-2009  . Chronic headaches   . Depression   . Diverticulitis   . GERD (gastroesophageal reflux disease)   . Heart murmur    diag with echo-physiologic murmur; trace MR, trivial, early systolic MR by 96/75/91 echo ([facility])  . History of sinus tachycardia    07/04/07 Holter monitor: 69-154, average HR 95 bpm. Occ PACs, 4 runs probable atrial tachycardia  . Menometrorrhagia 11/21/2010  . Migraines   . Pre-diabetes     Past Surgical History:  Procedure Laterality Date  . ABDOMINAL HYSTERECTOMY  09/2011  . ANTERIOR CERVICAL DECOMP/DISCECTOMY FUSION N/A 03/27/2018   Procedure: Anteroir Cervical Decrompression Fusion - Cervical Three - Cervical Four - Cervical Four - Cervical Five - Cervical Five - Cervical Six;  Surgeon: Kary Kos, MD;  Location: [facility];  Service: Neurosurgery;  Laterality: N/A;  Anteroir Cervical Decrompression Fusion - Cervical Three - Cervical Four - Cervical Four - Cervical Five - Cervical Five - Cervical Six  . BREAST BIOPSY     right breast/benign  . CERCLAGE REMOVAL  1986   WHEN pregnant  . [place]   1 time  . CYSTOSCOPY  10/17/2011   Procedure: CYSTOSCOPY;  Surgeon: Megan Salon, MD;  Location: [facility];  Service: Gynecology;;  . DILATION AND CURETTAGE OF UTERUS    . TONSILLECTOMY      There were no vitals filed for this visit.  Subjective Assessment - 07/11/18 1419    Subjective  Pt reports she visited with surgeon; he gave 6 more weeks of therapy to help her strengthen her neck/UE.  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  She is interested in continuation of therapy, with possible decrease to 1x/wk.     Patient Stated Goals  improve strength, return to work    Currently in Pain?  No/denies    Pain Score  0-No pain         [facility] PT Assessment - 07/11/18 0001      Assessment   Medical Diagnosis  ACDF C3-6    Referring Provider (PT)  Kary Kos, MD    Onset Date/Surgical Date  03/27/18    Hand Dominance  Right    Next MD Visit  07/11/18    Prior Therapy  intermittent; has PT for neck 2 years prior to surgery       [facility] Adult PT Treatment/Exercise - 07/11/18 0001      Shoulder Exercises: Standing   External Rotation Limitations  Lt shoulder ER isometric with elbow at  side, stepping out and holding 3-5 seconds.     Extension  Both;10 reps;Theraband    Theraband Level (Shoulder Extension)  Level 2 (Red)    Other Standing Exercises  Holding band with bilat hands and side stepping away from door (simulating pulling pt in bed with drawsheet) x 10 Rt/Lt with blue.         Shoulder Exercises: ROM/Strengthening   UBE (Upper Arm Bike)  L2: 1.5 min each direction, standing.     Wall Pushups  10 reps   and reverse wall push ups x 10     Shoulder Exercises: Stretch   Other Shoulder Stretches  3 position doorway stretch (high level performed unillateral) x 15 sec x 2 reps each .       Manual Therapy   Soft tissue mobilization  IASTM with Hana Emi tool to bilat upper trap, scalenes, and cervical paraspinals to decrease fascial tightness and improve ROM      Neck Exercises: Stretches   Upper  Trapezius Stretch  Left;Right;2 reps;20 seconds        PT Long Term Goals - 07/08/18 0909      PT LONG TERM GOAL #1   Title  independent with HEP    Status  On-going      PT LONG TERM GOAL #2   Title  verbalize and demonstrate proper lifting techniques to decrease risk of injury    Status  Partially Met      PT LONG TERM GOAL #3   Title  demonstrate at least 4+/5 bil shoulder strength for improved strength and ability to return to work    Status  Partially Met      PT LONG TERM GOAL #4   Title  FOTO score improved to </= 41% limited for improved function    Status  Achieved            Plan - 07/11/18 1704    Clinical Impression Statement  Pt tolerated all new exercises well, without increase in pain.  She reported fatigue with Lt shoulder ER exercises.  Pt continues to make progress towards goals and will benefit from continued PT intervention to assist with return to work activities.      Rehab Potential  Good    PT Frequency  2x / week    PT Duration  6 weeks    PT Treatment/Interventions  ADLs/Self Care Home Management;Cryotherapy;Electrical Stimulation;Moist Heat;Therapeutic activities;Therapeutic exercise;Patient/family education;Neuromuscular re-education;Manual techniques;Taping;Passive range of motion    PT Next Visit Plan  continue progressive UE and postural strengthening.  update HEP as needed.     PT Home Exercise Plan  Access Code: [email or web address].     Consulted and Agree with Plan of Care  Patient       Patient will benefit from skilled therapeutic intervention in order to improve the following deficits and impairments:  Postural dysfunction, Decreased range of motion, Decreased strength, Impaired UE functional use  Visit Diagnosis: Cervicalgia  Abnormal posture  Muscle weakness (generalized)     Problem List Patient Active Problem List   Diagnosis Date Noted  . Spinal stenosis in cervical region 03/27/2018  . Testosterone deficiency 08/22/2017  .  Hyperlipidemia 08/22/2017  . Chronic lower back pain 08/22/2017  . Cervical myelopathy ([place]) 05/15/2016  . Vitamin D deficiency 05/15/2016  . Prediabetes 05/14/2016  . Fatty liver 03/09/2016  . Positive TB test 05/28/2012  . GERD (gastroesophageal reflux disease) 11/21/2010  . Depression 11/21/2010  . Dyshidrotic eczema 11/21/2010   Anderson Malta  Lorina Rabon, PTA 07/11/18 5:06 PM  [place], [place], [address] Phone: [phone]   Fax:  [phone]  Name: Sarah Mullen MRN: [id number] Date of Birth: 10/15/1955

## 2018-07-15 ENCOUNTER — Encounter: Payer: Self-pay | Admitting: Physical Therapy

## 2018-07-15 ENCOUNTER — Ambulatory Visit (INDEPENDENT_AMBULATORY_CARE_PROVIDER_SITE_OTHER): Payer: No Typology Code available for payment source | Admitting: Physical Therapy

## 2018-07-15 DIAGNOSIS — M6281 Muscle weakness (generalized): Secondary | ICD-10-CM | POA: Diagnosis not present

## 2018-07-15 DIAGNOSIS — R293 Abnormal posture: Secondary | ICD-10-CM | POA: Diagnosis not present

## 2018-07-15 DIAGNOSIS — M542 Cervicalgia: Secondary | ICD-10-CM

## 2018-07-15 NOTE — Therapy (Signed)
[place], [place], [address] Phone: [phone]   Fax:  [phone]  Physical Therapy Treatment  Patient Details  Name: Sarah Mullen MRN: [id number] Date of Birth: 10/17/1955 Referring Provider (PT): Kary Kos, MD   Encounter Date: 07/15/2018  PT End of Session - 07/15/18 0758    Visit Number  8    Number of Visits  12    Date for PT Re-Evaluation  07/30/18    Authorization Type  [place] [employer]    PT Start Time  0720    PT Stop Time  0758    PT Time Calculation (min)  38 min    Activity Tolerance  Patient tolerated treatment well;No increased pain       Past Medical History:  Diagnosis Date  . Allergic rhinitis, cause unspecified   . Blood transfusion    iron transfusion- [place]-2009  . Chronic headaches   . Depression   . Diverticulitis   . GERD (gastroesophageal reflux disease)   . Heart murmur    diag with echo-physiologic murmur; trace MR, trivial, early systolic MR by 44/03/47 echo ([facility])  . History of sinus tachycardia    07/04/07 Holter monitor: 69-154, average HR 95 bpm. Occ PACs, 4 runs probable atrial tachycardia  . Menometrorrhagia 11/21/2010  . Migraines   . Pre-diabetes     Past Surgical History:  Procedure Laterality Date  . ABDOMINAL HYSTERECTOMY  09/2011  . ANTERIOR CERVICAL DECOMP/DISCECTOMY FUSION N/A 03/27/2018   Procedure: Anteroir Cervical Decrompression Fusion - Cervical Three - Cervical Four - Cervical Four - Cervical Five - Cervical Five - Cervical Six;  Surgeon: Kary Kos, MD;  Location: [facility];  Service: Neurosurgery;  Laterality: N/A;  Anteroir Cervical Decrompression Fusion - Cervical Three - Cervical Four - Cervical Four - Cervical Five - Cervical Five - Cervical Six  . BREAST BIOPSY     right breast/benign  . CERCLAGE REMOVAL  1986   WHEN pregnant  . [place]   1 time  . CYSTOSCOPY  10/17/2011   Procedure: CYSTOSCOPY;   Surgeon: Megan Salon, MD;  Location: [facility];  Service: Gynecology;;  . DILATION AND CURETTAGE OF UTERUS    . TONSILLECTOMY      There were no vitals filed for this visit.  Subjective Assessment - 07/15/18 0721    Subjective  doing well; no pain.    Patient Stated Goals  improve strength, return to work    Currently in Pain?  No/denies                       [facility] Adult PT Treatment/Exercise - 07/15/18 0722      Neck Exercises: Machines for Strengthening   UBE (Upper Arm Bike)  L2 x 4 min (2' each direction)      Neck Exercises: Supine   Other Supine Exercise  pelvic tilts 10 reps x 5 sec, ab set with alternating clam x 10 bil      Shoulder Exercises: Standing   ABduction  Both;20 reps;Weights    Shoulder ABduction Weight (lbs)  2    Extension  Both;15 reps;Theraband    Theraband Level (Shoulder Extension)  Level 2 (Red)    Row  Both;15 reps;Theraband   5-10 sec hold   Theraband Level (Shoulder Row)  Level 4 (Blue)    Other Standing Exercises  two hand walk out with black theraband 2x20  Other Standing Exercises  scaption 2# 2x10;       Shoulder Exercises: Stretch   Other Shoulder Stretches  3 position doorway stretch (high level performed unillateral) x 15 sec x 2 reps each .                   PT Long Term Goals - 07/08/18 0909      PT LONG TERM GOAL #1   Title  independent with HEP    Status  On-going      PT LONG TERM GOAL #2   Title  verbalize and demonstrate proper lifting techniques to decrease risk of injury    Status  Partially Met      PT LONG TERM GOAL #3   Title  demonstrate at least 4+/5 bil shoulder strength for improved strength and ability to return to work    Status  Partially Met      PT LONG TERM GOAL #4   Title  FOTO score improved to </= 41% limited for improved function    Status  Achieved            Plan - 07/15/18 0758    Clinical Impression Statement  Pt tolerated strengthening exercises well today  and incorporated core strengthening to help with work responsibilities.  Will continue to benefit from PT to maximize function.  Plans to decr frequency to 1x/wk once returning to work.    Rehab Potential  Good    PT Frequency  2x / week    PT Duration  6 weeks    PT Treatment/Interventions  ADLs/Self Care Home Management;Cryotherapy;Electrical Stimulation;Moist Heat;Therapeutic activities;Therapeutic exercise;Patient/family education;Neuromuscular re-education;Manual techniques;Taping;Passive range of motion    PT Next Visit Plan  continue progressive UE and postural strengthening.  update HEP as needed.     PT Home Exercise Plan  Access Code: [email or web address].     Consulted and Agree with Plan of Care  Patient       Patient will benefit from skilled therapeutic intervention in order to improve the following deficits and impairments:  Postural dysfunction, Decreased range of motion, Decreased strength, Impaired UE functional use  Visit Diagnosis: Cervicalgia  Abnormal posture  Muscle weakness (generalized)     Problem List Patient Active Problem List   Diagnosis Date Noted  . Spinal stenosis in cervical region 03/27/2018  . Testosterone deficiency 08/22/2017  . Hyperlipidemia 08/22/2017  . Chronic lower back pain 08/22/2017  . Cervical myelopathy ([place]) 05/15/2016  . Vitamin D deficiency 05/15/2016  . Prediabetes 05/14/2016  . Fatty liver 03/09/2016  . Positive TB test 05/28/2012  . GERD (gastroesophageal reflux disease) 11/21/2010  . Depression 11/21/2010  . Dyshidrotic eczema 11/21/2010     Laureen Abrahams, PT, DPT 07/15/18 8:01 AM     [facility] [place], [place], [address] Phone: [phone]   Fax:  [phone]  Name: Sarah Mullen MRN: [id number] Date of Birth: 1955/12/29

## 2018-07-16 ENCOUNTER — Ambulatory Visit (AMBULATORY_SURGERY_CENTER): Payer: No Typology Code available for payment source | Admitting: Gastroenterology

## 2018-07-16 ENCOUNTER — Encounter: Payer: Self-pay | Admitting: Gastroenterology

## 2018-07-16 VITALS — BP 98/60 | HR 87 | Temp 98.2°F | Resp 15 | Ht 61.0 in | Wt 177.0 lb

## 2018-07-16 DIAGNOSIS — Z1211 Encounter for screening for malignant neoplasm of colon: Secondary | ICD-10-CM

## 2018-07-16 MED ORDER — SODIUM CHLORIDE 0.9 % IV SOLN: 500.0000 mL | Freq: Once | INTRAVENOUS | Status: DC

## 2018-07-16 NOTE — Progress Notes (Signed)
Pt's states no medical or surgical changes since previsit or office visit. 

## 2018-07-16 NOTE — Progress Notes (Signed)
To PACU< VSS. Report to [email or web address] 

## 2018-07-16 NOTE — Op Note (Signed)
[place] Patient Name: Sarah Mullen Procedure Date: 07/16/2018 10:14 AM MRN: [id number] Endoscopist: Ladene Artist , MD Age: 62 Referring MD:  Date of Birth: [birth year] Gender: Female Account #: [id number] Procedure:                Colonoscopy Indications:              Screening for colorectal malignant neoplasm Medicines:                Monitored Anesthesia Care Procedure:                Pre-Anesthesia Assessment:                           - Prior to the procedure, a History and Physical                            was performed, and patient medications and                            allergies were reviewed. The patient's tolerance of                            previous anesthesia was also reviewed. The risks                            and benefits of the procedure and the sedation                            options and risks were discussed with the patient.                            All questions were answered, and informed consent                            was obtained. Prior Anticoagulants: The patient has                            taken no previous anticoagulant or antiplatelet                            agents. ASA Grade Assessment: II - A patient with                            mild systemic disease. After reviewing the risks                            and benefits, the patient was deemed in                            satisfactory condition to undergo the procedure.                           After obtaining informed consent, the colonoscope  was passed under direct vision. Throughout the                            procedure, the patient's blood pressure, pulse, and                            oxygen saturations were monitored continuously. The                            Colonoscope was introduced through the anus and                            advanced to the the cecum, identified by                            appendiceal orifice and  ileocecal valve. The                            ileocecal valve, appendiceal orifice, and rectum                            were photographed. The quality of the bowel                            preparation was excellent. The colonoscopy was                            performed without difficulty. The patient tolerated                            the procedure well. Scope In: 10:27:02 AM Scope Out: 10:38:38 AM Scope Withdrawal Time: 0 hours 8 minutes 10 seconds  Total Procedure Duration: 0 hours 11 minutes 36 seconds  Findings:                 The perianal and digital rectal examinations were                            normal.                           A few small-mouthed diverticula were found in the                            left colon.                           The exam was otherwise without abnormality on                            direct and retroflexion views. Complications:            No immediate complications. Estimated blood loss:                            None. Estimated Blood Loss:     Estimated  blood loss: none. Impression:               - Mild left colon diverticulosis                           - Otherwise normal exam                           - No specimens collected. Recommendation:           - Repeat colonoscopy in 10 years for screening                            purposes.                           - Patient has a contact number available for                            emergencies. The signs and symptoms of potential                            delayed complications were discussed with the                            patient. Return to normal activities tomorrow.                            Written discharge instructions were provided to the                            patient.                           - Resume previous diet.                           - Continue present medications. Ladene Artist, MD 07/16/2018 10:42:41 AM This report has been signed  electronically.

## 2018-07-16 NOTE — Patient Instructions (Signed)
**   Handout given on diverticulosis **   YOU HAD AN ENDOSCOPIC PROCEDURE TODAY AT THE [facility]:   Refer to the procedure report that was given to you for any specific questions about what was found during the examination.  If the procedure report does not answer your questions, please call your gastroenterologist to clarify.  If you requested that your care partner not be given the details of your procedure findings, then the procedure report has been included in a sealed envelope for you to review at your convenience later.  YOU SHOULD EXPECT: Some feelings of bloating in the abdomen. Passage of more gas than usual.  Walking can help get rid of the air that was put into your GI tract during the procedure and reduce the bloating. If you had a lower endoscopy (such as a colonoscopy or flexible sigmoidoscopy) you may notice spotting of blood in your stool or on the toilet paper. If you underwent a bowel prep for your procedure, you may not have a normal bowel movement for a few days.  Please Note:  You might notice some irritation and congestion in your nose or some drainage.  This is from the oxygen used during your procedure.  There is no need for concern and it should clear up in a day or so.  SYMPTOMS TO REPORT IMMEDIATELY:   Following lower endoscopy (colonoscopy or flexible sigmoidoscopy):  Excessive amounts of blood in the stool  Significant tenderness or worsening of abdominal pains  Swelling of the abdomen that is new, acute  Fever of 100F or higher  For urgent or emergent issues, a gastroenterologist can be reached at any hour by calling [phone].   DIET:  We do recommend a small meal at first, but then you may proceed to your regular diet.  Drink plenty of fluids but you should avoid alcoholic beverages for 24 hours.  ACTIVITY:  You should plan to take it easy for the rest of today and you should NOT DRIVE or use heavy machinery until tomorrow (because of the  sedation medicines used during the test).    FOLLOW UP: Our staff will call the number listed on your records the next business day following your procedure to check on you and address any questions or concerns that you may have regarding the information given to you following your procedure. If we do not reach you, we will leave a message.  However, if you are feeling well and you are not experiencing any problems, there is no need to return our call.  We will assume that you have returned to your regular daily activities without incident.  If any biopsies were taken you will be contacted by phone or by letter within the next 1-3 weeks.  Please call us at [phone] if you have not heard about the biopsies in 3 weeks.    SIGNATURES/CONFIDENTIALITY: You and/or your care partner have signed paperwork which will be entered into your electronic medical record.  These signatures attest to the fact that that the information above on your After Visit Summary has been reviewed and is understood.  Full responsibility of the confidentiality of this discharge information lies with you and/or your care-partner. 

## 2018-07-18 ENCOUNTER — Ambulatory Visit (INDEPENDENT_AMBULATORY_CARE_PROVIDER_SITE_OTHER): Payer: No Typology Code available for payment source | Admitting: Physical Therapy

## 2018-07-18 ENCOUNTER — Encounter: Payer: Self-pay | Admitting: Physical Therapy

## 2018-07-18 ENCOUNTER — Telehealth: Payer: Self-pay | Admitting: *Deleted

## 2018-07-18 DIAGNOSIS — M542 Cervicalgia: Secondary | ICD-10-CM

## 2018-07-18 DIAGNOSIS — M6281 Muscle weakness (generalized): Secondary | ICD-10-CM | POA: Diagnosis not present

## 2018-07-18 DIAGNOSIS — R293 Abnormal posture: Secondary | ICD-10-CM

## 2018-07-18 NOTE — Telephone Encounter (Signed)
  Follow up Call-  Call back number 07/16/2018  Post procedure Call Back phone  # [phone]  Permission to leave phone message Yes  Some recent data might be hidden     Patient questions:  Do you have a fever, pain , or abdominal swelling? No. Pain Score  0 *  Have you tolerated food without any problems? Yes.    Have you been able to return to your normal activities? Yes.    Do you have any questions about your discharge instructions: Diet   No. Medications  No. Follow up visit  No.  Do you have questions or concerns about your Care? No.  Actions: * If pain score is 4 or above: No action needed, pain <4.

## 2018-07-18 NOTE — Therapy (Signed)
[place], [place], [address] Phone: [phone]   Fax:  [phone]  Physical Therapy Treatment  Patient Details  Name: Sarah Mullen MRN: [id number] Date of Birth: December 23, 1955 Referring Provider (PT): Kary Kos, MD   Encounter Date: 07/18/2018  PT End of Session - 07/18/18 0844    Visit Number  9    Number of Visits  12    Date for PT Re-Evaluation  07/30/18    Authorization Type   Focus    PT Start Time  0805    PT Stop Time  6761    PT Time Calculation (min)  39 min    Activity Tolerance  Patient tolerated treatment well;No increased pain       Past Medical History:  Diagnosis Date  . Allergic rhinitis, cause unspecified   . Blood transfusion    iron transfusion- [place]-2009  . Chronic headaches   . Depression   . Diverticulitis   . GERD (gastroesophageal reflux disease)   . Heart murmur    diag with echo-physiologic murmur; trace MR, trivial, early systolic MR by 95/09/32 echo ([facility])  . History of sinus tachycardia    07/04/07 Holter monitor: 69-154, average HR 95 bpm. Occ PACs, 4 runs probable atrial tachycardia  . Menometrorrhagia 11/21/2010  . Migraines   . Pre-diabetes     Past Surgical History:  Procedure Laterality Date  . ABDOMINAL HYSTERECTOMY  09/2011  . ANTERIOR CERVICAL DECOMP/DISCECTOMY FUSION N/A 03/27/2018   Procedure: Anteroir Cervical Decrompression Fusion - Cervical Three - Cervical Four - Cervical Four - Cervical Five - Cervical Five - Cervical Six;  Surgeon: Kary Kos, MD;  Location: [facility];  Service: Neurosurgery;  Laterality: N/A;  Anteroir Cervical Decrompression Fusion - Cervical Three - Cervical Four - Cervical Four - Cervical Five - Cervical Five - Cervical Six  . BREAST BIOPSY     right breast/benign  . CERCLAGE REMOVAL  1986   WHEN pregnant  . [place]   1 time  . CYSTOSCOPY  10/17/2011   Procedure: CYSTOSCOPY;   Surgeon: Megan Salon, MD;  Location: [facility];  Service: Gynecology;;  . DILATION AND CURETTAGE OF UTERUS    . TONSILLECTOMY      There were no vitals filed for this visit.  Subjective Assessment - 07/18/18 0808    Subjective  feeling pretty good, a little tired today.    Patient Stated Goals  improve strength, return to work    Currently in Pain?  No/denies                       [facility] Adult PT Treatment/Exercise - 07/18/18 0808      Neck Exercises: Machines for Strengthening   UBE (Upper Arm Bike)  L3 x 4 min (2' each direction)      Neck Exercises: Theraband   Rows  15 reps;Blue    Shoulder External Rotation  15 reps;Green    Horizontal ABduction  15 reps;Green   standing     Neck Exercises: Standing   Wall Push Ups  15 reps    Wall Push Ups Limitations  from counter height    Wall Wash  up/down; lateral; and circles x 15 each direction    Other Standing Exercises  elbow extension; Left; 3# x 15 reps      Neck Exercises: Prone   Neck Retraction  15 reps;5 secs  W Back  15 reps    Shoulder Extension  15 reps      Shoulder Exercises: Sidelying   External Rotation  Left;15 reps;Weights    External Rotation Weight (lbs)  3    External Rotation Limitations  limited motion    ABduction  Left;15 reps;Weights    ABduction Weight (lbs)  3    Other Sidelying Exercises  Lt; horizontal abduction with 3# x 15 reps                  PT Long Term Goals - 07/08/18 0909      PT LONG TERM GOAL #1   Title  independent with HEP    Status  On-going      PT LONG TERM GOAL #2   Title  verbalize and demonstrate proper lifting techniques to decrease risk of injury    Status  Partially Met      PT LONG TERM GOAL #3   Title  demonstrate at least 4+/5 bil shoulder strength for improved strength and ability to return to work    Status  Partially Met      PT LONG TERM GOAL #4   Title  FOTO score improved to </= 41% limited for improved function    Status   Achieved            Plan - 07/18/18 0844    Clinical Impression Statement  Pt continues to have increased Lt shoulder fatigue with exercises, and significant difficulty with external rotation.  Pt will cotninue to benefit from PT to maximize function.    Rehab Potential  Good    PT Frequency  2x / week    PT Duration  6 weeks    PT Treatment/Interventions  ADLs/Self Care Home Management;Cryotherapy;Electrical Stimulation;Moist Heat;Therapeutic activities;Therapeutic exercise;Patient/family education;Neuromuscular re-education;Manual techniques;Taping;Passive range of motion    PT Next Visit Plan  continue progressive UE and postural strengthening.  update HEP as needed.     PT Home Exercise Plan  Access Code: [email or web address].     Consulted and Agree with Plan of Care  Patient       Patient will benefit from skilled therapeutic intervention in order to improve the following deficits and impairments:  Postural dysfunction, Decreased range of motion, Decreased strength, Impaired UE functional use  Visit Diagnosis: Cervicalgia  Abnormal posture  Muscle weakness (generalized)     Problem List Patient Active Problem List   Diagnosis Date Noted  . Spinal stenosis in cervical region 03/27/2018  . Testosterone deficiency 08/22/2017  . Hyperlipidemia 08/22/2017  . Chronic lower back pain 08/22/2017  . Cervical myelopathy ([place]) 05/15/2016  . Vitamin D deficiency 05/15/2016  . Prediabetes 05/14/2016  . Fatty liver 03/09/2016  . Positive TB test 05/28/2012  . GERD (gastroesophageal reflux disease) 11/21/2010  . Depression 11/21/2010  . Dyshidrotic eczema 11/21/2010      Laureen Abrahams, PT, DPT 07/18/18 8:45 AM     [facility] [place], [place], [address] Phone: [phone]   Fax:  [phone]  Name: Sarah Mullen MRN: [id number] Date of Birth: 10/25/1955

## 2018-07-22 ENCOUNTER — Encounter: Payer: Self-pay | Admitting: Physical Therapy

## 2018-07-22 ENCOUNTER — Ambulatory Visit (INDEPENDENT_AMBULATORY_CARE_PROVIDER_SITE_OTHER): Payer: No Typology Code available for payment source | Admitting: Physical Therapy

## 2018-07-22 ENCOUNTER — Encounter: Payer: Self-pay | Admitting: Internal Medicine

## 2018-07-22 DIAGNOSIS — M6281 Muscle weakness (generalized): Secondary | ICD-10-CM

## 2018-07-22 DIAGNOSIS — M542 Cervicalgia: Secondary | ICD-10-CM | POA: Diagnosis not present

## 2018-07-22 DIAGNOSIS — R293 Abnormal posture: Secondary | ICD-10-CM

## 2018-07-22 NOTE — Therapy (Signed)
[place], [place], [address] Phone: [phone]   Fax:  [phone]  Physical Therapy Treatment  Patient Details  Name: Sarah Mullen MRN: [id number] Date of Birth: 1955/08/07 Referring Provider (PT): Kary Kos, MD   Encounter Date: 07/22/2018  PT End of Session - 07/22/18 0928    Visit Number  10    Number of Visits  12    Date for PT Re-Evaluation  07/30/18    Authorization Type  [place] [employer]    PT Start Time  [phone]   pt arrived late   PT Stop Time  0927    PT Time Calculation (min)  31 min    Activity Tolerance  Patient tolerated treatment well;No increased pain       Past Medical History:  Diagnosis Date  . Allergic rhinitis, cause unspecified   . Blood transfusion    iron transfusion- [place]-2009  . Chronic headaches   . Depression   . Diverticulitis   . GERD (gastroesophageal reflux disease)   . Heart murmur    diag with echo-physiologic murmur; trace MR, trivial, early systolic MR by 89/37/34 echo ([facility])  . History of sinus tachycardia    07/04/07 Holter monitor: 69-154, average HR 95 bpm. Occ PACs, 4 runs probable atrial tachycardia  . Menometrorrhagia 11/21/2010  . Migraines   . Pre-diabetes     Past Surgical History:  Procedure Laterality Date  . ABDOMINAL HYSTERECTOMY  09/2011  . ANTERIOR CERVICAL DECOMP/DISCECTOMY FUSION N/A 03/27/2018   Procedure: Anteroir Cervical Decrompression Fusion - Cervical Three - Cervical Four - Cervical Four - Cervical Five - Cervical Five - Cervical Six;  Surgeon: Kary Kos, MD;  Location: [facility];  Service: Neurosurgery;  Laterality: N/A;  Anteroir Cervical Decrompression Fusion - Cervical Three - Cervical Four - Cervical Four - Cervical Five - Cervical Five - Cervical Six  . BREAST BIOPSY     right breast/benign  . CERCLAGE REMOVAL  1986   WHEN pregnant  . [place]   1 time  . CYSTOSCOPY  10/17/2011   Procedure: CYSTOSCOPY;  Surgeon: Megan Salon, MD;  Location: [facility];  Service: Gynecology;;  . DILATION AND CURETTAGE OF UTERUS    . TONSILLECTOMY      There were no vitals filed for this visit.  Subjective Assessment - 07/22/18 0858    Subjective  tried to do some weight exercises lying on side and felt that was better; less compensation    Patient Stated Goals  improve strength, return to work    Currently in Pain?  No/denies                       [facility] Adult PT Treatment/Exercise - 07/22/18 0859      Neck Exercises: Machines for Strengthening   UBE (Upper Arm Bike)  L3 x 4 min (2' each direction)      Neck Exercises: Prone   Shoulder Extension  15 reps;Weights    Shoulder Extension Weights (lbs)  3    Shoulder Extension Limitations  Rt      Shoulder Exercises: Seated   Other Seated Exercises  chest press with 4# x15 reps; 10 sec hold; holding weight with both hands      Shoulder Exercises: Prone   Retraction  Right;15 reps;Weights    Retraction Weight (lbs)  3    Horizontal ABduction 1  Left;15 reps;Weights  Horizontal ABduction 1 Weight (lbs)  3      Shoulder Exercises: Sidelying   External Rotation  Left;15 reps;Weights    External Rotation Weight (lbs)  3    External Rotation Limitations  limited motion    ABduction  Left;15 reps;Weights    ABduction Weight (lbs)  3    Other Sidelying Exercises  Lt; horizontal abduction with 3# x 15 reps      Shoulder Exercises: Standing   Flexion  Left;15 reps;Weights    Shoulder Flexion Weight (lbs)  2    Extension  Both;15 reps;Theraband    Theraband Level (Shoulder Extension)  Level 4 (Blue)    Row  Both;15 reps;Theraband    Theraband Level (Shoulder Row)  Level 4 (Blue)    Other Standing Exercises  trunk rotation with blue theraband x 15 reps bil    Other Standing Exercises  scaption 2# x15                  PT Long Term Goals - 07/08/18 0909      PT LONG TERM GOAL #1   Title  independent  with HEP    Status  On-going      PT LONG TERM GOAL #2   Title  verbalize and demonstrate proper lifting techniques to decrease risk of injury    Status  Partially Met      PT LONG TERM GOAL #3   Title  demonstrate at least 4+/5 bil shoulder strength for improved strength and ability to return to work    Status  Partially Met      PT LONG TERM GOAL #4   Title  FOTO score improved to </= 41% limited for improved function    Status  Achieved            Plan - 07/22/18 0928    Clinical Impression Statement  Pt demonstrating decreased substitution patterns with strengthening exercises, and progressing well with PT.  Plan to decr frequency to 1x/wk starting next week.    Rehab Potential  Good    PT Frequency  2x / week    PT Duration  6 weeks    PT Treatment/Interventions  ADLs/Self Care Home Management;Cryotherapy;Electrical Stimulation;Moist Heat;Therapeutic activities;Therapeutic exercise;Patient/family education;Neuromuscular re-education;Manual techniques;Taping;Passive range of motion    PT Next Visit Plan  continue progressive UE and postural strengthening.  update HEP as needed. look at goals and plan for recert    PT Home Exercise Plan  Access Code: [email or web address].     Consulted and Agree with Plan of Care  Patient       Patient will benefit from skilled therapeutic intervention in order to improve the following deficits and impairments:  Postural dysfunction, Decreased range of motion, Decreased strength, Impaired UE functional use  Visit Diagnosis: Cervicalgia  Abnormal posture  Muscle weakness (generalized)     Problem List Patient Active Problem List   Diagnosis Date Noted  . Spinal stenosis in cervical region 03/27/2018  . Testosterone deficiency 08/22/2017  . Hyperlipidemia 08/22/2017  . Chronic lower back pain 08/22/2017  . Cervical myelopathy ([place]) 05/15/2016  . Vitamin D deficiency 05/15/2016  . Prediabetes 05/14/2016  . Fatty liver 03/09/2016  .  Positive TB test 05/28/2012  . GERD (gastroesophageal reflux disease) 11/21/2010  . Depression 11/21/2010  . Dyshidrotic eczema 11/21/2010     Laureen Abrahams, PT, DPT 07/22/18 9:29 AM    [facility] [place], [place], [address] Phone:  [phone]   Fax:  [phone]  Name: MCKENLEIGH TARLTON MRN: [id number] Date of Birth: [birth year]

## 2018-07-23 ENCOUNTER — Other Ambulatory Visit: Payer: Self-pay | Admitting: Internal Medicine

## 2018-07-26 ENCOUNTER — Ambulatory Visit (INDEPENDENT_AMBULATORY_CARE_PROVIDER_SITE_OTHER): Payer: No Typology Code available for payment source | Admitting: Physical Therapy

## 2018-07-26 DIAGNOSIS — M542 Cervicalgia: Secondary | ICD-10-CM

## 2018-07-26 DIAGNOSIS — M6281 Muscle weakness (generalized): Secondary | ICD-10-CM

## 2018-07-26 DIAGNOSIS — R293 Abnormal posture: Secondary | ICD-10-CM

## 2018-07-26 NOTE — Therapy (Signed)
[place], [place], [address] Phone: [phone]   Fax:  [phone]  Physical Therapy Treatment  Patient Details  Name: Sarah Mullen MRN: [id number] Date of Birth: Mar 05, 1956 Referring Provider (PT): Kary Kos, MD   Encounter Date: 07/26/2018  PT End of Session - 07/26/18 0856    Visit Number  11    Number of Visits  12    Date for PT Re-Evaluation  07/30/18    Authorization Type  [place] [employer]    PT Start Time  [phone]   pt arrived late   PT Stop Time  0930    PT Time Calculation (min)  35 min    Activity Tolerance  Patient tolerated treatment well;No increased pain    Behavior During Therapy  WFL for tasks assessed/performed       Past Medical History:  Diagnosis Date  . Allergic rhinitis, cause unspecified   . Blood transfusion    iron transfusion- [place]-2009  . Chronic headaches   . Depression   . Diverticulitis   . GERD (gastroesophageal reflux disease)   . Heart murmur    diag with echo-physiologic murmur; trace MR, trivial, early systolic MR by 90/30/09 echo ([facility])  . History of sinus tachycardia    07/04/07 Holter monitor: 69-154, average HR 95 bpm. Occ PACs, 4 runs probable atrial tachycardia  . Menometrorrhagia 11/21/2010  . Migraines   . Pre-diabetes     Past Surgical History:  Procedure Laterality Date  . ABDOMINAL HYSTERECTOMY  09/2011  . ANTERIOR CERVICAL DECOMP/DISCECTOMY FUSION N/A 03/27/2018   Procedure: Anteroir Cervical Decrompression Fusion - Cervical Three - Cervical Four - Cervical Four - Cervical Five - Cervical Five - Cervical Six;  Surgeon: Kary Kos, MD;  Location: [facility];  Service: Neurosurgery;  Laterality: N/A;  Anteroir Cervical Decrompression Fusion - Cervical Three - Cervical Four - Cervical Four - Cervical Five - Cervical Five - Cervical Six  . BREAST BIOPSY     right breast/benign  . CERCLAGE REMOVAL  1986   WHEN pregnant  .  [place]   1 time  . CYSTOSCOPY  10/17/2011   Procedure: CYSTOSCOPY;  Surgeon: Megan Salon, MD;  Location: [facility];  Service: Gynecology;;  . DILATION AND CURETTAGE OF UTERUS    . TONSILLECTOMY      There were no vitals filed for this visit.  Subjective Assessment - 07/26/18 0859    Subjective  Pt reports she had some soreness in the Lt side of her neck, but it was short lived.  She returns to work on Tuesday.     Patient Stated Goals  improve strength, return to work    Currently in Pain?  No/denies    Pain Score  0-No pain         [facility] PT Assessment - 07/26/18 0001      Assessment   Medical Diagnosis  ACDF C3-6    Referring Provider (PT)  Kary Kos, MD    Onset Date/Surgical Date  03/27/18    Hand Dominance  Right    Prior Therapy  intermittent; has PT for neck 2 years prior to surgery       [facility] Adult PT Treatment/Exercise - 07/26/18 0001      Shoulder Exercises: Prone   Horizontal ABduction 1  Left;10 reps;Weights    Horizontal ABduction 1 Weight (lbs)  2    Horizontal ABduction 1 Limitations  3#  too difficult.       Shoulder Exercises: Sidelying   External Rotation  Left;15 reps;Weights    External Rotation Weight (lbs)  2    External Rotation Limitations  challenging     ABduction  Left;15 reps;Weights    ABduction Weight (lbs)  3      Shoulder Exercises: Standing   Flexion  Left;15 reps;Weights    Shoulder Flexion Weight (lbs)  3    Extension  Both;10 reps;Theraband    Theraband Level (Shoulder Extension)  Level 4 (Blue)    Row  Both;15 reps;Theraband   5 sec pause in retraction   Theraband Level (Shoulder Row)  Level 4 (Blue)    Other Standing Exercises  scaption 3# x15, LUE/ RUE      Shoulder Exercises: ROM/Strengthening   UBE (Upper Arm Bike)  L3: 2 min each direction, standing.     Cybex Press  2 plate;10 reps   5 reps elbows in, 5 reps elbows out     Shoulder Exercises: Stretch   Other Shoulder Stretches  3 position doorway  stretch (high level performed unillateral) x 15 sec x 2 reps each .       Manual Therapy   Soft tissue mobilization  IASTM with Hana Emi tool to bilat upper trap, scalenes, and cervical paraspinals to decrease fascial tightness and improve ROM      Neck Exercises: Stretches   Upper Trapezius Stretch  Left;Right;2 reps;20 seconds                  PT Long Term Goals - 07/08/18 0909      PT LONG TERM GOAL #1   Title  independent with HEP    Status  On-going      PT LONG TERM GOAL #2   Title  verbalize and demonstrate proper lifting techniques to decrease risk of injury    Status  Partially Met      PT LONG TERM GOAL #3   Title  demonstrate at least 4+/5 bil shoulder strength for improved strength and ability to return to work    Status  Partially Met      PT LONG TERM GOAL #4   Title  FOTO score improved to </= 41% limited for improved function    Status  Achieved            Plan - 07/26/18 0910    Clinical Impression Statement  Pt tolerated increased weight with standing shoulder strengthening exercises, without increase in symptoms.  On track to meet goals.     Rehab Potential  Good    PT Frequency  2x / week    PT Duration  6 weeks    PT Treatment/Interventions  ADLs/Self Care Home Management;Cryotherapy;Electrical Stimulation;Moist Heat;Therapeutic activities;Therapeutic exercise;Patient/family education;Neuromuscular re-education;Manual techniques;Taping;Passive range of motion    PT Next Visit Plan  end of POC; assess readiness to d/c vs continued therapy vs hold therapy.      PT Home Exercise Plan  Access Code: [email or web address].     Consulted and Agree with Plan of Care  Patient       Patient will benefit from skilled therapeutic intervention in order to improve the following deficits and impairments:  Postural dysfunction, Decreased range of motion, Decreased strength, Impaired UE functional use  Visit Diagnosis: Cervicalgia  Abnormal posture  Muscle  weakness (generalized)     Problem List Patient Active Problem List   Diagnosis Date Noted  . Spinal stenosis in cervical region 03/27/2018  .  Testosterone deficiency 08/22/2017  . Hyperlipidemia 08/22/2017  . Chronic lower back pain 08/22/2017  . Cervical myelopathy ([place]) 05/15/2016  . Vitamin D deficiency 05/15/2016  . Prediabetes 05/14/2016  . Fatty liver 03/09/2016  . Positive TB test 05/28/2012  . GERD (gastroesophageal reflux disease) 11/21/2010  . Depression 11/21/2010  . Dyshidrotic eczema 11/21/2010   Kerin Perna, PTA 07/26/18 9:52 AM  [facility] [place], [place], [address] Phone: [phone]   Fax:  [phone]  Name: Sarah Mullen MRN: [id number] Date of Birth: May 10, 1956

## 2018-07-29 ENCOUNTER — Ambulatory Visit (INDEPENDENT_AMBULATORY_CARE_PROVIDER_SITE_OTHER): Payer: No Typology Code available for payment source | Admitting: Physical Therapy

## 2018-07-29 ENCOUNTER — Encounter: Payer: Self-pay | Admitting: Physical Therapy

## 2018-07-29 DIAGNOSIS — R293 Abnormal posture: Secondary | ICD-10-CM | POA: Diagnosis not present

## 2018-07-29 DIAGNOSIS — M6281 Muscle weakness (generalized): Secondary | ICD-10-CM | POA: Diagnosis not present

## 2018-07-29 DIAGNOSIS — M542 Cervicalgia: Secondary | ICD-10-CM | POA: Diagnosis not present

## 2018-07-29 NOTE — Therapy (Addendum)
[facility] [place], [place], [address] Phone: [phone]   Fax:  [phone]  Physical Therapy Treatment/Recertification/Discharge  Patient Details  Name: Sarah Mullen MRN: [id number] Date of Birth: 1956/07/20 Referring Provider (PT): Kary Kos, MD   Encounter Date: 07/29/2018  PT End of Session - 07/29/18 0809    Visit Number  12    Number of Visits  16    Date for PT Re-Evaluation  08/26/18    Authorization Type   [employer]    PT Start Time  7341   pt arrived late   PT Stop Time  0804    PT Time Calculation (min)  32 min    Activity Tolerance  Patient tolerated treatment well;No increased pain    Behavior During Therapy  WFL for tasks assessed/performed       Past Medical History:  Diagnosis Date  . Allergic rhinitis, cause unspecified   . Blood transfusion    iron transfusion- [place]-2009  . Chronic headaches   . Depression   . Diverticulitis   . GERD (gastroesophageal reflux disease)   . Heart murmur    diag with echo-physiologic murmur; trace MR, trivial, early systolic MR by 93/79/02 echo ([facility])  . History of sinus tachycardia    07/04/07 Holter monitor: 69-154, average HR 95 bpm. Occ PACs, 4 runs probable atrial tachycardia  . Menometrorrhagia 11/21/2010  . Migraines   . Pre-diabetes     Past Surgical History:  Procedure Laterality Date  . ABDOMINAL HYSTERECTOMY  09/2011  . ANTERIOR CERVICAL DECOMP/DISCECTOMY FUSION N/A 03/27/2018   Procedure: Anteroir Cervical Decrompression Fusion - Cervical Three - Cervical Four - Cervical Four - Cervical Five - Cervical Five - Cervical Six;  Surgeon: Kary Kos, MD;  Location: [facility];  Service: Neurosurgery;  Laterality: N/A;  Anteroir Cervical Decrompression Fusion - Cervical Three - Cervical Four - Cervical Four - Cervical Five - Cervical Five - Cervical Six  . BREAST BIOPSY     right breast/benign  . CERCLAGE REMOVAL  1986    WHEN pregnant  . [place]   1 time  . CYSTOSCOPY  10/17/2011   Procedure: CYSTOSCOPY;  Surgeon: Megan Salon, MD;  Location: [facility];  Service: Gynecology;;  . DILATION AND CURETTAGE OF UTERUS    . TONSILLECTOMY      There were no vitals filed for this visit.  Subjective Assessment - 07/29/18 0735    Subjective  Pt reports she went to gym yesterday; everything went well but she did notice her Lt arm fatiguing quicker than Rt.  She returns to work tomorrow.     Currently in Pain?  No/denies    Pain Score  0-No pain         [facility] PT Assessment - 07/29/18 0001      Assessment   Medical Diagnosis  ACDF C3-6    Referring Provider (PT)  Kary Kos, MD    Onset Date/Surgical Date  03/27/18    Hand Dominance  Right    Next MD Visit  March 2020    Prior Therapy  intermittent; has PT for neck 2 years prior to surgery      Strength   Right/Left Shoulder  Right;Left    Right Shoulder Flexion  5/5    Right Shoulder Extension  5/5    Right Shoulder ABduction  5/5    Right Shoulder Internal Rotation  5/5    Right  Shoulder External Rotation  --   5-/5   Left Shoulder Flexion  4+/5    Left Shoulder Extension  5/5    Left Shoulder ABduction  --   5-/5   Left Shoulder Internal Rotation  --   5-/5   Left Shoulder External Rotation  4-/5        [facility] Adult PT Treatment/Exercise - 07/29/18 0001      Shoulder Exercises: Seated   External Rotation  Strengthening;Both;10 reps;Theraband   2 sets; limited motion on Lt   Theraband Level (Shoulder External Rotation)  Level 1 (Yellow);Level 2 (Red)    External Rotation Limitations  diffiulty keeping Lt elbow tucked at side, placed paper/ washcloth at elbow for cue    Flexion  Strengthening;Right;Left;Weights;12 reps   to 90 deg   Flexion Weight (lbs)  3# for RUE, 2# for LUE      Shoulder Exercises: Sidelying   External Rotation  Left;15 reps;Weights    External Rotation Weight (lbs)  2    External Rotation Limitations   challenging; cues for form and positioning.        Shoulder Exercises: ROM/Strengthening   UBE (Upper Arm Bike)  L2: 1 min each direction    Wall Pushups  10 reps    Wall Pushups Limitations  2nd set of 10 on counter top      Shoulder Exercises: Stretch   Other Shoulder Stretches  3 position doorway stretch (high level performed unillateral) x 15 sec x 2 reps each .     Other Shoulder Stretches  tricep stretch with door frame assist x 15 sec x 2 reps each side;  bicep stretch with arms behind back (band between hands) x 20 sec x 2 reps.       Manual Therapy   Soft tissue mobilization  IASTM with Hana Emi tool to bilat upper trap, scalenes, and cervical paraspinals to decrease fascial tightness and improve ROM            PT Long Term Goals - 07/29/18 0736      PT LONG TERM GOAL #1   Title  independent with HEP    Status  On-going    Target Date  08/26/18      PT LONG TERM GOAL #2   Title  verbalize and demonstrate proper lifting techniques to decrease risk of injury    Baseline  1/6: will continue goal and address concerns PRN    Status  Partially Met    Target Date  08/26/18      PT LONG TERM GOAL #3   Title  demonstrate at least 4+/5 bil shoulder strength for improved strength and ability to return to work    Baseline  1/6: will continue goal; ER strength may not reach 4+/5    Status  Partially Met   still has weakness in Lt ER   Target Date  09/09/18      PT LONG TERM GOAL #4   Title  FOTO score improved to </= 41% limited for improved function    Status  Achieved            Plan - 07/29/18 0746    Clinical Impression Statement  Pt continues to demonstrate weakness in Lt shoulder flexion and ER strength.  She is tolerating her exercises without increased symptoms other than fatigue.  She has partially met her goals. She will benefit from continued PT intervention to assist in return to work and prevention of reinjury.  Rehab Potential  Good    PT Frequency   1x / week    PT Duration  4 weeks    PT Treatment/Interventions  ADLs/Self Care Home Management;Cryotherapy;Electrical Stimulation;Moist Heat;Therapeutic activities;Therapeutic exercise;Patient/family education;Neuromuscular re-education;Manual techniques;Taping;Passive range of motion    PT Next Visit Plan  spoke to supervising PT; will request additional visits.  continue strengthening and work simulated activities pt may need assistance performing    PT [place]  Access Code: [email or web address].     Consulted and Agree with Plan of Care  Patient       Patient will benefit from skilled therapeutic intervention in order to improve the following deficits and impairments:  Postural dysfunction, Decreased range of motion, Decreased strength, Impaired UE functional use  Visit Diagnosis: Cervicalgia - Plan: PT plan of care cert/re-cert  Abnormal posture - Plan: PT plan of care cert/re-cert  Muscle weakness (generalized) - Plan: PT plan of care cert/re-cert     Problem List Patient Active Problem List   Diagnosis Date Noted  . Spinal stenosis in cervical region 03/27/2018  . Testosterone deficiency 08/22/2017  . Hyperlipidemia 08/22/2017  . Chronic lower back pain 08/22/2017  . Cervical myelopathy ([place]) 05/15/2016  . Vitamin D deficiency 05/15/2016  . Prediabetes 05/14/2016  . Fatty liver 03/09/2016  . Positive TB test 05/28/2012  . GERD (gastroesophageal reflux disease) 11/21/2010  . Depression 11/21/2010  . Dyshidrotic eczema 11/21/2010   Kerin Perna, PTA 07/29/18 8:19 PM   Laureen Abrahams, PT, DPT 07/29/18 8:19 PM    [facility] [address], [place], [address] Phone: [phone]   Fax:  [phone]  Name: DAMIANA BERRIAN MRN: [id number] Date of Birth: June 30, 1956     PHYSICAL THERAPY DISCHARGE SUMMARY  Visits from Start of Care: 12  Current functional level related to goals /  functional outcomes: See above   Remaining deficits: See above   Education / Equipment: HEP  Plan: Patient agrees to discharge.  Patient goals were partially met. Patient is being discharged due to not returning since the last visit.  ?????     Laureen Abrahams, PT, DPT 08/26/18 2:03 PM  [facility] [place], [place] [address]  [phone] (office) [phone] (fax)

## 2018-07-29 NOTE — Addendum Note (Signed)
Addended by: Laureen Abrahams on: 07/29/2018 08:19 PM   Modules accepted: Orders

## 2018-09-11 MED FILL — ESTRADIOL 0.1 MG/24HR PTTW: 0.1 | 84 days supply | Qty: 24 | Fill #3

## 2018-10-04 ENCOUNTER — Telehealth: Payer: Self-pay | Admitting: Obstetrics & Gynecology

## 2018-10-04 ENCOUNTER — Encounter: Payer: Self-pay | Admitting: Obstetrics & Gynecology

## 2018-10-04 NOTE — Telephone Encounter (Signed)
Routing request to Dr. Sabra Heck to review and advise.  Does she need testosterone lab work. Annual scheduled for 12/05/2018

## 2018-10-04 NOTE — Telephone Encounter (Signed)
Patient sent the following message through [facility]. Routing to triage to assist patient with request.  I have run out of testosterone and have no refill and think my level is dropping could you call in a new script for the testosterone to [facility] Thank you

## 2018-10-08 ENCOUNTER — Other Ambulatory Visit: Payer: Self-pay

## 2018-10-08 NOTE — Telephone Encounter (Signed)
Medication refill request: Testosterone HRT  Last AEX:  09/14/17 Next AEX: 12/05/2018 Last MMG (if hormonal medication request): 09/06/17 Refill authorized: #60 [id number] 1 rf

## 2018-10-15 ENCOUNTER — Encounter: Payer: No Typology Code available for payment source | Admitting: Internal Medicine

## 2018-10-15 ENCOUNTER — Other Ambulatory Visit: Payer: Self-pay | Admitting: Internal Medicine

## 2018-10-15 ENCOUNTER — Other Ambulatory Visit: Payer: Self-pay | Admitting: Obstetrics & Gynecology

## 2018-10-15 MED ORDER — NONFORMULARY OR COMPOUNDED ITEM: 2 refills | Status: DC

## 2018-10-15 MED FILL — DULOXETINE HCL 60 MG CPEP: 60 | 90 days supply | Qty: 90 | Fill #0

## 2018-10-15 MED FILL — PANTOPRAZOLE SOD DR 40 MG T: 40 | 90 days supply | Qty: 90 | Fill #0

## 2018-10-15 NOTE — Telephone Encounter (Signed)
rx faxed to [facility].

## 2018-10-15 NOTE — Telephone Encounter (Signed)
Patient notified. Patient verbalizes understanding and is agreeable.   Encounter closed.

## 2018-10-15 NOTE — Telephone Encounter (Signed)
Rx faxed to [facility].  Will do blood work at [facility].

## 2018-12-05 ENCOUNTER — Encounter

## 2018-12-05 ENCOUNTER — Ambulatory Visit: Payer: No Typology Code available for payment source | Admitting: Obstetrics & Gynecology

## 2019-01-08 ENCOUNTER — Other Ambulatory Visit: Payer: Self-pay | Admitting: Internal Medicine

## 2019-01-08 ENCOUNTER — Other Ambulatory Visit: Payer: Self-pay | Admitting: Obstetrics & Gynecology

## 2019-01-08 MED FILL — PANTOPRAZOLE SOD DR 40 MG T: 40 | 30 days supply | Qty: 30 | Fill #0

## 2019-01-08 MED FILL — DULoxetine HCL 60 MG CPEP: 60 | 30 days supply | Qty: 30 | Fill #0

## 2019-01-08 NOTE — Telephone Encounter (Signed)
Medication refill request: Vivelle Dot  Last AEX: 09/14/17 Next AEX: Left message for patient to schedule AEX  Last MMG (if hormonal medication 09/06/17 Bi-rads 2 benign   request): Refill authorized: #24 AND 0rf

## 2019-01-09 ENCOUNTER — Encounter: Payer: Self-pay | Admitting: Obstetrics & Gynecology

## 2019-01-09 MED FILL — ESTRADIOL 0.1 MG/24HR PTTW: 0.1 | 84 days supply | Qty: 24 | Fill #0

## 2019-01-13 ENCOUNTER — Encounter: Payer: Self-pay | Admitting: Obstetrics & Gynecology

## 2019-01-13 ENCOUNTER — Other Ambulatory Visit: Payer: Self-pay | Admitting: Neurosurgery

## 2019-01-13 ENCOUNTER — Encounter: Payer: Self-pay | Admitting: Internal Medicine

## 2019-01-13 ENCOUNTER — Other Ambulatory Visit (HOSPITAL_COMMUNITY): Payer: Self-pay | Admitting: Neurosurgery

## 2019-01-13 DIAGNOSIS — M5440 Lumbago with sciatica, unspecified side: Secondary | ICD-10-CM

## 2019-01-14 ENCOUNTER — Encounter: Payer: No Typology Code available for payment source | Admitting: Internal Medicine

## 2019-01-16 ENCOUNTER — Encounter: Payer: Self-pay | Admitting: Internal Medicine

## 2019-01-17 ENCOUNTER — Other Ambulatory Visit: Payer: Self-pay

## 2019-01-17 ENCOUNTER — Ambulatory Visit: Payer: Self-pay

## 2019-01-17 ENCOUNTER — Ambulatory Visit (INDEPENDENT_AMBULATORY_CARE_PROVIDER_SITE_OTHER): Payer: No Typology Code available for payment source | Admitting: Family Medicine

## 2019-01-17 ENCOUNTER — Encounter: Payer: Self-pay | Admitting: Family Medicine

## 2019-01-17 DIAGNOSIS — M25562 Pain in left knee: Secondary | ICD-10-CM

## 2019-01-17 NOTE — Patient Instructions (Signed)
Nice to meet you Please try ice and tylenol.  Please try ibuprofen  Please try compression. [email or web address] is a good compression sleeve   Please send me a message in MyChart with any questions or updates.  Please see me back in 4 weeks if no improvement.   --Dr. Raeford Razor

## 2019-01-17 NOTE — Telephone Encounter (Signed)
Called Sarah Mullen back since she is wanting to see someone today. Sarah Mullen states her knee is really hurting her. Made appt for 2:00 w/dr. Raeford Razor. Sarah Mullen denied any COVID sxs, and inform must wear mask to appt.Marland KitchenJohny Chess

## 2019-01-17 NOTE — Progress Notes (Signed)
Sarah Mullen - 63 y.o. female MRN [id number]  Date of birth: 04-11-1956  SUBJECTIVE:  Including CC & ROS.  No chief complaint on file.   Sarah Mullen is a 63 y.o. female that is presenting with left knee pain. The pain is acute in nature. Pain is occurring posteriorly. No inciting event. Pain is intermittent and localized to the knee.  No mechanical symptoms. Reports swelling. No improvement with modalities to date.    Review of Systems  Constitutional: Negative for fever.  HENT: Negative for congestion.   Respiratory: Negative for cough.   Cardiovascular: Negative for chest pain.  Gastrointestinal: Negative for abdominal pain.  Musculoskeletal: Negative for back pain.  Skin: Negative for color change.  Neurological: Negative for weakness.  Hematological: Negative for adenopathy.    HISTORY: Past Medical, Surgical, Social, and Family History Reviewed & Updated per EMR.   Pertinent Historical Findings include:  Past Medical History:  Diagnosis Date   Allergic rhinitis, cause unspecified    Blood transfusion    iron transfusion- [place]-2009   Chronic headaches    Depression    Diverticulitis    GERD (gastroesophageal reflux disease)    Heart murmur    diag with echo-physiologic murmur; trace MR, trivial, early systolic MR by 07/86/75 echo ([facility])   History of sinus tachycardia    07/04/07 Holter monitor: 69-154, average HR 95 bpm. Occ PACs, 4 runs probable atrial tachycardia   Menometrorrhagia 11/21/2010   Migraines    Pre-diabetes     Past Surgical History:  Procedure Laterality Date   ABDOMINAL HYSTERECTOMY  09/2011   ANTERIOR CERVICAL DECOMP/DISCECTOMY FUSION N/A 03/27/2018   Procedure: Anteroir Cervical Decrompression Fusion - Cervical Three - Cervical Four - Cervical Four - Cervical Five - Cervical Five - Cervical Six;  Surgeon: Kary Kos, MD;  Location: [facility];  Service: Neurosurgery;  Laterality: N/A;  Anteroir Cervical Decrompression  Fusion - Cervical Three - Cervical Four - Cervical Four - Cervical Five - Cervical Five - Cervical Six   BREAST BIOPSY     right breast/benign   CERCLAGE REMOVAL  1986   WHEN pregnant   CESAREAN SECTION  1986   1 time   CYSTOSCOPY  10/17/2011   Procedure: CYSTOSCOPY;  Surgeon: Megan Salon, MD;  Location: [facility];  Service: Gynecology;;   DILATION AND CURETTAGE OF UTERUS     TONSILLECTOMY      Allergies  Allergen Reactions   Kiwi Extract     Throat itching    Contrast Media [Iodinated Diagnostic Agents] Rash   Keflex [Cephalexin] Rash    Family History  Problem Relation Age of Onset   Arthritis Mother    Hyperlipidemia Mother    Heart disease Mother    Arthritis Father    Lung cancer Father        smoker   Heart disease Father    Hypertension Father    Diabetes Father    Colon cancer Paternal Aunt    CAD Paternal Grandmother    Kidney disease Maternal Grandfather        ESRD on HD   Kidney disease Maternal Grandmother        ESRD on HD   Diabetes Brother    Metabolic syndrome Sister    Stomach cancer Neg Hx    Rectal cancer Neg Hx    Esophageal cancer Neg Hx      Social History   Socioeconomic History   Marital status: Single  Spouse name: Not on file   Number of children: Not on file   Years of education: Not on file   Highest education level: Not on file  Occupational History   Not on file  Social Needs   Financial resource strain: Not on file   Food insecurity    Worry: Not on file    Inability: Not on file   Transportation needs    Medical: Not on file    Non-medical: Not on file  Tobacco Use   Smoking status: Former Smoker    Types: Cigarettes    Quit date: 10/09/2001    Years since quitting: 17.2   Smokeless tobacco: Never Used  Substance and Sexual Activity   Alcohol use: No    Alcohol/week: 0.0 standard drinks   Drug use: No   Sexual activity: Yes    Partners: Male    Birth control/protection:  Surgical    Comment: TLH/BSO  Lifestyle   Physical activity    Days per week: Not on file    Minutes per session: Not on file   Stress: Not on file  Relationships   Social connections    Talks on phone: Not on file    Gets together: Not on file    Attends religious service: Not on file    Active member of club or organization: Not on file    Attends meetings of clubs or organizations: Not on file    Relationship status: Not on file   Intimate partner violence    Fear of current or ex partner: Not on file    Emotionally abused: Not on file    Physically abused: Not on file    Forced sexual activity: Not on file  Other Topics Concern   Not on file  Social History Narrative   Not on file     PHYSICAL EXAM:  VS: BP 128/74    Pulse 98    Resp 14    Wt 166 lb (75.3 kg)    LMP 09/24/2011    SpO2 99%    BMI 31.37 kg/m  Physical Exam Gen: NAD, alert, cooperative with exam, well-appearing ENT: normal lips, normal nasal mucosa,  Eye: normal EOM, normal conjunctiva and lids CV:  no edema, +2 pedal pulses   Resp: no accessory muscle use, non-labored,  GI: no masses or tenderness, no hernia  Skin: no rashes, no areas of induration  Neuro: normal tone, normal sensation to touch Psych:  normal insight, alert and oriented MSK:  Left knee:  No effusion  Normal ROM  Normal strength to resistance  TTP in posterior aspect  No instability  Neurovascularly intact   Limited ultrasound: left knee:  Mild effusion  Normal appearing QT and PT  Mild narrowing of the medial joint line  Baker's cyst present   Summary: mild degenerative changes present.   Ultrasound and interpretation by Clearance Coots, MD       ASSESSMENT & PLAN:   Left knee pain Likely related to degenerative meniscus vs arthritic changes.  - counseled on HEp and supportive care - counseled on compression  - if no improvement imaging, PT or injection.

## 2019-01-20 NOTE — Assessment & Plan Note (Signed)
Likely related to degenerative meniscus vs arthritic changes.  - counseled on HEp and supportive care - counseled on compression  - if no improvement imaging, PT or injection.

## 2019-01-28 ENCOUNTER — Encounter: Payer: Self-pay | Admitting: Internal Medicine

## 2019-01-29 ENCOUNTER — Other Ambulatory Visit: Payer: No Typology Code available for payment source

## 2019-01-29 ENCOUNTER — Encounter: Payer: Self-pay | Admitting: Internal Medicine

## 2019-01-29 ENCOUNTER — Ambulatory Visit (INDEPENDENT_AMBULATORY_CARE_PROVIDER_SITE_OTHER): Payer: No Typology Code available for payment source | Admitting: Internal Medicine

## 2019-01-29 ENCOUNTER — Telehealth: Payer: Self-pay

## 2019-01-29 DIAGNOSIS — R197 Diarrhea, unspecified: Secondary | ICD-10-CM

## 2019-01-29 DIAGNOSIS — R112 Nausea with vomiting, unspecified: Secondary | ICD-10-CM | POA: Diagnosis not present

## 2019-01-29 MED ORDER — ONDANSETRON 4 MG PO TBDP: 4.0000 mg | ORAL_TABLET | Freq: Three times a day (TID) | ORAL | 0 refills | Status: DC | PRN

## 2019-01-29 NOTE — Assessment & Plan Note (Signed)
Related to N/V ? Infectious - bacterial, viral ? microscopic colitis, enteritis Will check stools studies Screen for COVID zofran prn - Increase fluids If unable to keep anything down or has persistent diarrhea will need to go to ED No abdominal pain so will hold off on imaging Call if no improvement

## 2019-01-29 NOTE — Telephone Encounter (Signed)
Pt needs to be tested for COVID for diarrhea. Call back number [phone]

## 2019-01-29 NOTE — Assessment & Plan Note (Signed)
Related to diarrhea ? Infectious - bacterial, viral ? microscopic colitis, enteritis Will check stools studies Screen for COVID zofran prn Increase fluids If unable to keep anything down will need to go to ED

## 2019-01-29 NOTE — Progress Notes (Signed)
Virtual Visit via Video Note  I connected with Sarah Mullen on 01/29/19 at 11:30 AM EDT by a video enabled telemedicine application and verified that I am speaking with the correct person using two identifiers.   I discussed the limitations of evaluation and management by telemedicine and the availability of in person appointments. The patient expressed understanding and agreed to proceed.  The patient is currently at home and I am in the office.    No referring provider.    History of Present Illness: This is an acute visit for diarrhea  Last week started having diarrhea.  Monday woke up and vomited.  She has been having diarrhea and nausea/vomiting since then.  Her stool is yellow in color.  Her symptoms have been steady and there has been no sign of improvement.  She denies any fevers or chills.  She denies blood in the stool and vomit and has not had any abdominal pain.  She has had some dizziness and lightheadedness.  She has taken all the precautions for avoiding coronavirus.   No contact with anyone,she is taking all the precautions.      She denies any travel, new medications or supplements.  She denies any recent antibiotics.  She has been taking ibuprofen 800 mg at night for an orthopedic issue.  She does not use artificial sweetners.    Started new diet recently - eating more fruit, which she decreased since the diarrhea started.     Review of Systems  Constitutional: Positive for diaphoresis (when vomiting) and malaise/fatigue. Negative for fever.  HENT: Negative for congestion and sore throat.   Respiratory: Negative for cough and shortness of breath.   Gastrointestinal: Positive for diarrhea, nausea and vomiting. Negative for abdominal pain and blood in stool.  Musculoskeletal: Negative for myalgias.  Neurological: Positive for dizziness and headaches.      Social History   Socioeconomic History  . Marital status: Single    Spouse name: Not on file  . Number of  children: Not on file  . Years of education: Not on file  . Highest education level: Not on file  Occupational History  . Not on file  Social Needs  . Financial resource strain: Not on file  . Food insecurity    Worry: Not on file    Inability: Not on file  . Transportation needs    Medical: Not on file    Non-medical: Not on file  Tobacco Use  . Smoking status: Former Smoker    Types: Cigarettes    Quit date: 10/09/2001    Years since quitting: 17.3  . Smokeless tobacco: Never Used  Substance and Sexual Activity  . Alcohol use: No    Alcohol/week: 0.0 standard drinks  . Drug use: No  . Sexual activity: Yes    Partners: Male    Birth control/protection: Surgical    Comment: TLH/BSO  Lifestyle  . Physical activity    Days per week: Not on file    Minutes per session: Not on file  . Stress: Not on file  Relationships  . Social Herbalist on phone: Not on file    Gets together: Not on file    Attends religious service: Not on file    Active member of club or organization: Not on file    Attends meetings of clubs or organizations: Not on file    Relationship status: Not on file  Other Topics Concern  . Not on file  Social History Narrative  . Not on file     Observations/Objective: Appears well, but obviously not feeling well, but in NAD   Assessment and Plan:  See Problem List for Assessment and Plan of chronic medical problems.   Follow Up Instructions:    I discussed the assessment and treatment plan with the patient. The patient was provided an opportunity to ask questions and all were answered. The patient agreed with the plan and demonstrated an understanding of the instructions.   The patient was advised to call back or seek an in-person evaluation if the symptoms worsen or if the condition fails to improve as anticipated.    Binnie Rail, MD

## 2019-01-29 NOTE — Addendum Note (Signed)
Addended by: Raliegh Ip on: 01/29/2019 04:38 PM   Modules accepted: Orders

## 2019-01-30 ENCOUNTER — Telehealth: Payer: Self-pay | Admitting: General Practice

## 2019-01-30 ENCOUNTER — Other Ambulatory Visit: Payer: No Typology Code available for payment source

## 2019-01-30 DIAGNOSIS — R197 Diarrhea, unspecified: Secondary | ICD-10-CM

## 2019-01-30 DIAGNOSIS — Z20822 Contact with and (suspected) exposure to covid-19: Secondary | ICD-10-CM

## 2019-01-30 NOTE — Telephone Encounter (Signed)
Pt has been scheduled for covid testing.  Pt was referred PY:YFRTM, Claudina Lick, MD

## 2019-02-01 LAB — CLOSTRIDIUM DIFFICILE EIA: C difficile Toxins A+B, EIA: NEGATIVE

## 2019-02-01 IMAGING — MR MR CERVICAL SPINE W/O CM
4 of 5 series · 17 of 48 positions shown · non-contrast
Comparison: Cervical spine MRI 11/10/2015.

CLINICAL DATA: 61-year-old female with chronic bilateral 1st and
2nd finger numbness, but more recent onset 3rd and sometimes 4th
finger symptoms in both hands. No known injury.

EXAM:
MRI CERVICAL SPINE WITHOUT CONTRAST
TECHNIQUE: Multiplanar, multisequence MR imaging of the cervical spine was
performed. No intravenous contrast was administered.

[Series 2: T2 · sagittal · 3.0mm · 0.43mm/px · 6 of 14 slices shown (1 of 2)]
[im 1/14]
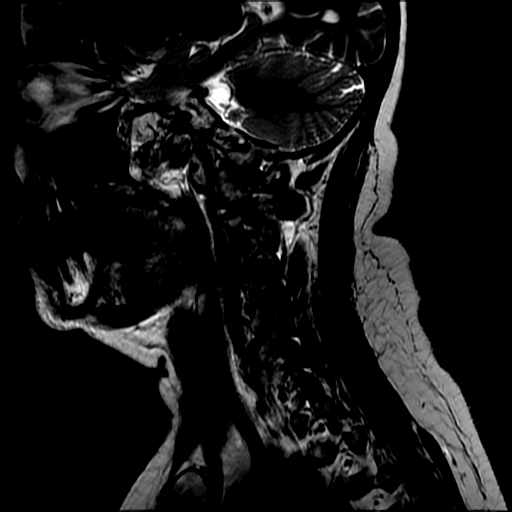
[im 3/14]
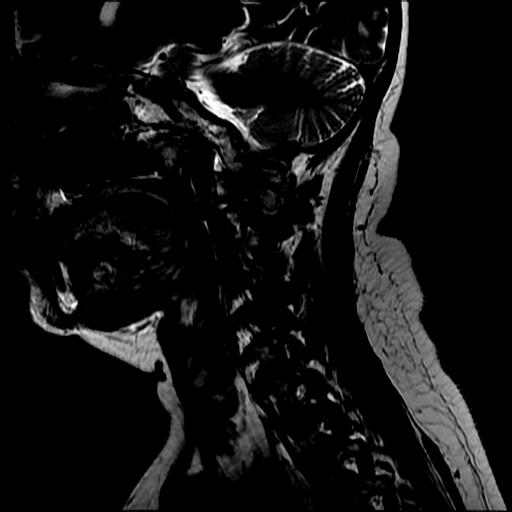
[im 6/14]
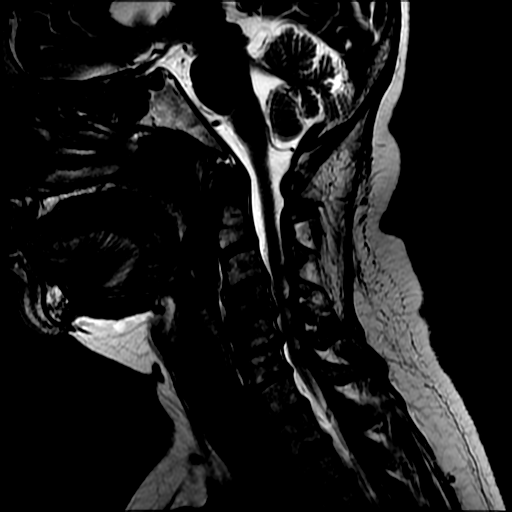
[im 8/14]
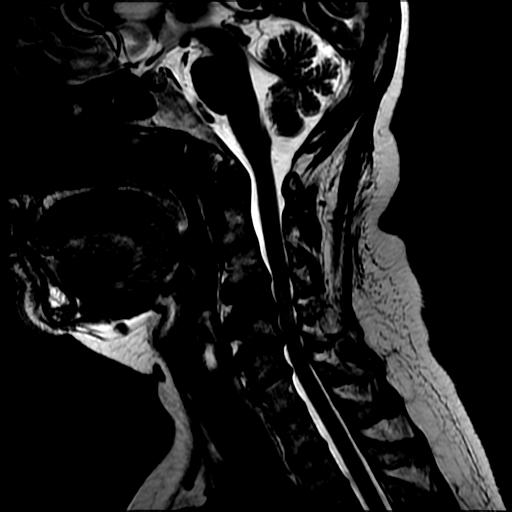
[im 11/14]
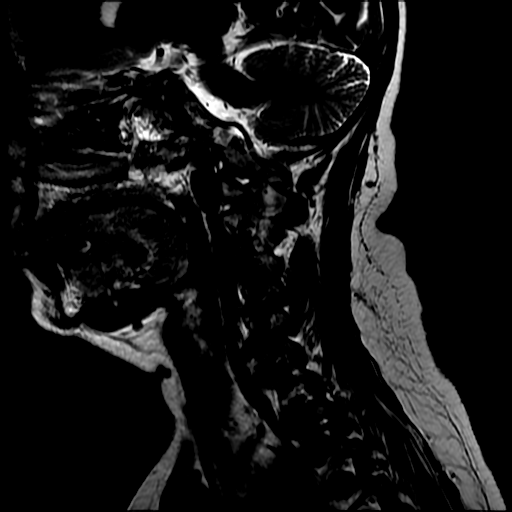
[im 14/14]
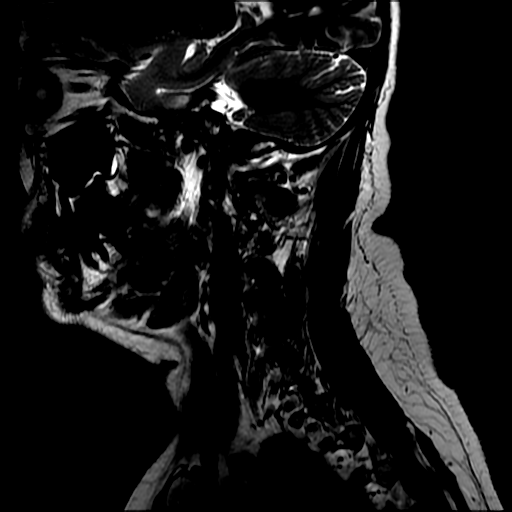

[Series 3: FLAIR · sagittal · 3.0mm · 0.43mm/px · 3 of 14 slices shown]
[im 3/14]
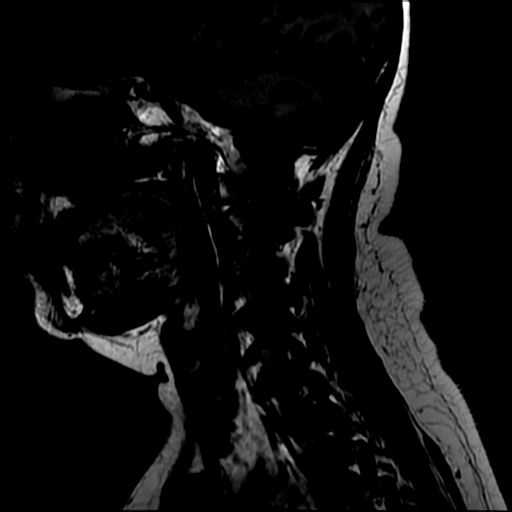
[im 8/14]
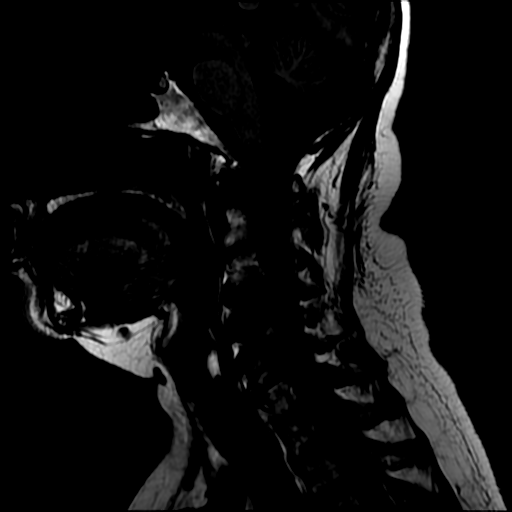
[im 14/14]
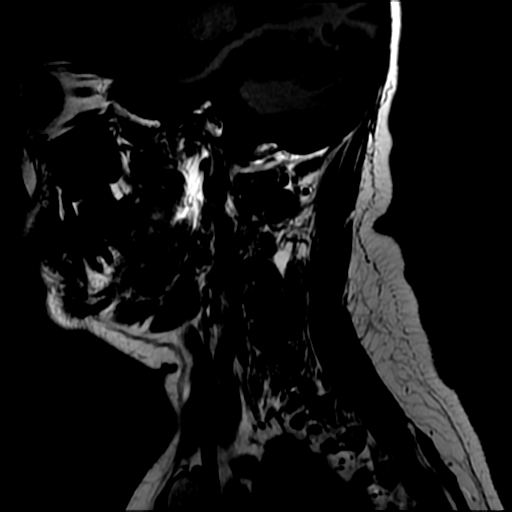

[Series 4: STIR · sagittal · 3.0mm · 0.43mm/px · 3 of 14 slices shown]
[im 3/14]
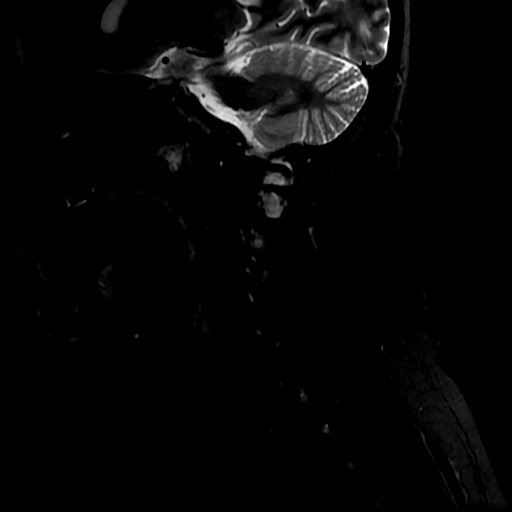
[im 8/14]
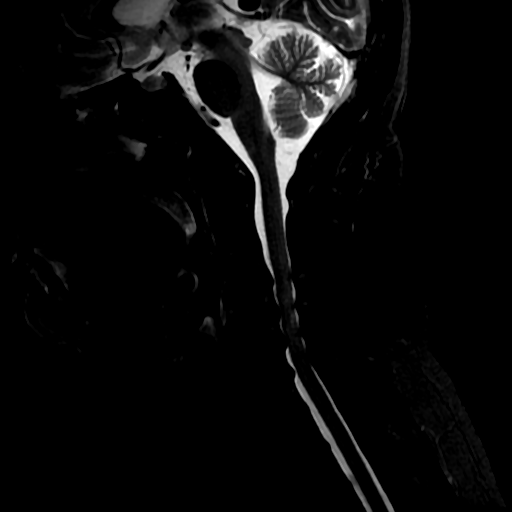
[im 14/14]
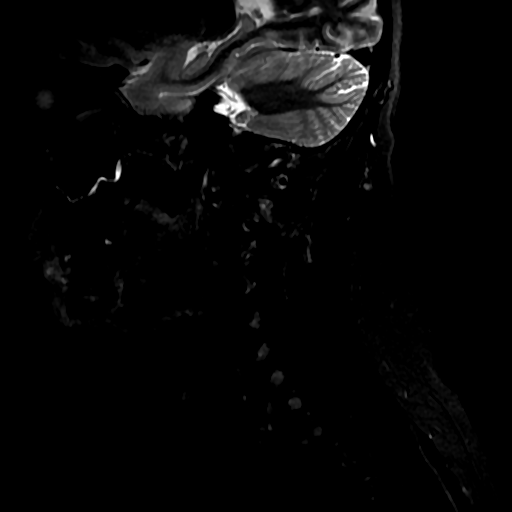

[Series 6: T2 · axial · 3.0mm · 0.35mm/px · z∈[-130,-41]mm · 5 of 33 slices shown (2 of 2)]
[im 1/33]
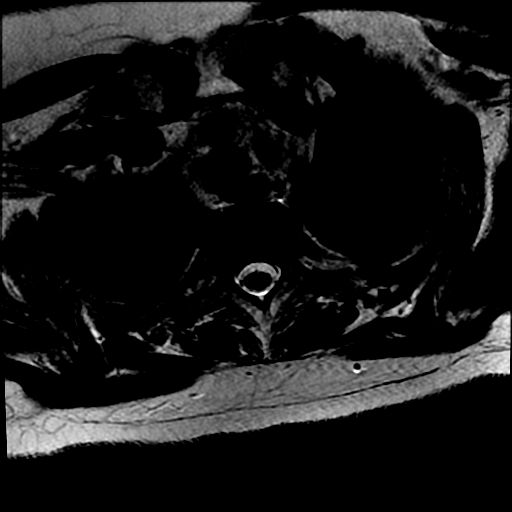
[im 5/33]
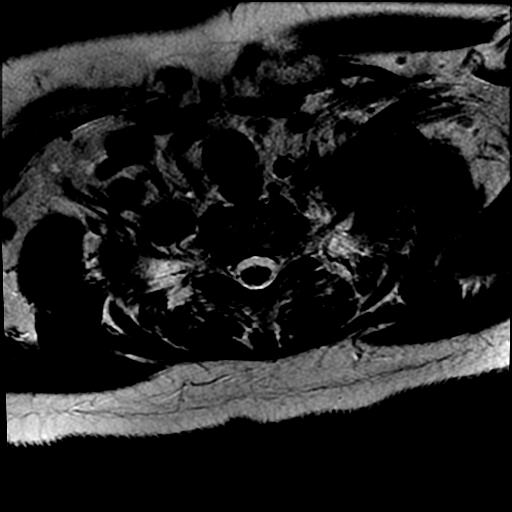
[im 10/33]
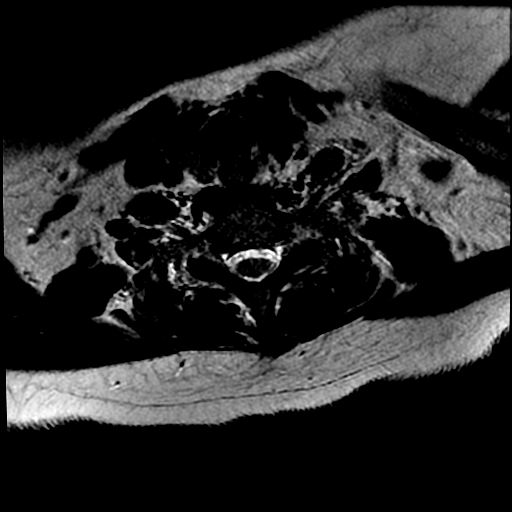
[im 18/33]
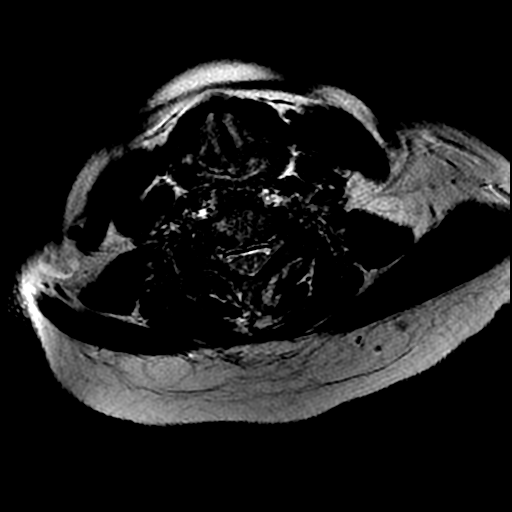
[im 28/33]
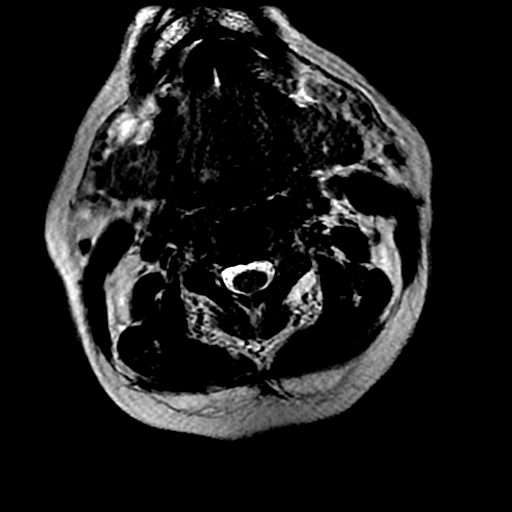

[17 of 48 positions shown; findings below may reference images not displayed]

FINDINGS: Alignment: Chronic straightening of cervical lordosis. Subtle
retrolisthesis from C3-C4 through C5-C6 is stable.

Vertebrae: Chronic degenerative vertebral marrow changes C4 through
C6. No marrow edema or evidence of acute osseous abnormality.

Cord: Subtle chronic abnormal cervical spinal cord signal on
sagittal STIR imaging from the C4 through superior C6 cord levels.
On axial images focal T2 hyperintensity in the central left hemi
cord at C4-C5 has progressed since 0035 (series 6, image 22) most
suggestive of myelomalacia. There is probably bilateral similar
myelomalacia within the dorsal cord at C5-C6 (series 6, image 18).
Above C4 and below C6 the spinal cord signal and morphology is
normal.

Posterior Fossa, vertebral arteries, paraspinal tissues: Negative
visible brain parenchyma. Preserved major vascular flow voids in the
neck. Negative neck soft tissues and visible lung apices.

Disc levels:

C2-C3:  Mild foraminal endplate spurring.  No stenosis.

C3-C4: Mild retrolisthesis with disc space loss and rightward
circumferential disc bulge with endplate spurring. Broad-based
posterior component (series 6, image 24) with mild facet
hypertrophy. Spinal stenosis with up to mild spinal cord mass effect
has increased since 0035 greater on the right. Moderate left and
severe right C4 foraminal stenosis appears stable.

C4-C5: Disc space loss with circumferential disc osteophyte complex.
Broad-based posterior and biforaminal involvement. Mild facet and
ligament flavum hypertrophy. Spinal stenosis with mild spinal cord
mass effect. Severe bilateral C5 foraminal stenosis. This level
appears stable.

C5-C6: Disc space loss with circumferential disc osteophyte complex.
Broad-based posterior and biforaminal involvement. Mild to moderate
facet and ligament flavum hypertrophy. Spinal stenosis with mild
spinal cord mass effect (series 6, image 17). Moderate to severe
left and severe right C6 foraminal stenosis. This level appears
stable.

C6-C7: Disc space loss with circumferential disc osteophyte complex.
Broad-based posterior and biforaminal involvement. Mild spinal
stenosis with no cord mass effect. Moderate to severe bilateral C7
foraminal stenosis. This level has mildly progressed.

C7-T1: Small lobulated paracentral disc bulging or protrusions with
no spinal stenosis. Mild facet hypertrophy. Borderline to mild
bilateral C8 foraminal stenosis. This level is stable.

No upper thoracic spinal stenosis despite multilevel disc bulging
with endplate spurring. There is mild multilevel upper thoracic
bilateral foraminal stenosis.
IMPRESSION: 1. Chronic degenerative spinal stenosis with mild spinal cord mass
effect at C4-C5 and C5-C6. Myelomalacia suspected within the left
hemi-cord at the former, and within the bilateral cord at the
latter. Chronic moderate to severe bilateral foraminal stenosis at
both levels.
2. Progress C3-C4 degeneration since 0035 with mild spinal stenosis
and cord mass effect. Moderate left and severe right C4 foraminal
stenosis appears stable.
3. Progressed mild spinal stenosis at C6-C7 without cord mass
effect. Moderate to severe bilateral C7 foraminal stenosis.

## 2019-02-02 ENCOUNTER — Encounter: Payer: Self-pay | Admitting: Internal Medicine

## 2019-02-03 LAB — NOVEL CORONAVIRUS, NAA: SARS-CoV-2, NAA: NOT DETECTED

## 2019-02-06 ENCOUNTER — Encounter: Payer: Self-pay | Admitting: Internal Medicine

## 2019-02-06 LAB — GASTROINTESTINAL PATHOGEN PANEL PCR
C. difficile Tox A/B, PCR: UNDETERMINED — AB
Campylobacter, PCR: UNDETERMINED — AB
Cryptosporidium, PCR: UNDETERMINED — AB
E coli (ETEC) LT/ST PCR: UNDETERMINED — AB
E coli (STEC) stx1/stx2, PCR: UNDETERMINED — AB
E coli 0157, PCR: UNDETERMINED — AB
Giardia lamblia, PCR: UNDETERMINED — AB
Norovirus, PCR: UNDETERMINED — AB
Rotavirus A, PCR: UNDETERMINED — AB
Salmonella, PCR: UNDETERMINED — AB
Shigella, PCR: UNDETERMINED — AB

## 2019-03-04 ENCOUNTER — Other Ambulatory Visit: Payer: Self-pay | Admitting: Neurosurgery

## 2019-03-04 DIAGNOSIS — M5440 Lumbago with sciatica, unspecified side: Secondary | ICD-10-CM

## 2019-03-05 ENCOUNTER — Other Ambulatory Visit: Payer: Self-pay

## 2019-03-05 ENCOUNTER — Encounter: Payer: Self-pay | Admitting: Internal Medicine

## 2019-03-05 MED ORDER — DULOXETINE HCL 60 MG PO CPEP: 60.0000 mg | ORAL_CAPSULE | Freq: Every day | ORAL | 0 refills | Status: DC

## 2019-03-05 MED ORDER — PANTOPRAZOLE SODIUM 40 MG PO TBEC: 40.0000 mg | DELAYED_RELEASE_TABLET | Freq: Every day | ORAL | 0 refills | Status: DC

## 2019-03-05 MED FILL — PANTOPRAZOLE SOD DR 40 MG T: 40 | 30 days supply | Qty: 30 | Fill #0

## 2019-03-05 MED FILL — DULoxetine HCL 60 MG CPEP: 60 | 30 days supply | Qty: 30 | Fill #0

## 2019-03-07 ENCOUNTER — Encounter: Payer: Self-pay | Admitting: Internal Medicine

## 2019-03-07 ENCOUNTER — Other Ambulatory Visit: Payer: Self-pay

## 2019-03-07 MED ORDER — ONDANSETRON 4 MG PO TBDP: 4.0000 mg | ORAL_TABLET | Freq: Three times a day (TID) | ORAL | 0 refills | Status: DC | PRN

## 2019-03-07 MED FILL — ONDANSETRON ODT 4 MG TABLET: 4 | 10 days supply | Qty: 30 | Fill #0

## 2019-03-13 NOTE — Patient Instructions (Addendum)
Take calcium citrate not calcium carbonate.    Tests ordered today. Your results will be released to [place] (or called to you) after review.  If any changes need to be made, you will be notified at that same time.  All other Health Maintenance issues reviewed.   All recommended immunizations and age-appropriate screenings are up-to-date or discussed.  No immunization administered today.   Medications reviewed and updated.  Changes include :   None   Your prescription(s) have been submitted to your pharmacy. Please take as directed and contact our office if you believe you are having problem(s) with the medication(s).   Please followup in 1 year     Health Maintenance, Female Adopting a healthy lifestyle and getting preventive care are important in promoting health and wellness. Ask your health care provider about:  The right schedule for you to have regular tests and exams.  Things you can do on your own to prevent diseases and keep yourself healthy. What should I know about diet, weight, and exercise? Eat a healthy diet   Eat a diet that includes plenty of vegetables, fruits, low-fat dairy products, and lean protein.  Do not eat a lot of foods that are high in solid fats, added sugars, or sodium. Maintain a healthy weight Body mass index (BMI) is used to identify weight problems. It estimates body fat based on height and weight. Your health care provider can help determine your BMI and help you achieve or maintain a healthy weight. Get regular exercise Get regular exercise. This is one of the most important things you can do for your health. Most adults should:  Exercise for at least 150 minutes each week. The exercise should increase your heart rate and make you sweat (moderate-intensity exercise).  Do strengthening exercises at least twice a week. This is in addition to the moderate-intensity exercise.  Spend less time sitting. Even light physical activity can be  beneficial. Watch cholesterol and blood lipids Have your blood tested for lipids and cholesterol at 64 years of age, then have this test every 5 years. Have your cholesterol levels checked more often if:  Your lipid or cholesterol levels are high.  You are older than 64 years of age.  You are at high risk for heart disease. What should I know about cancer screening? Depending on your health history and family history, you may need to have cancer screening at various ages. This may include screening for:  Breast cancer.  Cervical cancer.  Colorectal cancer.  Skin cancer.  Lung cancer. What should I know about heart disease, diabetes, and high blood pressure? Blood pressure and heart disease  High blood pressure causes heart disease and increases the risk of stroke. This is more likely to develop in people who have high blood pressure readings, are of African descent, or are overweight.  Have your blood pressure checked: ? Every 3-5 years if you are 60-49 years of age. ? Every year if you are 12 years old or older. Diabetes Have regular diabetes screenings. This checks your fasting blood sugar level. Have the screening done:  Once every three years after age 23 if you are at a normal weight and have a low risk for diabetes.  More often and at a younger age if you are overweight or have a high risk for diabetes. What should I know about preventing infection? Hepatitis B If you have a higher risk for hepatitis B, you should be screened for this virus. Talk with your  health care provider to find out if you are at risk for hepatitis B infection. Hepatitis C Testing is recommended for:  Everyone born from [birth year] through 1966.  Anyone with known risk factors for hepatitis C. Sexually transmitted infections (STIs)  Get screened for STIs, including gonorrhea and chlamydia, if: ? You are sexually active and are younger than 64 years of age. ? You are older than 64 years of age and  your health care provider tells you that you are at risk for this type of infection. ? Your sexual activity has changed since you were last screened, and you are at increased risk for chlamydia or gonorrhea. Ask your health care provider if you are at risk.  Ask your health care provider about whether you are at high risk for HIV. Your health care provider may recommend a prescription medicine to help prevent HIV infection. If you choose to take medicine to prevent HIV, you should first get tested for HIV. You should then be tested every 3 months for as long as you are taking the medicine. Pregnancy  If you are about to stop having your period (premenopausal) and you may become pregnant, seek counseling before you get pregnant.  Take 400 to 800 micrograms (mcg) of folic acid every day if you become pregnant.  Ask for birth control (contraception) if you want to prevent pregnancy. Osteoporosis and menopause Osteoporosis is a disease in which the bones lose minerals and strength with aging. This can result in bone fractures. If you are 51 years old or older, or if you are at risk for osteoporosis and fractures, ask your health care provider if you should:  Be screened for bone loss.  Take a calcium or vitamin D supplement to lower your risk of fractures.  Be given hormone replacement therapy (HRT) to treat symptoms of menopause. Follow these instructions at home: Lifestyle  Do not use any products that contain nicotine or tobacco, such as cigarettes, e-cigarettes, and chewing tobacco. If you need help quitting, ask your health care provider.  Do not use street drugs.  Do not share needles.  Ask your health care provider for help if you need support or information about quitting drugs. Alcohol use  Do not drink alcohol if: ? Your health care provider tells you not to drink. ? You are pregnant, may be pregnant, or are planning to become pregnant.  If you drink alcohol: ? Limit how  much you use to 0-1 drink a day. ? Limit intake if you are breastfeeding.  Be aware of how much alcohol is in your drink. In the [place] one drink equals one 12 oz bottle of beer (355 mL), one 5 oz glass of wine (148 mL), or one 1 oz glass of hard liquor (44 mL). General instructions  Schedule regular health, dental, and eye exams.  Stay current with your vaccines.  Tell your health care provider if: ? You often feel depressed. ? You have ever been abused or do not feel safe at home. Summary  Adopting a healthy lifestyle and getting preventive care are important in promoting health and wellness.  Follow your health care provider's instructions about healthy diet, exercising, and getting tested or screened for diseases.  Follow your health care provider's instructions on monitoring your cholesterol and blood pressure. This information is not intended to replace advice given to you by your health care provider. Make sure you discuss any questions you have with your health care provider. Document Released: 01/23/2011 Document Revised:  07/03/2018 Document Reviewed: 07/03/2018 Elsevier Patient Education  [place].

## 2019-03-13 NOTE — Progress Notes (Signed)
Subjective:    Patient ID: Sarah Mullen, female    DOB: 1956/05/20, 63 y.o.   MRN: [id number]  HPI She is here for a physical exam.   She has lost 15 lbs.  She feels better and has more energy.  She did this with changes in her diet.  She is not exercising regularly, but plans on starting regular exercise.  She still has lower back pain and will follow up with her surgeon.   She takes the cymbalta primarily for pain.    Medications and allergies reviewed with patient and updated if appropriate.  Patient Active Problem List   Diagnosis Date Noted  . Diarrhea 01/29/2019  . Left knee pain 01/17/2019  . Spinal stenosis in cervical region 03/27/2018  . Testosterone deficiency 08/22/2017  . Hyperlipidemia 08/22/2017  . Chronic lower back pain 08/22/2017  . Cervical myelopathy ([place]) 05/15/2016  . Vitamin D deficiency 05/15/2016  . Prediabetes 05/14/2016  . Fatty liver 03/09/2016  . Positive TB test 05/28/2012  . GERD (gastroesophageal reflux disease) 11/21/2010  . Dyshidrotic eczema 11/21/2010    Current Outpatient Medications on File Prior to Visit  Medication Sig Dispense Refill  . aspirin EC 81 MG tablet Take 81 mg by mouth at bedtime.     Marland Kitchen aspirin-acetaminophen-caffeine (EXCEDRIN MIGRAINE) 250-250-65 MG tablet Take 2 tablets by mouth daily as needed for headache or migraine.    . cholecalciferol (VITAMIN D) 1000 units tablet Take 3,000 Units by mouth daily.    Marland Kitchen estradiol (VIVELLE-DOT) 0.1 MG/24HR patch APPLY 1 PATCH TOPICALLY TWICE A WEEK 24 patch 0  . fluticasone (FLONASE) 50 MCG/ACT nasal spray Place 1 spray into both nostrils daily as needed for allergies or rhinitis.    Marland Kitchen loratadine (CLARITIN) 10 MG tablet Take 10 mg by mouth as needed.     . naproxen sodium (ALEVE) 220 MG tablet Take 220 mg by mouth as needed.    . NONFORMULARY OR COMPOUNDED ITEM Testosterone HRT (3ML) 1% cream, Apply 1/2 ml topically every morning as directed.  #60 grams 1 each 2  . [DISCONTINUED]  progesterone (PROMETRIUM) 200 MG capsule Take 200 mg by mouth daily.       No current facility-administered medications on file prior to visit.     Past Medical History:  Diagnosis Date  . Allergic rhinitis, cause unspecified   . Blood transfusion    iron transfusion- [place]-2009  . Chronic headaches   . Depression   . Diverticulitis   . GERD (gastroesophageal reflux disease)   . Heart murmur    diag with echo-physiologic murmur; trace MR, trivial, early systolic MR by 40/98/11 echo ([facility])  . History of sinus tachycardia    07/04/07 Holter monitor: 69-154, average HR 95 bpm. Occ PACs, 4 runs probable atrial tachycardia  . Menometrorrhagia 11/21/2010  . Migraines   . Pre-diabetes     Past Surgical History:  Procedure Laterality Date  . ABDOMINAL HYSTERECTOMY  09/2011  . ANTERIOR CERVICAL DECOMP/DISCECTOMY FUSION N/A 03/27/2018   Procedure: Anteroir Cervical Decrompression Fusion - Cervical Three - Cervical Four - Cervical Four - Cervical Five - Cervical Five - Cervical Six;  Surgeon: Kary Kos, MD;  Location: [facility];  Service: Neurosurgery;  Laterality: N/A;  Anteroir Cervical Decrompression Fusion - Cervical Three - Cervical Four - Cervical Four - Cervical Five - Cervical Five - Cervical Six  . BREAST BIOPSY     right breast/benign  . [place]  WHEN pregnant  . [place]   1 time  . CYSTOSCOPY  10/17/2011   Procedure: CYSTOSCOPY;  Surgeon: Megan Salon, MD;  Location: [facility];  Service: Gynecology;;  . DILATION AND CURETTAGE OF UTERUS    . TONSILLECTOMY      Social History   Socioeconomic History  . Marital status: Single    Spouse name: Not on file  . Number of children: Not on file  . Years of education: Not on file  . Highest education level: Not on file  Occupational History  . Not on file  Social Needs  . Financial resource strain: Not on file  . Food insecurity    Worry: Not on file    Inability: Not on file  .  Transportation needs    Medical: Not on file    Non-medical: Not on file  Tobacco Use  . Smoking status: Former Smoker    Types: Cigarettes    Quit date: 10/09/2001    Years since quitting: 17.4  . Smokeless tobacco: Never Used  Substance and Sexual Activity  . Alcohol use: No    Alcohol/week: 0.0 standard drinks  . Drug use: No  . Sexual activity: Yes    Partners: Male    Birth control/protection: Surgical    Comment: TLH/BSO  Lifestyle  . Physical activity    Days per week: Not on file    Minutes per session: Not on file  . Stress: Not on file  Relationships  . Social Herbalist on phone: Not on file    Gets together: Not on file    Attends religious service: Not on file    Active member of club or organization: Not on file    Attends meetings of clubs or organizations: Not on file    Relationship status: Not on file  Other Topics Concern  . Not on file  Social History Narrative  . Not on file    Family History  Problem Relation Age of Onset  . Arthritis Mother   . Hyperlipidemia Mother   . Heart disease Mother   . Arthritis Father   . Lung cancer Father        smoker  . Heart disease Father   . Hypertension Father   . Diabetes Father   . Colon cancer Paternal Aunt   . CAD Paternal Grandmother   . Kidney disease Maternal Grandfather        ESRD on HD  . Kidney disease Maternal Grandmother        ESRD on HD  . Diabetes Brother   . Metabolic syndrome Sister   . Stomach cancer Neg Hx   . Rectal cancer Neg Hx   . Esophageal cancer Neg Hx     Review of Systems  Constitutional: Negative for chills and fever.  Eyes: Negative for visual disturbance.  Respiratory: Negative for cough, shortness of breath and wheezing.   Cardiovascular: Negative for chest pain, palpitations and leg swelling.  Gastrointestinal: Negative for abdominal pain, blood in stool, constipation, diarrhea and nausea.  Genitourinary: Negative for dysuria and hematuria.   Musculoskeletal: Positive for arthralgias (hip pain, knees) and back pain.  Skin: Negative for color change and rash.  Neurological: Positive for dizziness (occ with quick movements). Negative for headaches.  Psychiatric/Behavioral: Negative for dysphoric mood. The patient is not nervous/anxious.        Objective:   Vitals:   03/14/19 1450  BP: 118/68  Pulse: 82  Resp: 16  Temp: 98.9 F (37.2 C)  SpO2: 97%   Filed Weights   03/14/19 1450  Weight: 163 lb 12.8 oz (74.3 kg)   Body mass index is 30.95 kg/m.  BP Readings from Last 3 Encounters:  03/14/19 118/68  01/17/19 128/74  07/16/18 98/60    Wt Readings from Last 3 Encounters:  03/14/19 163 lb 12.8 oz (74.3 kg)  01/17/19 166 lb (75.3 kg)  07/16/18 177 lb (80.3 kg)     Physical Exam Constitutional: She appears well-developed and well-nourished. No distress.  HENT:  Head: Normocephalic and atraumatic.  Right Ear: External ear normal. Normal ear canal and TM Left Ear: External ear normal.  Normal ear canal and TM Mouth/Throat: Oropharynx is clear and moist.  Eyes: Conjunctivae and EOM are normal.  Neck: Neck supple. No tracheal deviation present. No thyromegaly present.  No carotid bruit  Cardiovascular: Normal rate, regular rhythm and normal heart sounds.   No murmur heard.  No edema. Pulmonary/Chest: Effort normal and breath sounds normal. No respiratory distress. She has no wheezes. She has no rales.  Breast: deferred   Abdominal: Soft. She exhibits no distension. There is no tenderness.  Lymphadenopathy: She has no cervical adenopathy.  Skin: Skin is warm and dry. She is not diaphoretic.  Psychiatric: She has a normal mood and affect. Her behavior is normal.        Assessment & Plan:   Physical exam: Screening blood work ordered Immunizations   Recommended flu-will get through work, discussed shingrix Colonoscopy   Up to date  Mammogram   Up to date  Gyn   Up to date  Dexa    Up to date  Eye exams   Will schedule Exercise    none Weight  Has lost weight and working on weight loss Skin no concerns Substance abuse  none  See Problem List for Assessment and Plan of chronic medical problems.

## 2019-03-14 ENCOUNTER — Encounter: Payer: Self-pay | Admitting: Internal Medicine

## 2019-03-14 ENCOUNTER — Ambulatory Visit (INDEPENDENT_AMBULATORY_CARE_PROVIDER_SITE_OTHER): Payer: No Typology Code available for payment source | Admitting: Internal Medicine

## 2019-03-14 ENCOUNTER — Other Ambulatory Visit: Payer: Self-pay

## 2019-03-14 ENCOUNTER — Ambulatory Visit (HOSPITAL_COMMUNITY): Payer: No Typology Code available for payment source

## 2019-03-14 VITALS — BP 118/68 | HR 82 | Temp 98.9°F | Resp 16 | Ht 61.0 in | Wt 163.8 lb

## 2019-03-14 DIAGNOSIS — E559 Vitamin D deficiency, unspecified: Secondary | ICD-10-CM

## 2019-03-14 DIAGNOSIS — Z0000 Encounter for general adult medical examination without abnormal findings: Secondary | ICD-10-CM | POA: Diagnosis not present

## 2019-03-14 DIAGNOSIS — F32A Depression, unspecified: Secondary | ICD-10-CM

## 2019-03-14 DIAGNOSIS — G959 Disease of spinal cord, unspecified: Secondary | ICD-10-CM

## 2019-03-14 DIAGNOSIS — R7303 Prediabetes: Secondary | ICD-10-CM | POA: Diagnosis not present

## 2019-03-14 DIAGNOSIS — M545 Low back pain, unspecified: Secondary | ICD-10-CM

## 2019-03-14 DIAGNOSIS — G8929 Other chronic pain: Secondary | ICD-10-CM

## 2019-03-14 DIAGNOSIS — F329 Major depressive disorder, single episode, unspecified: Secondary | ICD-10-CM | POA: Diagnosis not present

## 2019-03-14 DIAGNOSIS — K219 Gastro-esophageal reflux disease without esophagitis: Secondary | ICD-10-CM

## 2019-03-14 DIAGNOSIS — E7849 Other hyperlipidemia: Secondary | ICD-10-CM

## 2019-03-14 MED ORDER — CYCLOBENZAPRINE HCL 10 MG PO TABS: 5.0000 mg | ORAL_TABLET | Freq: Every day | ORAL | 0 refills | Status: DC

## 2019-03-14 MED ORDER — PANTOPRAZOLE SODIUM 40 MG PO TBEC: 40.0000 mg | DELAYED_RELEASE_TABLET | Freq: Every day | ORAL | 1 refills | Status: DC

## 2019-03-14 MED ORDER — DULOXETINE HCL 60 MG PO CPEP: 60.0000 mg | ORAL_CAPSULE | Freq: Every day | ORAL | 1 refills | Status: DC

## 2019-03-14 MED FILL — CYCLOBENZAPRINE 10 MG TAB: 10 | 20 days supply | Qty: 20 | Fill #0

## 2019-03-14 NOTE — Assessment & Plan Note (Addendum)
Taking cymbalta daily, which helps Continue current dose Takes flexeril on occasion for back and hip Will continue Will f/u with Dr Saintclair Halsted - will have an MRI

## 2019-03-14 NOTE — Assessment & Plan Note (Signed)
GERD controlled Continue daily medication  

## 2019-03-15 NOTE — Assessment & Plan Note (Signed)
Diet controlled Check lipid panel, CMP, TSH Has lost weight and will continue to lose weight Continue healthy diet Will start regular exercise

## 2019-03-15 NOTE — Assessment & Plan Note (Signed)
Has not been taking vitamin D regularly-she has started again and we will recheck this in the future

## 2019-03-15 NOTE — Assessment & Plan Note (Signed)
A1c

## 2019-03-15 NOTE — Assessment & Plan Note (Signed)
Has recovered well from surgery Denies neck pain or radiculopathy

## 2019-03-20 ENCOUNTER — Ambulatory Visit (HOSPITAL_COMMUNITY)
Admission: RE | Admit: 2019-03-20 | Discharge: 2019-03-20 | Disposition: A | Discharge status: Home / Self Care | Payer: No Typology Code available for payment source | Source: Ambulatory Visit | Attending: Neurosurgery | Admitting: Neurosurgery

## 2019-03-20 DIAGNOSIS — M5440 Lumbago with sciatica, unspecified side: Secondary | ICD-10-CM | POA: Insufficient documentation

## 2019-03-27 ENCOUNTER — Other Ambulatory Visit: Payer: Self-pay

## 2019-03-27 ENCOUNTER — Ambulatory Visit: Payer: No Typology Code available for payment source | Admitting: Obstetrics & Gynecology

## 2019-04-01 ENCOUNTER — Ambulatory Visit (INDEPENDENT_AMBULATORY_CARE_PROVIDER_SITE_OTHER): Payer: No Typology Code available for payment source | Admitting: Obstetrics & Gynecology

## 2019-04-01 ENCOUNTER — Encounter: Payer: Self-pay | Admitting: Obstetrics & Gynecology

## 2019-04-01 ENCOUNTER — Other Ambulatory Visit: Payer: Self-pay

## 2019-04-01 VITALS — BP 118/70 | HR 76 | Temp 97.5°F | Resp 12 | Ht 60.25 in | Wt 165.0 lb

## 2019-04-01 DIAGNOSIS — Z01419 Encounter for gynecological examination (general) (routine) without abnormal findings: Secondary | ICD-10-CM | POA: Diagnosis not present

## 2019-04-01 DIAGNOSIS — R6882 Decreased libido: Secondary | ICD-10-CM | POA: Diagnosis not present

## 2019-04-01 DIAGNOSIS — N898 Other specified noninflammatory disorders of vagina: Secondary | ICD-10-CM

## 2019-04-01 NOTE — Progress Notes (Addendum)
63 y.o. G64P1 Single White or Caucasian female here for annual exam.  Doing well.  Still working and grateful for job.  Thinking about retiring.    Denies vaginal bleeding.    Had C3-4, C4-5, C5-6 fusion.  Mobility is good.  Pain is much improved.  Significant other having lumbar surgery next week.  Dr. Redmond Pulling.  He will be inpatient.    Lost about 15 pounds.    PCP:  Dr. Quay Burow.  Has blood work scheduled.    Patient's last menstrual period was 09/24/2011.          Sexually active: Yes.    The current method of family planning is status post hysterectomy.    Exercising: No.  The patient does not participate in regular exercise at present. Smoker:  no  Health Maintenance: Pap:  2011 Normal   History of abnormal Pap:  yes MMG:  01/13/19 Diagnostic Right BIRADS2:benign. F/u 1 year  Colonoscopy:  07/16/18 f/u 10 years  BMD:   02/05/15 normal  TDaP:  2017 Pneumonia vaccine(s):  none Shingrix:   none Hep C testing: 05/28/15 neg  Screening Labs: discuss if needed   reports that she quit smoking about 17 years ago. Her smoking use included cigarettes. She has never used smokeless tobacco. She reports that she does not drink alcohol or use drugs.  Past Medical History:  Diagnosis Date  . Allergic rhinitis, cause unspecified   . Blood transfusion    iron transfusion- [place]-2009  . Chronic headaches   . Depression   . Diverticulitis   . GERD (gastroesophageal reflux disease)   . Heart murmur    diag with echo-physiologic murmur; trace MR, trivial, early systolic MR by 123456 echo ([facility])  . History of sinus tachycardia    07/04/07 Holter monitor: 69-154, average HR 95 bpm. Occ PACs, 4 runs probable atrial tachycardia  . Menometrorrhagia 11/21/2010  . Migraines   . Pre-diabetes     Past Surgical History:  Procedure Laterality Date  . ABDOMINAL HYSTERECTOMY  09/2011  . ANTERIOR CERVICAL DECOMP/DISCECTOMY FUSION N/A 03/27/2018   Procedure: Anteroir Cervical  Decrompression Fusion - Cervical Three - Cervical Four - Cervical Four - Cervical Five - Cervical Five - Cervical Six;  Surgeon: Kary Kos, MD;  Location: [facility];  Service: Neurosurgery;  Laterality: N/A;  Anteroir Cervical Decrompression Fusion - Cervical Three - Cervical Four - Cervical Four - Cervical Five - Cervical Five - Cervical Six  . BREAST BIOPSY     right breast/benign  . CERCLAGE REMOVAL  1986   WHEN pregnant  . [place]   1 time  . CYSTOSCOPY  10/17/2011   Procedure: CYSTOSCOPY;  Surgeon: Megan Salon, MD;  Location: [facility];  Service: Gynecology;;  . DILATION AND CURETTAGE OF UTERUS    . TONSILLECTOMY      Current Outpatient Medications  Medication Sig Dispense Refill  . aspirin EC 81 MG tablet Take 81 mg by mouth at bedtime.     Marland Kitchen aspirin-acetaminophen-caffeine (EXCEDRIN MIGRAINE) 250-250-65 MG tablet Take 2 tablets by mouth daily as needed for headache or migraine.    . cholecalciferol (VITAMIN D) 1000 units tablet Take 3,000 Units by mouth daily.    . cyclobenzaprine (FLEXERIL) 10 MG tablet Take 0.5-1 tablets (5-10 mg total) by mouth at bedtime. 20 tablet 0  . DULoxetine (CYMBALTA) 60 MG capsule Take 1 capsule (60 mg total) by mouth daily. 90 capsule 1  . estradiol (VIVELLE-DOT) 0.1 MG/24HR  patch APPLY 1 PATCH TOPICALLY TWICE A WEEK 24 patch 0  . fluticasone (FLONASE) 50 MCG/ACT nasal spray Place 1 spray into both nostrils daily as needed for allergies or rhinitis.    Marland Kitchen loratadine (CLARITIN) 10 MG tablet Take 10 mg by mouth as needed.     . naproxen sodium (ALEVE) 220 MG tablet Take 220 mg by mouth as needed.    . NONFORMULARY OR COMPOUNDED ITEM Testosterone HRT (3ML) 1% cream, Apply 1/2 ml topically every morning as directed.  #60 grams 1 each 2  . pantoprazole (PROTONIX) 40 MG tablet Take 1 tablet (40 mg total) by mouth daily. 90 tablet 1   No current facility-administered medications for this visit.     Family History  Problem Relation Age of Onset  .  Arthritis Mother   . Hyperlipidemia Mother   . Heart disease Mother   . Arthritis Father   . Lung cancer Father        smoker  . Heart disease Father   . Hypertension Father   . Diabetes Father   . Colon cancer Paternal Aunt   . CAD Paternal Grandmother   . Kidney disease Maternal Grandfather        ESRD on HD  . Kidney disease Maternal Grandmother        ESRD on HD  . Diabetes Brother   . Metabolic syndrome Sister   . Stomach cancer Neg Hx   . Rectal cancer Neg Hx   . Esophageal cancer Neg Hx     Review of Systems  Constitutional: Negative.   HENT: Negative.   Eyes: Negative.   Respiratory: Negative.   Cardiovascular: Negative.   Gastrointestinal: Negative.   Endocrine: Negative.   Genitourinary:       Vaginal itching Discharge   Musculoskeletal: Negative.   Skin: Negative.   Allergic/Immunologic: Negative.   Neurological: Negative.   Hematological: Negative.   Psychiatric/Behavioral: Negative.     Exam:   BP 118/70 (BP Location: Right Arm, Patient Position: Sitting, Cuff Size: Normal)   Pulse 76   Temp (!) 97.5 F (36.4 C) (Temporal)   Resp 12   Ht 5' 0.25" (1.53 m)   Wt 165 lb (74.8 kg)   LMP 09/24/2011   BMI 31.96 kg/m     Height: 5' 0.25" (153 cm)  Ht Readings from Last 3 Encounters:  04/01/19 5' 0.25" (1.53 m)  03/14/19 5\' 1"  (1.549 m)  07/16/18 5\' 1"  (1.549 m)    General appearance: alert, cooperative and appears stated age Head: Normocephalic, without obvious abnormality, atraumatic Neck: no adenopathy, supple, symmetrical, trachea midline and thyroid normal to inspection and palpation Lungs: clear to auscultation bilaterally Breasts: normal appearance, no masses or tenderness Heart: regular rate and rhythm Abdomen: soft, non-tender; bowel sounds normal; no masses,  no organomegaly Extremities: extremities normal, atraumatic, no cyanosis or edema Skin: Skin color, texture, turgor normal. No rashes or lesions Lymph nodes: Cervical,  supraclavicular, and axillary nodes normal. No abnormal inguinal nodes palpated Neurologic: Grossly normal   Pelvic: External genitalia:  no lesions              Urethra:  normal appearing urethra with no masses, tenderness or lesions              Bartholins and Skenes: normal                 Vagina: normal appearing vagina with normal color and discharge, no lesions  Cervix: absent              Pap taken: No. Bimanual Exam:  Uterus:  uterus absent              Adnexa: no mass, fullness, tenderness               Rectovaginal: Confirms               Anus:  normal sphincter tone, no lesions  Chaperone was present for exam.  A:  Well Woman with normal exam S/p TLH/BSO, on HRT H/O depression H/o decreased libido, using topical testosterone H/O low Vit D H/O fatty liver disease Vaginal discharge  P:   Mammogram guidelines reviewed.  Doing yearly 3D. Would like combined estrogen/testosterone 1% cream.   pap smear no indicated Colonoscopy is UTD Shingrix vaccination discussed Lab work is planned with Dr. Quay Burow Total testosterone ordered for one month once she restarts the topical testosterone Affirm testing pending Return annually or prn

## 2019-04-01 NOTE — Addendum Note (Signed)
Addended by: Megan Salon on: 04/01/2019 10:39 AM   Modules accepted: Orders

## 2019-04-02 LAB — VAGINITIS/VAGINOSIS, DNA PROBE
Candida Species: NEGATIVE
Gardnerella vaginalis: NEGATIVE
Trichomonas vaginosis: NEGATIVE

## 2019-04-07 MED FILL — PANTOPRAZOLE SOD DR 40 MG T: 40 | 90 days supply | Qty: 90 | Fill #0

## 2019-04-07 MED FILL — DULoxetine HCL 60 MG CPEP: 60 | 90 days supply | Qty: 90 | Fill #0

## 2019-04-07 MED FILL — CYCLOBENZAPRINE 10 MG TAB: 10 | 20 days supply | Qty: 20 | Fill #0

## 2019-04-09 ENCOUNTER — Other Ambulatory Visit: Payer: Self-pay | Admitting: Obstetrics & Gynecology

## 2019-04-09 MED ORDER — NONFORMULARY OR COMPOUNDED ITEM: 1 refills | Status: DC

## 2019-05-10 IMAGING — RF DG C-ARM 61-120 MIN
1 series · 1 of 1 positions shown · non-contrast
Comparison: Cervical spine MRI dated 12/19/2017

CLINICAL DATA: Anterior cervical decompression and fusion.

EXAM:
DG C-ARM 61-120 MIN; DG CERVICAL SPINE - 1 VIEW

[Series 1: run · 1 of 1 slices shown]
[im 1/1]
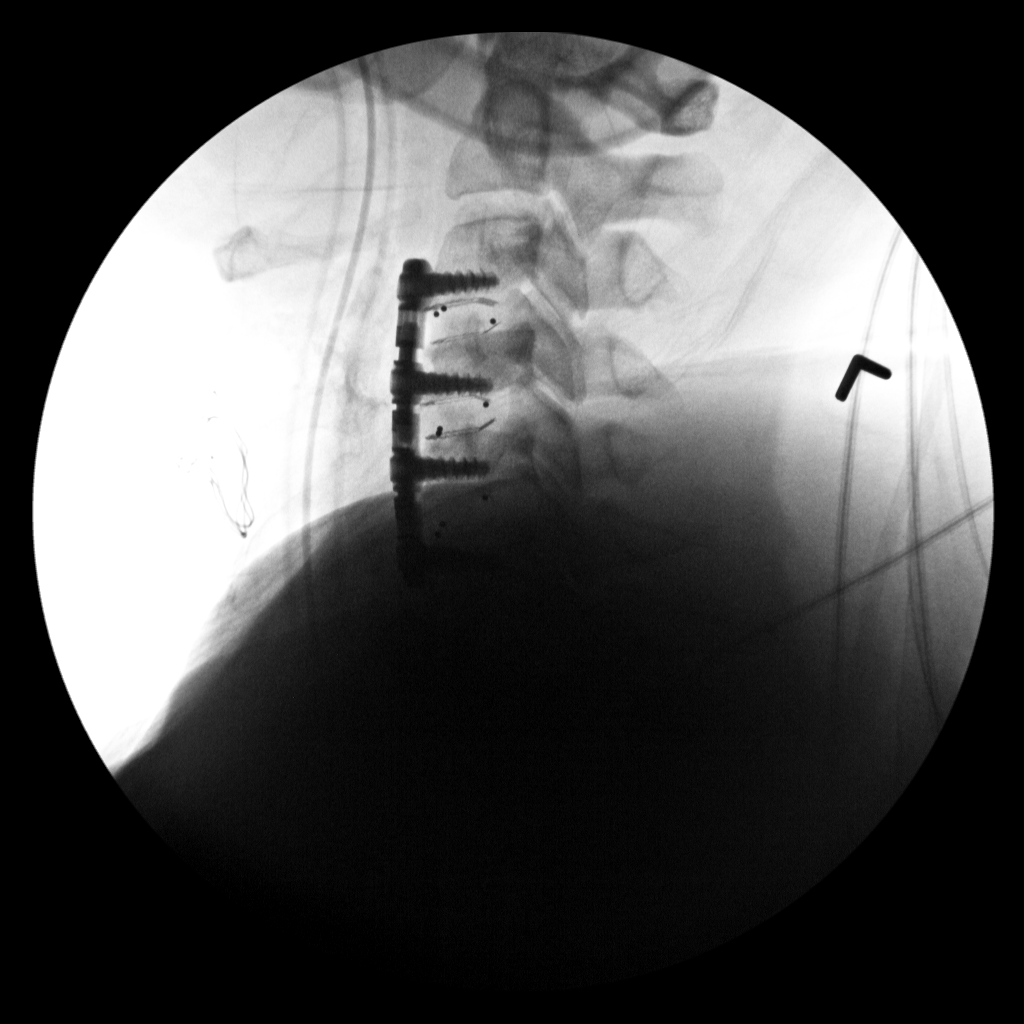

[1 of 1 positions shown; findings below may reference images not displayed]

FINDINGS: Lateral intraoperative radiograph of the cervical spine demonstrates
C3-C6 anterior cervical fusion. Normal alignment of the cervical
spine and the orthopedic hardware.

The patient is intubated.

Fluoroscopy time is recorded as 5 seconds.
IMPRESSION: C3-C6 anterior fusion.

## 2019-05-16 ENCOUNTER — Encounter: Payer: Self-pay | Admitting: Internal Medicine

## 2019-05-23 ENCOUNTER — Other Ambulatory Visit (INDEPENDENT_AMBULATORY_CARE_PROVIDER_SITE_OTHER): Payer: No Typology Code available for payment source

## 2019-05-23 DIAGNOSIS — Z0000 Encounter for general adult medical examination without abnormal findings: Secondary | ICD-10-CM | POA: Diagnosis not present

## 2019-05-23 DIAGNOSIS — R7303 Prediabetes: Secondary | ICD-10-CM

## 2019-05-23 DIAGNOSIS — E7849 Other hyperlipidemia: Secondary | ICD-10-CM

## 2019-05-23 LAB — CBC WITH DIFFERENTIAL/PLATELET
Basophils Absolute: 0.1 10*3/uL (ref 0.0–0.1)
Basophils Relative: 0.8 % (ref 0.0–3.0)
Eosinophils Absolute: 0.2 10*3/uL (ref 0.0–0.7)
Eosinophils Relative: 2.9 % (ref 0.0–5.0)
HCT: 41.9 % (ref 36.0–46.0)
Hemoglobin: 13.7 g/dL (ref 12.0–15.0)
Lymphocytes Relative: 30.8 % (ref 12.0–46.0)
Lymphs Abs: 2.6 10*3/uL (ref 0.7–4.0)
MCHC: 32.8 g/dL (ref 30.0–36.0)
MCV: 88.4 fl (ref 78.0–100.0)
Monocytes Absolute: 0.5 10*3/uL (ref 0.1–1.0)
Monocytes Relative: 6.3 % (ref 3.0–12.0)
Neutro Abs: 4.9 10*3/uL (ref 1.4–7.7)
Neutrophils Relative %: 59.2 % (ref 43.0–77.0)
Platelets: 314 10*3/uL (ref 150.0–400.0)
RBC: 4.74 Mil/uL (ref 3.87–5.11)
RDW: 13.8 % (ref 11.5–15.5)
WBC: 8.3 10*3/uL (ref 4.0–10.5)

## 2019-05-23 LAB — COMPREHENSIVE METABOLIC PANEL
ALT: 24 U/L (ref 0–35)
AST: 18 U/L (ref 0–37)
Albumin: 4.5 g/dL (ref 3.5–5.2)
Alkaline Phosphatase: 90 U/L (ref 39–117)
BUN: 21 mg/dL (ref 6–23)
CO2: 27 mEq/L (ref 19–32)
Calcium: 9.7 mg/dL (ref 8.4–10.5)
Chloride: 105 mEq/L (ref 96–112)
Creatinine, Ser: 0.76 mg/dL (ref 0.40–1.20)
GFR: 76.64 mL/min (ref >60.00)
Glucose, Bld: 126 mg/dL — ABNORMAL HIGH (ref 70–99)
Potassium: 4.1 mEq/L (ref 3.5–5.1)
Sodium: 141 mEq/L (ref 135–145)
Total Bilirubin: 0.3 mg/dL (ref 0.2–1.2)
Total Protein: 7.1 g/dL (ref 6.0–8.3)

## 2019-05-23 LAB — LIPID PANEL
Cholesterol: 160 mg/dL (ref 0–200)
HDL: 50.7 mg/dL (ref >39.00)
LDL Cholesterol: 72 mg/dL (ref 0–99)
NonHDL: 109.38
Total CHOL/HDL Ratio: 3
Triglycerides: 189 mg/dL — ABNORMAL HIGH (ref 0.0–149.0)
VLDL: 37.8 mg/dL (ref 0.0–40.0)

## 2019-05-23 LAB — HEMOGLOBIN A1C: Hgb A1c MFr Bld: 6.1 % (ref 4.6–6.5)

## 2019-05-23 LAB — TSH: TSH: 2.97 u[IU]/mL (ref 0.35–4.50)

## 2019-05-24 ENCOUNTER — Encounter: Payer: Self-pay | Admitting: Internal Medicine

## 2019-05-30 ENCOUNTER — Encounter: Payer: Self-pay | Admitting: Internal Medicine

## 2019-05-30 DIAGNOSIS — M25562 Pain in left knee: Secondary | ICD-10-CM

## 2019-05-30 DIAGNOSIS — M25551 Pain in right hip: Secondary | ICD-10-CM

## 2019-05-30 DIAGNOSIS — G8929 Other chronic pain: Secondary | ICD-10-CM

## 2019-06-02 ENCOUNTER — Telehealth: Payer: Self-pay

## 2019-06-02 NOTE — Telephone Encounter (Signed)
Called patient and scheduled her to see Dr. Tamala Julian.

## 2019-06-12 NOTE — Progress Notes (Signed)
[facility] [address], [place] [address] Phone: [phone] Subjective:   I Sarah Mullen am serving as a Education administrator for Dr. Hulan Saas.  This visit occurred during the SARS-CoV-2 public health emergency.  Safety protocols were in place, including screening questions prior to the visit, additional usage of staff PPE, and extensive cleaning of exam room while observing appropriate contact time as indicated for disinfecting solutions.    CC: Back pain, shoulder pain, hip pain  [id number]  Sarah Mullen is a 63 y.o. female coming in with complaint of bilateral knee and right hip pain. Patient states she can hear the knees click and pop. Recently had an MRI 02/2019. Neuro wants to do a nerve block. Pain radiates down the right leg.   Onset- chronic Location - groin, knee cap Duration-  Character- achy knees, burning/ tearing and achy hip pain  Aggravating factors- sharp directional changes  Reliving factors-  Therapies tried- ice, heat, topical, oral  Severity-  10/10 at its worse    MRI was independently visualized by me.  MRI did show at L4-L5 severe bilateral facet arthropathy and moderate spinal stenosis.  Past Medical History:  Diagnosis Date  . Allergic rhinitis, cause unspecified   . Blood transfusion    iron transfusion- [place]-2009  . Chronic headaches   . Depression   . Diverticulitis   . GERD (gastroesophageal reflux disease)   . Heart murmur    diag with echo-physiologic murmur; trace MR, trivial, early systolic MR by 123456 echo ([facility])  . History of sinus tachycardia    07/04/07 Holter monitor: 69-154, average HR 95 bpm. Occ PACs, 4 runs probable atrial tachycardia  . Menometrorrhagia 11/21/2010  . Migraines   . Pre-diabetes    Past Surgical History:  Procedure Laterality Date  . ABDOMINAL HYSTERECTOMY  09/2011  . ANTERIOR CERVICAL DECOMP/DISCECTOMY FUSION N/A 03/27/2018   Procedure: Anteroir Cervical  Decrompression Fusion - Cervical Three - Cervical Four - Cervical Four - Cervical Five - Cervical Five - Cervical Six;  Surgeon: Kary Kos, MD;  Location: [facility];  Service: Neurosurgery;  Laterality: N/A;  Anteroir Cervical Decrompression Fusion - Cervical Three - Cervical Four - Cervical Four - Cervical Five - Cervical Five - Cervical Six  . BREAST BIOPSY     right breast/benign  . CERCLAGE REMOVAL  1986   WHEN pregnant  . [place]   1 time  . CYSTOSCOPY  10/17/2011   Procedure: CYSTOSCOPY;  Surgeon: Megan Salon, MD;  Location: [facility];  Service: Gynecology;;  . DILATION AND CURETTAGE OF UTERUS    . TONSILLECTOMY     Social History   Socioeconomic History  . Marital status: Single    Spouse name: Not on file  . Number of children: Not on file  . Years of education: Not on file  . Highest education level: Not on file  Occupational History  . Not on file  Social Needs  . Financial resource strain: Not on file  . Food insecurity    Worry: Not on file    Inability: Not on file  . Transportation needs    Medical: Not on file    Non-medical: Not on file  Tobacco Use  . Smoking status: Former Smoker    Types: Cigarettes    Quit date: 10/09/2001    Years since quitting: 17.6  . Smokeless tobacco: Never Used  Substance and Sexual Activity  . Alcohol  use: No    Alcohol/week: 0.0 standard drinks  . Drug use: No  . Sexual activity: Yes    Partners: Male    Birth control/protection: Surgical    Comment: TLH/BSO  Lifestyle  . Physical activity    Days per week: Not on file    Minutes per session: Not on file  . Stress: Not on file  Relationships  . Social Herbalist on phone: Not on file    Gets together: Not on file    Attends religious service: Not on file    Active member of club or organization: Not on file    Attends meetings of clubs or organizations: Not on file    Relationship status: Not on file  Other Topics Concern  . Not on file  Social  History Narrative  . Not on file   Allergies  Allergen Reactions  . Kiwi Extract     Throat itching   . Contrast Media [Iodinated Diagnostic Agents] Rash  . Keflex [Cephalexin] Rash   Family History  Problem Relation Age of Onset  . Arthritis Mother   . Hyperlipidemia Mother   . Heart disease Mother   . Arthritis Father   . Lung cancer Father        smoker  . Heart disease Father   . Hypertension Father   . Diabetes Father   . Colon cancer Paternal Aunt   . CAD Paternal Grandmother   . Kidney disease Maternal Grandfather        ESRD on HD  . Kidney disease Maternal Grandmother        ESRD on HD  . Diabetes Brother   . Metabolic syndrome Sister   . Stomach cancer Neg Hx   . Rectal cancer Neg Hx   . Esophageal cancer Neg Hx     Current Outpatient Medications (Endocrine & Metabolic):  .  estradiol (VIVELLE-DOT) 0.1 MG/24HR patch, APPLY 1 PATCH TOPICALLY TWICE A WEEK   Current Outpatient Medications (Respiratory):  .  fluticasone (FLONASE) 50 MCG/ACT nasal spray, Place 1 spray into both nostrils daily as needed for allergies or rhinitis. Marland Kitchen  loratadine (CLARITIN) 10 MG tablet, Take 10 mg by mouth as needed.   Current Outpatient Medications (Analgesics):  .  aspirin EC 81 MG tablet, Take 81 mg by mouth at bedtime.  Marland Kitchen  aspirin-acetaminophen-caffeine (EXCEDRIN MIGRAINE) 250-250-65 MG tablet, Take 2 tablets by mouth daily as needed for headache or migraine. .  naproxen sodium (ALEVE) 220 MG tablet, Take 220 mg by mouth as needed.   Current Outpatient Medications (Other):  .  cholecalciferol (VITAMIN D) 1000 units tablet, Take 3,000 Units by mouth daily. .  cyclobenzaprine (FLEXERIL) 10 MG tablet, Take 0.5-1 tablets (5-10 mg total) by mouth at bedtime. .  DULoxetine (CYMBALTA) 60 MG capsule, Take 1 capsule (60 mg total) by mouth daily. .  NONFORMULARY OR COMPOUNDED ITEM, Estradiol cream 0.2% with testosterone 1% cream, Apply 1/2 ml topically every morning as directed.  #97ml  grams .  pantoprazole (PROTONIX) 40 MG tablet, Take 1 tablet (40 mg total) by mouth daily. Marland Kitchen  gabapentin (NEURONTIN) 100 MG capsule, Take 2 capsules (200 mg total) by mouth at bedtime.    Past medical history, social, surgical and family history all reviewed in electronic medical record.  No pertanent information unless stated regarding to the chief complaint.   Review of Systems:  No headache, visual changes, nausea, vomiting, diarrhea, constipation, dizziness, abdominal pain, skin rash, fevers, chills, night  sweats, weight loss, swollen lymph nodes, , chest pain, shortness of breath, mood changes.  Positive muscle aches, body aches  Objective  Blood pressure 100/62, pulse 89, height 5' (1.524 m), weight 168 lb (76.2 kg), last menstrual period 09/24/2011, SpO2 98 %.    General: No apparent distress alert and oriented x3 mood and affect normal, dressed appropriately.  HEENT: Pupils equal, extraocular movements intact  Respiratory: Patient's speak in full sentences and does not appear short of breath  Cardiovascular: No lower extremity edema, non tender, no erythema  Skin: Warm dry intact with no signs of infection or rash on extremities or on axial skeleton.  Abdomen: Soft nontender  Neuro: Cranial nerves II through XII are intact, neurovascularly intact in all extremities with 2+ DTRs and 2+ pulses.  Lymph: No lymphadenopathy of posterior or anterior cervical chain or axillae bilaterally.  Gait antalgic MSK:  tender with full range of motion and good stability and symmetric strength and tone of shoulders, elbows, wrist and ankles bilaterally.  Patient's right hip exam shows severe decrease in internal range of motion.  Mild tightness in the groin area with even external range of motion.  4-5 strength compared to contralateral side. Back exam does have loss of lordosis.  Patient does have some pain with extension.  Tightness noted with straight leg test right greater than left but no true  radicular symptoms but weakness of the right lower extremity is noted  Osteopathic findings  T9 extended rotated and side bent left L2 flexed rotated and side bent right Sacrum right on right     Impression and Recommendations:     This case required medical decision making of moderate complexity. The above documentation has been reviewed and is accurate and complete Lyndal Pulley, DO       Note: This dictation was prepared with Dragon dictation along with smaller phrase technology. Any transcriptional errors that result from this process are unintentional.

## 2019-06-13 ENCOUNTER — Encounter: Payer: Self-pay | Admitting: Family Medicine

## 2019-06-13 ENCOUNTER — Ambulatory Visit (INDEPENDENT_AMBULATORY_CARE_PROVIDER_SITE_OTHER): Payer: No Typology Code available for payment source | Admitting: Family Medicine

## 2019-06-13 ENCOUNTER — Ambulatory Visit (INDEPENDENT_AMBULATORY_CARE_PROVIDER_SITE_OTHER)
Admission: RE | Admit: 2019-06-13 | Discharge: 2019-06-13 | Disposition: A | Discharge status: Home / Self Care | Payer: No Typology Code available for payment source | Source: Ambulatory Visit | Attending: Family Medicine | Admitting: Family Medicine

## 2019-06-13 ENCOUNTER — Other Ambulatory Visit: Payer: Self-pay | Admitting: Family Medicine

## 2019-06-13 ENCOUNTER — Other Ambulatory Visit: Payer: Self-pay

## 2019-06-13 VITALS — BP 100/62 | HR 89 | Ht 60.0 in | Wt 168.0 lb

## 2019-06-13 DIAGNOSIS — G8929 Other chronic pain: Secondary | ICD-10-CM

## 2019-06-13 DIAGNOSIS — M999 Biomechanical lesion, unspecified: Secondary | ICD-10-CM | POA: Insufficient documentation

## 2019-06-13 DIAGNOSIS — M25562 Pain in left knee: Secondary | ICD-10-CM

## 2019-06-13 DIAGNOSIS — M545 Low back pain, unspecified: Secondary | ICD-10-CM

## 2019-06-13 DIAGNOSIS — M25561 Pain in right knee: Secondary | ICD-10-CM

## 2019-06-13 DIAGNOSIS — M25551 Pain in right hip: Secondary | ICD-10-CM | POA: Diagnosis not present

## 2019-06-13 MED ORDER — GABAPENTIN 100 MG PO CAPS: 200.0000 mg | ORAL_CAPSULE | Freq: Every day | ORAL | 3 refills | Status: DC

## 2019-06-13 MED FILL — GABAPENTIN 100 MG CAPSULE: 100 | 90 days supply | Qty: 180 | Fill #0

## 2019-06-13 NOTE — Patient Instructions (Addendum)
Good to see you.  Ice 20 minutes 2 times daily. Usually after activity and before bed. Exercises 3 times a week.  Xray downstairs  pennsaid pinkie amount topically 2 times daily as needed.  Gabapentin 200 mg at night  Turmeric 500mg  daily  Tart cherry extract 1200mg  at night Vitamin D 2000 IU daily  See me again in 5-6 weeks

## 2019-06-13 NOTE — Assessment & Plan Note (Signed)
Chronic low back pain seems to be secondary to L4-L5 facet arthropathy.  Patient known does also have unfortunately hip decreased range of motion concerning for osteoarthritic changes.  Patient could be a candidate for potential facet injections or a possible epidural.  We discussed with patient about icing regimen, topical anti-inflammatories.  Other medications that could be beneficial including the gabapentin and warned of potential side effects.  Attempted osteopathic manipulation today.  Follow-up again in 4 to 8 weeks

## 2019-06-13 NOTE — Assessment & Plan Note (Signed)
Decision today to treat with OMT was based on Physical Exam  After verbal consent patient was treated with HVLA, ME, FPR techniques in  thoracic, lumbar and sacral areas  Patient tolerated the procedure well with improvement in symptoms  Patient given exercises, stretches and lifestyle modifications  See medications in patient instructions if given  Patient will follow up in 4-8 weeks 

## 2019-06-13 NOTE — Assessment & Plan Note (Signed)
Concern for possible osteoarthritic changes, x-rays pending.  Could be severe and may need surgical intervention.  Could be what is contributing to most of patient's discomfort in the back as well.

## 2019-06-16 ENCOUNTER — Encounter: Payer: Self-pay | Admitting: Family Medicine

## 2019-06-17 ENCOUNTER — Other Ambulatory Visit: Payer: Self-pay

## 2019-06-17 ENCOUNTER — Encounter: Payer: Self-pay | Admitting: Internal Medicine

## 2019-06-17 DIAGNOSIS — M25551 Pain in right hip: Secondary | ICD-10-CM

## 2019-06-17 DIAGNOSIS — G8929 Other chronic pain: Secondary | ICD-10-CM

## 2019-06-23 ENCOUNTER — Encounter: Payer: Self-pay | Admitting: Orthopaedic Surgery

## 2019-06-23 ENCOUNTER — Other Ambulatory Visit: Payer: Self-pay

## 2019-06-23 ENCOUNTER — Ambulatory Visit (INDEPENDENT_AMBULATORY_CARE_PROVIDER_SITE_OTHER): Payer: No Typology Code available for payment source | Admitting: Orthopaedic Surgery

## 2019-06-23 DIAGNOSIS — M1611 Unilateral primary osteoarthritis, right hip: Secondary | ICD-10-CM | POA: Insufficient documentation

## 2019-06-23 DIAGNOSIS — M25551 Pain in right hip: Secondary | ICD-10-CM

## 2019-06-23 NOTE — Progress Notes (Signed)
Office Visit Note   Patient: Sarah Mullen           Date of Birth: [birth year]           MRN: [id number] Visit Date: 06/23/2019              Requested by: Binnie Rail, MD [place],  [place] [address] PCP: Binnie Rail, MD   Assessment & Plan: Visit Diagnoses:  1. Right hip pain   2. Unilateral primary osteoarthritis, right hip     Plan: We went over her x-rays in detail and her clinical exam findings.  I have recommended a total hip arthroplasty direct anterior approach.  I gave her a handout about this.  I showed her hip model and explained in detail what surgery involves including the risk and benefits of surgery.  We talked about her interoperative and postoperative course.  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  All question concerns were answered and addressed.  I gave her our surgery scheduler's card.  She will contact us when she would like to have this scheduled.  I do feel at this point did I would be the best option for her.  An intra-articular injection is not warranted at this point given the severe disease in her hip.  Follow-Up Instructions: Return if symptoms worsen or fail to improve.   Orders:  No orders of the defined types were placed in this encounter.  No orders of the defined types were placed in this encounter.     Procedures: No procedures performed   Clinical Data: No additional findings.   Subjective: Chief Complaint  Patient presents with  . Right Hip - Pain  [name] is a very pleasant 63 year old female who has significant right hip pain.  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  She does report groin pain.  It has been hurting off and on for 20 years now and has not always been a constant pain but it is getting worse in general.  She also reports a leg length discrepancy on that side.  She denies any left hip issues at  all.  She does walk with a limp.  She has x-rays on the canopy system that do report severe end-stage arthritis of the right hip.  At this point she is wanting to consider treatment options.  She is work on activity modification and weight loss.  She is a prediabetic.  She does take anti-inflammatories.  At this point her right hip pain is starting to detrimentally affect her mobility, her quality of life, and her actives of daily living.  HPI  Review of Systems She currently denies any headache, chest pain, shortness of breath, fever, chills, nausea, vomiting  Objective: Vital Signs: LMP 09/24/2011   Physical Exam She is alert and orient x3 and in no acute distress Ortho Exam Examination of her left hip is normal examination of her right hip shows significant pain with internal and external rotation.  There is also limitation of motion and stiffness. Specialty Comments:  No specialty comments available.  Imaging: No results found. X-rays independently reviewed on the canopy system of her right hip shows severe end-stage arthritis.  The hip is actually superior and laterally migrating showing almost some subluxation.  There are periarticular osteophytes and significant sclerotic changes.  This is  end-stage arthritis.  PMFS History: Patient Active Problem List   Diagnosis Date Noted  . Unilateral primary osteoarthritis, right hip 06/23/2019  . Nonallopathic lesion of sacral region 06/13/2019  . Nonallopathic lesion of lumbosacral region 06/13/2019  . Nonallopathic lesion of thoracic region 06/13/2019  . Right hip pain 06/13/2019  . Left knee pain 01/17/2019  . Spinal stenosis in cervical region 03/27/2018  . Testosterone deficiency 08/22/2017  . Hyperlipidemia 08/22/2017  . Chronic lower back pain 08/22/2017  . Cervical myelopathy ([place]) 05/15/2016  . Vitamin D deficiency 05/15/2016  . Prediabetes 05/14/2016  . Fatty liver 03/09/2016  . Positive TB test 05/28/2012  . GERD  (gastroesophageal reflux disease) 11/21/2010  . Dyshidrotic eczema 11/21/2010   Past Medical History:  Diagnosis Date  . Allergic rhinitis, cause unspecified   . Blood transfusion    iron transfusion- [place]-2009  . Chronic headaches   . Depression   . Diverticulitis   . GERD (gastroesophageal reflux disease)   . Heart murmur    diag with echo-physiologic murmur; trace MR, trivial, early systolic MR by 123456 echo ([facility])  . History of sinus tachycardia    07/04/07 Holter monitor: 69-154, average HR 95 bpm. Occ PACs, 4 runs probable atrial tachycardia  . Menometrorrhagia 11/21/2010  . Migraines   . Pre-diabetes     Family History  Problem Relation Age of Onset  . Arthritis Mother   . Hyperlipidemia Mother   . Heart disease Mother   . Arthritis Father   . Lung cancer Father        smoker  . Heart disease Father   . Hypertension Father   . Diabetes Father   . Colon cancer Paternal Aunt   . CAD Paternal Grandmother   . Kidney disease Maternal Grandfather        ESRD on HD  . Kidney disease Maternal Grandmother        ESRD on HD  . Diabetes Brother   . Metabolic syndrome Sister   . Stomach cancer Neg Hx   . Rectal cancer Neg Hx   . Esophageal cancer Neg Hx     Past Surgical History:  Procedure Laterality Date  . ABDOMINAL HYSTERECTOMY  09/2011  . ANTERIOR CERVICAL DECOMP/DISCECTOMY FUSION N/A 03/27/2018   Procedure: Anteroir Cervical Decrompression Fusion - Cervical Three - Cervical Four - Cervical Four - Cervical Five - Cervical Five - Cervical Six;  Surgeon: Kary Kos, MD;  Location: [facility];  Service: Neurosurgery;  Laterality: N/A;  Anteroir Cervical Decrompression Fusion - Cervical Three - Cervical Four - Cervical Four - Cervical Five - Cervical Five - Cervical Six  . BREAST BIOPSY     right breast/benign  . CERCLAGE REMOVAL  1986   WHEN pregnant  . [place]   1 time  . CYSTOSCOPY  10/17/2011   Procedure: CYSTOSCOPY;  Surgeon:  Megan Salon, MD;  Location: [facility];  Service: Gynecology;;  . DILATION AND CURETTAGE OF UTERUS    . TONSILLECTOMY     Social History   Occupational History  . Not on file  Tobacco Use  . Smoking status: Former Smoker    Types: Cigarettes    Quit date: 10/09/2001    Years since quitting: 17.7  . Smokeless tobacco: Never Used  Substance and Sexual Activity  . Alcohol use: No    Alcohol/week: 0.0 standard drinks  . Drug use: No  . Sexual activity: Yes    Partners: Male  Birth control/protection: Surgical    Comment: TLH/BSO

## 2019-06-30 ENCOUNTER — Encounter: Payer: Self-pay | Admitting: Orthopaedic Surgery

## 2019-06-30 ENCOUNTER — Encounter: Payer: Self-pay | Admitting: Internal Medicine

## 2019-07-10 MED FILL — DULoxetine HCL 60 MG CPEP: 60 | 90 days supply | Qty: 90 | Fill #1

## 2019-07-10 MED FILL — PANTOPRAZOLE SOD DR 40 MG T: 40 | 90 days supply | Qty: 90 | Fill #1

## 2019-07-16 ENCOUNTER — Ambulatory Visit: Payer: No Typology Code available for payment source | Admitting: Family Medicine

## 2019-07-16 NOTE — Progress Notes (Deleted)
[facility] [place], [place] [address] Phone: [phone] Subjective:    I'm seeing this patient by the request  of:    CC:   [id number]  Sarah Mullen is a 63 y.o. female coming in with complaint of ***  Onset-  Location Duration-  Character- Aggravating factors- Reliving factors-  Therapies tried-  Severity-     Past Medical History:  Diagnosis Date  . Allergic rhinitis, cause unspecified   . Blood transfusion    iron transfusion- [place]-2009  . Chronic headaches   . Depression   . Diverticulitis   . GERD (gastroesophageal reflux disease)   . Heart murmur    diag with echo-physiologic murmur; trace MR, trivial, early systolic MR by [id number] echo ([facility])  . History of sinus tachycardia    07/04/07 Holter monitor: 69-154, average HR 95 bpm. Occ PACs, 4 runs probable atrial tachycardia  . Menometrorrhagia 11/21/2010  . Migraines   . Pre-diabetes    Past Surgical History:  Procedure Laterality Date  . ABDOMINAL HYSTERECTOMY  09/2011  . ANTERIOR CERVICAL DECOMP/DISCECTOMY FUSION N/A 03/27/2018   Procedure: Anteroir Cervical Decrompression Fusion - Cervical Three - Cervical Four - Cervical Four - Cervical Five - Cervical Five - Cervical Six;  Surgeon: Kary Kos, MD;  Location: [facility];  Service: Neurosurgery;  Laterality: N/A;  Anteroir Cervical Decrompression Fusion - Cervical Three - Cervical Four - Cervical Four - Cervical Five - Cervical Five - Cervical Six  . BREAST BIOPSY     right breast/benign  . CERCLAGE REMOVAL  1986   WHEN pregnant  . [place]   1 time  . CYSTOSCOPY  10/17/2011   Procedure: CYSTOSCOPY;  Surgeon: Megan Salon, MD;  Location: [facility];  Service: Gynecology;;  . DILATION AND CURETTAGE OF UTERUS    . TONSILLECTOMY     Social History   Socioeconomic History  . Marital status: Single    Spouse name: Not on file  . Number of children: Not on file  . Years of  education: Not on file  . Highest education level: Not on file  Occupational History  . Not on file  Tobacco Use  . Smoking status: Former Smoker    Types: Cigarettes    Quit date: 10/09/2001    Years since quitting: 17.7  . Smokeless tobacco: Never Used  Substance and Sexual Activity  . Alcohol use: No    Alcohol/week: 0.0 standard drinks  . Drug use: No  . Sexual activity: Yes    Partners: Male    Birth control/protection: Surgical    Comment: TLH/BSO  Other Topics Concern  . Not on file  Social History Narrative  . Not on file   Social Determinants of Health   Financial Resource Strain:   . Difficulty of Paying Living Expenses: Not on file  Food Insecurity:   . Worried About Charity fundraiser in the Last Year: Not on file  . Ran Out of Food in the Last Year: Not on file  Transportation Needs:   . Lack of Transportation (Medical): Not on file  . Lack of Transportation (Non-Medical): Not on file  Physical Activity:   . Days of Exercise per Week: Not on file  . Minutes of Exercise per Session: Not on file  Stress:   . Feeling of Stress : Not on file  Social Connections:   . Frequency of Communication with Friends and Family:  Not on file  . Frequency of Social Gatherings with Friends and Family: Not on file  . Attends Religious Services: Not on file  . Active Member of Clubs or Organizations: Not on file  . Attends Archivist Meetings: Not on file  . Marital Status: Not on file   Allergies  Allergen Reactions  . Kiwi Extract     Throat itching   . Contrast Media [Iodinated Diagnostic Agents] Rash  . Keflex [Cephalexin] Rash   Family History  Problem Relation Age of Onset  . Arthritis Mother   . Hyperlipidemia Mother   . Heart disease Mother   . Arthritis Father   . Lung cancer Father        smoker  . Heart disease Father   . Hypertension Father   . Diabetes Father   . Colon cancer Paternal Aunt   . CAD Paternal Grandmother   . Kidney disease  Maternal Grandfather        ESRD on HD  . Kidney disease Maternal Grandmother        ESRD on HD  . Diabetes Brother   . Metabolic syndrome Sister   . Stomach cancer Neg Hx   . Rectal cancer Neg Hx   . Esophageal cancer Neg Hx     Current Outpatient Medications (Endocrine & Metabolic):  .  estradiol (VIVELLE-DOT) 0.1 MG/24HR patch, APPLY 1 PATCH TOPICALLY TWICE A WEEK   Current Outpatient Medications (Respiratory):  .  fluticasone (FLONASE) 50 MCG/ACT nasal spray, Place 1 spray into both nostrils daily as needed for allergies or rhinitis. Marland Kitchen  loratadine (CLARITIN) 10 MG tablet, Take 10 mg by mouth as needed.   Current Outpatient Medications (Analgesics):  .  aspirin EC 81 MG tablet, Take 81 mg by mouth at bedtime.  Marland Kitchen  aspirin-acetaminophen-caffeine (EXCEDRIN MIGRAINE) 250-250-65 MG tablet, Take 2 tablets by mouth daily as needed for headache or migraine. .  naproxen sodium (ALEVE) 220 MG tablet, Take 220 mg by mouth as needed.   Current Outpatient Medications (Other):  .  cholecalciferol (VITAMIN D) 1000 units tablet, Take 3,000 Units by mouth daily. .  cyclobenzaprine (FLEXERIL) 10 MG tablet, Take 0.5-1 tablets (5-10 mg total) by mouth at bedtime. .  DULoxetine (CYMBALTA) 60 MG capsule, Take 1 capsule (60 mg total) by mouth daily. Marland Kitchen  gabapentin (NEURONTIN) 100 MG capsule, Take 2 capsules (200 mg total) by mouth at bedtime. .  NONFORMULARY OR COMPOUNDED ITEM, Estradiol cream 0.2% with testosterone 1% cream, Apply 1/2 ml topically every morning as directed.  #4ml grams .  pantoprazole (PROTONIX) 40 MG tablet, Take 1 tablet (40 mg total) by mouth daily.    Past medical history, social, surgical and family history all reviewed in electronic medical record.  No pertanent information unless stated regarding to the chief complaint.   Review of Systems:  No headache, visual changes, nausea, vomiting, diarrhea, constipation, dizziness, abdominal pain, skin rash, fevers, chills, night  sweats, weight loss, swollen lymph nodes, body aches, joint swelling, muscle aches, chest pain, shortness of breath, mood changes.   Objective  Last menstrual period 09/24/2011. Systems examined below as of    General: No apparent distress alert and oriented x3 mood and affect normal, dressed appropriately.  HEENT: Pupils equal, extraocular movements intact  Respiratory: Patient's speak in full sentences and does not appear short of breath  Cardiovascular: No lower extremity edema, non tender, no erythema  Skin: Warm dry intact with no signs of infection or rash on extremities or  on axial skeleton.  Abdomen: Soft nontender  Neuro: Cranial nerves II through XII are intact, neurovascularly intact in all extremities with 2+ DTRs and 2+ pulses.  Lymph: No lymphadenopathy of posterior or anterior cervical chain or axillae bilaterally.  Gait normal with good balance and coordination.  MSK:  Non tender with full range of motion and good stability and symmetric strength and tone of shoulders, elbows, wrist, hip, knee and ankles bilaterally.     Impression and Recommendations:     This case required medical decision making of moderate complexity. The above documentation has been reviewed and is accurate and complete Lyndal Pulley, DO       Note: This dictation was prepared with Dragon dictation along with smaller phrase technology. Any transcriptional errors that result from this process are unintentional.

## 2019-07-20 ENCOUNTER — Encounter: Payer: Self-pay | Admitting: Orthopaedic Surgery

## 2019-07-24 ENCOUNTER — Other Ambulatory Visit: Payer: Self-pay | Admitting: Physician Assistant

## 2019-08-01 ENCOUNTER — Other Ambulatory Visit: Payer: Self-pay

## 2019-08-01 ENCOUNTER — Other Ambulatory Visit (HOSPITAL_COMMUNITY)
Admission: RE | Admit: 2019-08-01 | Discharge: 2019-08-01 | Disposition: A | Discharge status: Home / Self Care | Payer: No Typology Code available for payment source | Source: Ambulatory Visit | Attending: Orthopaedic Surgery | Admitting: Orthopaedic Surgery

## 2019-08-01 ENCOUNTER — Encounter (HOSPITAL_COMMUNITY)
Admission: RE | Admit: 2019-08-01 | Discharge: 2019-08-01 | Disposition: A | Discharge status: Home / Self Care | Payer: No Typology Code available for payment source | Source: Ambulatory Visit | Attending: Orthopaedic Surgery | Admitting: Orthopaedic Surgery

## 2019-08-01 ENCOUNTER — Encounter (HOSPITAL_COMMUNITY): Payer: Self-pay

## 2019-08-01 DIAGNOSIS — Z20822 Contact with and (suspected) exposure to covid-19: Secondary | ICD-10-CM | POA: Diagnosis not present

## 2019-08-01 DIAGNOSIS — Z01811 Encounter for preprocedural respiratory examination: Secondary | ICD-10-CM | POA: Insufficient documentation

## 2019-08-01 LAB — CBC
HCT: 41.7 % (ref 36.0–46.0)
Hemoglobin: 13.1 g/dL (ref 12.0–15.0)
MCH: 28.4 pg (ref 26.0–34.0)
MCHC: 31.4 g/dL (ref 30.0–36.0)
MCV: 90.5 fL (ref 80.0–100.0)
Platelets: 304 10*3/uL (ref 150–400)
RBC: 4.61 MIL/uL (ref 3.87–5.11)
RDW: 13.6 % (ref 11.5–15.5)
WBC: 7.6 10*3/uL (ref 4.0–10.5)
nRBC: 0 % (ref 0.0–0.2)

## 2019-08-01 LAB — BASIC METABOLIC PANEL
Anion gap: 9 (ref 5–15)
BUN: 22 mg/dL (ref 8–23)
CO2: 22 mmol/L (ref 22–32)
Calcium: 9.8 mg/dL (ref 8.9–10.3)
Chloride: 110 mmol/L (ref 98–111)
Creatinine, Ser: 0.69 mg/dL (ref 0.44–1.00)
GFR calc Af Amer: >60 mL/min (ref >60)
GFR calc non Af Amer: >60 mL/min (ref >60)
Glucose, Bld: 119 mg/dL — ABNORMAL HIGH (ref 70–99)
Potassium: 4.9 mmol/L (ref 3.5–5.1)
Sodium: 141 mmol/L (ref 135–145)

## 2019-08-01 LAB — SURGICAL PCR SCREEN
MRSA, PCR: NEGATIVE
Staphylococcus aureus: NEGATIVE

## 2019-08-01 NOTE — Progress Notes (Signed)
PCP - Billey Gosling Cardiologist - patient denies  PPM/ICD - n/a Device Orders -  Rep Notified -   Chest x-ray - n/a EKG - 08/01/2019 Stress Test - patient denies ECHO - per records, 07/04/2007? Patient states 20+ years ago but is not sure.  [place] Cardiac Cath - patient denies  Sleep Study - patient denies CPAP -   Patient has history of pre-diabetes but doesn't check CBG.  Last A1C was 6.1 on 05/23/2019   Blood Thinner Instructions: Aspirin Instructions: patient stopped ASA in early december  ERAS Protcol - clears until 0930 PRE-SURGERY Ensure or G2- patient given bottle of water with history of pre-diabetes  COVID TEST- 08/01/2019   Anesthesia review: yes, history of heart murmur  Patient denies shortness of breath, fever, cough and chest pain at PAT appointment   All instructions explained to the patient, with a verbal understanding of the material. Patient agrees to go over the instructions while at home for a better understanding. Patient also instructed to self quarantine after being tested for COVID-19. The opportunity to ask questions was provided.

## 2019-08-01 NOTE — Progress Notes (Addendum)
[place], [place] - [facility] [facility] [address] [place] [address] Phone: [phone] Fax: [place], [place] [address] [place] [address] Phone: [phone] Fax: [phone]      Your procedure is scheduled on Tuesday 08/05/2019.  Report to [facility] Main Entrance "A" at 10:30 A.M., and check in at the Admitting office.  Call this number if you have problems the morning of surgery:  [phone]   Call [phone] if you have any questions prior to your surgery date Monday-Friday 8am-4pm    Remember:  Do not eat or drink after midnight the night before your surgery  You may drink clear liquids until 09:30am the morning of your surgery.   Clear liquids allowed are: Water, Non-Citrus Juices (without pulp), Carbonated Beverages, Clear Tea, Black Coffee Only, and Gatorade  Please complete your bottled water that was provided to you by 0930 the morning of surgery.  Please, if able, drink it in one sitting. DO NOT SIP.     Take these medicines the morning of surgery with A SIP OF WATER: Cetirizine (Zyrtec) Duloxetine (Cymbalta) Pantoprazole (Protonix)  7 days prior to surgery STOP taking any Aspirin (unless otherwise instructed by your surgeon), Aleve, Naproxen, Ibuprofen, Motrin, Advil, Goody's, BC's, all herbal medications, fish oil, and all vitamins.    The Morning of Surgery  Do not wear jewelry, make-up or nail polish.  Do not wear lotions, powders, perfumes, or deodorant  Do not shave 48 hours prior to surgery.   Do not bring valuables to the hospital.  [facility] is not responsible for any belongings or valuables.  If you are a smoker, DO NOT Smoke 24 hours prior to surgery  If you wear a CPAP at night please bring your mask, tubing, and machine the morning of surgery   Remember that you must have someone to transport you home after  your surgery, and remain with you for 24 hours if you are discharged the same day.   Please bring cases for contacts, glasses, hearing aids, dentures or bridgework because it cannot be worn into surgery.    Leave your suitcase in the car.  After surgery it may be brought to your room.  For patients admitted to the hospital, discharge time will be determined by your treatment team.  Patients discharged the day of surgery will not be allowed to drive home.    Special instructions:   [facility]- Preparing For Surgery  Before surgery, you can play an important role. Because skin is not sterile, your skin needs to be as free of germs as possible. You can reduce the number of germs on your skin by washing with CHG (chlorahexidine gluconate) Soap before surgery.  CHG is an antiseptic cleaner which kills germs and bonds with the skin to continue killing germs even after washing.    Oral Hygiene is also important to reduce your risk of infection.  Remember - BRUSH YOUR TEETH THE MORNING OF SURGERY WITH YOUR REGULAR TOOTHPASTE  Please do not use if you have an allergy to CHG or antibacterial soaps. If your skin becomes reddened/irritated stop using the CHG.  Do not shave (including legs and underarms) for at least 48 hours prior to first CHG shower. It is OK to shave your face.  Please follow these instructions carefully.   1. Shower the NIGHT BEFORE SURGERY and the MORNING OF SURGERY with CHG Soap.  2. If you chose to wash your hair, wash your hair first as usual with your normal shampoo.  3. After you shampoo, rinse your hair and body thoroughly to remove the shampoo.  4. Use CHG as you would any other liquid soap. You can apply CHG directly to the skin and wash gently with a scrungie or a clean washcloth.   5. Apply the CHG Soap to your body ONLY FROM THE NECK DOWN.  Do not use on open wounds or open sores. Avoid contact with your eyes, ears, mouth and genitals (private parts). Wash Face  and genitals (private parts)  with your normal soap.   6. Wash thoroughly, paying special attention to the area where your surgery will be performed.  7. Thoroughly rinse your body with warm water from the neck down.  8. DO NOT shower/wash with your normal soap after using and rinsing off the CHG Soap.  9. Pat yourself dry with a CLEAN TOWEL.  10. Wear CLEAN PAJAMAS to bed the night before surgery, wear comfortable clothes the morning of surgery  11. Place CLEAN SHEETS on your bed the night of your first shower and DO NOT SLEEP WITH PETS.    Day of Surgery:  Please shower the morning of surgery with the CHG soap Do not apply any deodorants/lotions. Please wear clean clothes to the hospital/surgery center.   Remember to brush your teeth WITH YOUR REGULAR TOOTHPASTE.   Please read over the following fact sheets that you were given.

## 2019-08-02 LAB — NOVEL CORONAVIRUS, NAA (HOSP ORDER, SEND-OUT TO REF LAB; TAT 18-24 HRS): SARS-CoV-2, NAA: NOT DETECTED

## 2019-08-04 ENCOUNTER — Other Ambulatory Visit: Payer: Self-pay | Admitting: *Deleted

## 2019-08-04 NOTE — Patient Outreach (Signed)
[facility]) Care Management  08/04/2019  Sarah Mullen 10/27/1955 [id number]  Preoperative Screening Call Referral received: 08/04/19 Surgery/procedure date: 08/05/19 Insurance: [place]   Subjective:  Initial successful telephone call to patient's preferred number in order to complete preoperative screening. 2 HIPAA identifiers verified. Discussed purpose of preoperative call. Patient voices understanding and agrees to call.  She states that she understands the reason for the surgery Right total hip arthroplasty and looking forward to having it done to decrease pain and be able to participate in activities she hasn't been able to do in a while, she discussed her positive attitude . 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  She understands  the expected time of arrival. She has completed her  preoperative testing on 08/01/19 including covid 19  and has no additional questions, she continues on quarantine.  She  expects to be in the hospital 1 to 2  days. She has made contact with [employer] regarding family medical leave and will have her partner turn in completed paper work, she has spoken with [employer] regarding short term disability and she  does not have  the hospital indemnity benefit . She  says she  will have 24/7 care at home provided by her partner, her mother and daughter will be available.   to assist in her  recovery. She does not have and is not interested in completing advanced directives at this time.  Objective:  Sarah Mullen is  scheduled for Total right hip arthroplasty on 06/04/20 at [facility] . She  completed pre-op testing on 08/01/19  Assessment: Preoperative call completed, no preoperative needs identified.   Plan: Discussed with patient that she will be receiving automated interactive voice response calls after discharge to assess for discharge needs.   Joylene Draft, RN, Economy Management Coordinator  [phone]- Mobile [phone]- Toll Free Main Office

## 2019-08-04 NOTE — Anesthesia Preprocedure Evaluation (Addendum)
Anesthesia Evaluation  Patient identified by MRN, date of birth, ID band Patient awake    Reviewed: Allergy & Precautions, NPO status , Patient's Chart, lab work & pertinent test results  Airway Mallampati: III  TM Distance: <3 FB Neck ROM: Limited    Dental no notable dental hx.    Pulmonary neg pulmonary ROS, former smoker,    Pulmonary exam normal breath sounds clear to auscultation       Cardiovascular negative cardio ROS   Rhythm:Regular Rate:Normal + Systolic murmurs    Neuro/Psych negative neurological ROS  negative psych ROS   GI/Hepatic Neg liver ROS, GERD  ,  Endo/Other  negative endocrine ROS  Renal/GU negative Renal ROS  negative genitourinary   Musculoskeletal negative musculoskeletal ROS (+)   Abdominal   Peds negative pediatric ROS (+)  Hematology negative hematology ROS (+)   Anesthesia Other Findings   Reproductive/Obstetrics negative OB ROS                            Anesthesia Physical Anesthesia Plan  ASA: II  Anesthesia Plan: Spinal   Post-op Pain Management:    Induction: Intravenous  PONV Risk Score and Plan: 2 and Ondansetron, Dexamethasone and Treatment may vary due to age or medical condition  Airway Management Planned: Simple Face Mask  Additional Equipment:   Intra-op Plan:   Post-operative Plan:   Informed Consent: I have reviewed the patients History and Physical, chart, labs and discussed the procedure including the risks, benefits and alternatives for the proposed anesthesia with the patient or authorized representative who has indicated his/her understanding and acceptance.     Dental advisory given  Plan Discussed with: CRNA and Surgeon  Anesthesia Plan Comments: (History of murmur (trace MR/TR by echo '08), sinus tachycardia with '08 Holter monitor showing occasional atrial arrhythmia with benign features.   S/p C3-6 ACDF 2019.    Preop labs reviewed, WNL.  EKG 08/01/19: NSR. Rate 83.  Cardiology records from [facility] include (scanned under Media tab; Correspondence, 10/17/11): - Echo 07/04/07: Normal left ventricular size with well-preserved systolic function.  LVEF in the range of 70%.  Trace mitral regurgitation.  Trivial, early systolic mitral regurgitation.  Trace tricuspid regurgitation without evidence of pulmonary hypertension.  - 24 hour Holter monitor 07/04/07: Conclusions: Baseline sinus rhythm at an average rate of 95 bpm with rates ranging from 69 bpm during sleep to 154 bpm during stair climbing activity.  No prolonged pauses.  No ventricular ectopy.  Occasional supraventricular ectopy consisting primarily of single PACs but with 4 runs of probable ectopic atrial tachycardia lasting up to 20 seconds at a heart rate approximately 125 bpm.  Symptoms of palpitations, chest pressure or shortness of breath primarily correlated with sinus tachycardia.  Interestingly the brief atrial arrhythmia was not apparently associated with symptoms.  Occasional atrial arrhythmia with benign features. )       Anesthesia Quick Evaluation

## 2019-08-04 NOTE — Progress Notes (Signed)
Anesthesia Chart Review:  History of murmur (trace MR/TR by echo '08), sinus tachycardia with '08 Holter monitor showing occasional atrial arrhythmia with benign features.   S/p C3-6 ACDF 2019.   Preop labs reviewed, WNL.  EKG 08/01/19: NSR. Rate 83.  Cardiology records from [facility] include (scanned under Media tab; Correspondence, 10/17/11): - Echo 07/04/07: Normal left ventricular size with well-preserved systolic function.  LVEF in the range of 70%.  Trace mitral regurgitation.  Trivial, early systolic mitral regurgitation.  Trace tricuspid regurgitation without evidence of pulmonary hypertension.  - 24 hour Holter monitor 07/04/07: Conclusions: Baseline sinus rhythm at an average rate of 95 bpm with rates ranging from 69 bpm during sleep to 154 bpm during stair climbing activity.  No prolonged pauses.  No ventricular ectopy.  Occasional supraventricular ectopy consisting primarily of single PACs but with 4 runs of probable ectopic atrial tachycardia lasting up to 20 seconds at a heart rate approximately 125 bpm.  Symptoms of palpitations, chest pressure or shortness of breath primarily correlated with sinus tachycardia.  Interestingly the brief atrial arrhythmia was not apparently associated with symptoms.  Occasional atrial arrhythmia with benign features.  Wynonia Musty [facility] Short Stay Center/Anesthesiology Phone [phone] 08/04/2019 10:45 AM

## 2019-08-05 ENCOUNTER — Encounter (HOSPITAL_COMMUNITY)
Admission: RE | Disposition: A | Discharge status: Home Health | Payer: Self-pay | Source: Home / Self Care | Attending: Orthopaedic Surgery

## 2019-08-05 ENCOUNTER — Encounter (HOSPITAL_COMMUNITY): Payer: Self-pay | Admitting: Orthopaedic Surgery

## 2019-08-05 ENCOUNTER — Inpatient Hospital Stay (HOSPITAL_COMMUNITY): Payer: No Typology Code available for payment source | Admitting: Anesthesiology

## 2019-08-05 ENCOUNTER — Inpatient Hospital Stay (HOSPITAL_COMMUNITY)
Admission: RE | Admit: 2019-08-05 | Discharge: 2019-08-06 | DRG: 470 | Disposition: A | Discharge status: Home Health | Payer: No Typology Code available for payment source | Attending: Orthopaedic Surgery | Admitting: Orthopaedic Surgery

## 2019-08-05 ENCOUNTER — Other Ambulatory Visit: Payer: Self-pay

## 2019-08-05 ENCOUNTER — Inpatient Hospital Stay (HOSPITAL_COMMUNITY): Payer: No Typology Code available for payment source

## 2019-08-05 ENCOUNTER — Inpatient Hospital Stay (HOSPITAL_COMMUNITY): Payer: No Typology Code available for payment source | Admitting: Physician Assistant

## 2019-08-05 DIAGNOSIS — Z8249 Family history of ischemic heart disease and other diseases of the circulatory system: Secondary | ICD-10-CM

## 2019-08-05 DIAGNOSIS — Z91018 Allergy to other foods: Secondary | ICD-10-CM

## 2019-08-05 DIAGNOSIS — G43909 Migraine, unspecified, not intractable, without status migrainosus: Secondary | ICD-10-CM | POA: Diagnosis present

## 2019-08-05 DIAGNOSIS — Z419 Encounter for procedure for purposes other than remedying health state, unspecified: Secondary | ICD-10-CM

## 2019-08-05 DIAGNOSIS — E785 Hyperlipidemia, unspecified: Secondary | ICD-10-CM | POA: Diagnosis present

## 2019-08-05 DIAGNOSIS — Z91041 Radiographic dye allergy status: Secondary | ICD-10-CM | POA: Diagnosis not present

## 2019-08-05 DIAGNOSIS — Z96641 Presence of right artificial hip joint: Secondary | ICD-10-CM

## 2019-08-05 DIAGNOSIS — M1611 Unilateral primary osteoarthritis, right hip: Secondary | ICD-10-CM | POA: Diagnosis present

## 2019-08-05 DIAGNOSIS — K219 Gastro-esophageal reflux disease without esophagitis: Secondary | ICD-10-CM | POA: Diagnosis present

## 2019-08-05 DIAGNOSIS — G8929 Other chronic pain: Secondary | ICD-10-CM | POA: Diagnosis present

## 2019-08-05 DIAGNOSIS — Z981 Arthrodesis status: Secondary | ICD-10-CM | POA: Diagnosis not present

## 2019-08-05 DIAGNOSIS — M4802 Spinal stenosis, cervical region: Secondary | ICD-10-CM | POA: Diagnosis present

## 2019-08-05 DIAGNOSIS — Z833 Family history of diabetes mellitus: Secondary | ICD-10-CM

## 2019-08-05 DIAGNOSIS — Z801 Family history of malignant neoplasm of trachea, bronchus and lung: Secondary | ICD-10-CM | POA: Diagnosis not present

## 2019-08-05 DIAGNOSIS — Z87891 Personal history of nicotine dependence: Secondary | ICD-10-CM

## 2019-08-05 DIAGNOSIS — Z7982 Long term (current) use of aspirin: Secondary | ICD-10-CM | POA: Diagnosis not present

## 2019-08-05 DIAGNOSIS — Z8349 Family history of other endocrine, nutritional and metabolic diseases: Secondary | ICD-10-CM | POA: Diagnosis not present

## 2019-08-05 DIAGNOSIS — Z8261 Family history of arthritis: Secondary | ICD-10-CM

## 2019-08-05 DIAGNOSIS — M545 Low back pain: Secondary | ICD-10-CM | POA: Diagnosis present

## 2019-08-05 DIAGNOSIS — K760 Fatty (change of) liver, not elsewhere classified: Secondary | ICD-10-CM | POA: Diagnosis present

## 2019-08-05 DIAGNOSIS — Z800 Family history of malignant neoplasm of digestive organs: Secondary | ICD-10-CM | POA: Diagnosis not present

## 2019-08-05 DIAGNOSIS — Z881 Allergy status to other antibiotic agents status: Secondary | ICD-10-CM | POA: Diagnosis not present

## 2019-08-05 HISTORY — PX: TOTAL HIP ARTHROPLASTY: SHX124

## 2019-08-05 LAB — GLUCOSE, CAPILLARY: Glucose-Capillary: 122 mg/dL — ABNORMAL HIGH (ref 70–99)

## 2019-08-05 SURGERY — ARTHROPLASTY, HIP, TOTAL, ANTERIOR APPROACH
Anesthesia: Spinal | Site: Hip | Laterality: Right

## 2019-08-05 MED ORDER — PROMETHAZINE HCL 25 MG/ML IJ SOLN: 6.2500 mg | INTRAMUSCULAR | Status: DC | PRN

## 2019-08-05 MED ORDER — ONDANSETRON HCL 4 MG PO TABS: 4.0000 mg | ORAL_TABLET | Freq: Four times a day (QID) | ORAL | Status: DC | PRN

## 2019-08-05 MED ORDER — CLINDAMYCIN PHOSPHATE 900 MG/50ML IV SOLN
INTRAVENOUS | Status: AC
  Filled 2019-08-05: qty 50

## 2019-08-05 MED ORDER — POVIDONE-IODINE 10 % EX SWAB: 2.0000 "application " | Freq: Once | CUTANEOUS | Status: DC

## 2019-08-05 MED ORDER — PHENYLEPHRINE 40 MCG/ML (10ML) SYRINGE FOR IV PUSH (FOR BLOOD PRESSURE SUPPORT)
PREFILLED_SYRINGE | INTRAVENOUS | Status: AC
  Filled 2019-08-05: qty 20

## 2019-08-05 MED ORDER — METOCLOPRAMIDE HCL 5 MG/ML IJ SOLN: 5.0000 mg | Freq: Three times a day (TID) | INTRAMUSCULAR | Status: DC | PRN

## 2019-08-05 MED ORDER — BUPIVACAINE IN DEXTROSE 0.75-8.25 % IT SOLN
INTRATHECAL | Status: DC | PRN
  Administered 2019-08-05: 1.8 mL via INTRATHECAL

## 2019-08-05 MED ORDER — GABAPENTIN 100 MG PO CAPS
100.0000 mg | ORAL_CAPSULE | Freq: Three times a day (TID) | ORAL | Status: DC
  Administered 2019-08-05 – 2019-08-06 (×3): 100 mg via ORAL
  Filled 2019-08-05 (×4): qty 1

## 2019-08-05 MED ORDER — ONDANSETRON HCL 4 MG/2ML IJ SOLN
INTRAMUSCULAR | Status: DC | PRN
  Administered 2019-08-05: 4 mg via INTRAVENOUS

## 2019-08-05 MED ORDER — ALUM & MAG HYDROXIDE-SIMETH 200-200-20 MG/5ML PO SUSP: 30.0000 mL | ORAL | Status: DC | PRN

## 2019-08-05 MED ORDER — ASPIRIN 81 MG PO CHEW
81.0000 mg | CHEWABLE_TABLET | Freq: Two times a day (BID) | ORAL | Status: DC
  Administered 2019-08-05 – 2019-08-06 (×2): 81 mg via ORAL
  Filled 2019-08-05 (×2): qty 1

## 2019-08-05 MED ORDER — LIDOCAINE 2% (20 MG/ML) 5 ML SYRINGE
INTRAMUSCULAR | Status: AC
  Filled 2019-08-05: qty 15

## 2019-08-05 MED ORDER — PROPOFOL 10 MG/ML IV BOLUS
INTRAVENOUS | Status: DC | PRN
  Administered 2019-08-05: 10 mg via INTRAVENOUS

## 2019-08-05 MED ORDER — EPHEDRINE 5 MG/ML INJ
INTRAVENOUS | Status: AC
  Filled 2019-08-05: qty 10

## 2019-08-05 MED ORDER — METHOCARBAMOL 500 MG PO TABS
ORAL_TABLET | ORAL | Status: AC
  Filled 2019-08-05: qty 1

## 2019-08-05 MED ORDER — EPHEDRINE SULFATE-NACL 50-0.9 MG/10ML-% IV SOSY
PREFILLED_SYRINGE | INTRAVENOUS | Status: DC | PRN
  Administered 2019-08-05: 10 mg via INTRAVENOUS

## 2019-08-05 MED ORDER — METHOCARBAMOL 500 MG PO TABS
500.0000 mg | ORAL_TABLET | Freq: Four times a day (QID) | ORAL | Status: DC | PRN
  Administered 2019-08-05: 16:00:00 500 mg via ORAL

## 2019-08-05 MED ORDER — STERILE WATER FOR IRRIGATION IR SOLN
Status: DC | PRN
  Administered 2019-08-05: 1000 mL

## 2019-08-05 MED ORDER — OXYCODONE HCL 5 MG PO TABS
ORAL_TABLET | ORAL | Status: AC
  Filled 2019-08-05: qty 2

## 2019-08-05 MED ORDER — METOCLOPRAMIDE HCL 5 MG PO TABS: 5.0000 mg | ORAL_TABLET | Freq: Three times a day (TID) | ORAL | Status: DC | PRN

## 2019-08-05 MED ORDER — HYDROMORPHONE HCL 1 MG/ML IJ SOLN: 0.2500 mg | INTRAMUSCULAR | Status: DC | PRN

## 2019-08-05 MED ORDER — CLINDAMYCIN PHOSPHATE 900 MG/50ML IV SOLN
900.0000 mg | INTRAVENOUS | Status: AC
  Administered 2019-08-05: 900 mg via INTRAVENOUS

## 2019-08-05 MED ORDER — CLINDAMYCIN PHOSPHATE 600 MG/50ML IV SOLN
600.0000 mg | Freq: Four times a day (QID) | INTRAVENOUS | Status: AC
  Administered 2019-08-05 (×2): 600 mg via INTRAVENOUS
  Filled 2019-08-05 (×2): qty 50

## 2019-08-05 MED ORDER — ONDANSETRON HCL 4 MG/2ML IJ SOLN
INTRAMUSCULAR | Status: AC
  Filled 2019-08-05: qty 2

## 2019-08-05 MED ORDER — ONDANSETRON HCL 4 MG/2ML IJ SOLN
4.0000 mg | Freq: Four times a day (QID) | INTRAMUSCULAR | Status: DC | PRN
  Administered 2019-08-05: 18:00:00 4 mg via INTRAVENOUS
  Filled 2019-08-05: qty 2

## 2019-08-05 MED ORDER — HYDROMORPHONE HCL 1 MG/ML IJ SOLN
0.5000 mg | INTRAMUSCULAR | Status: DC | PRN
  Administered 2019-08-05 – 2019-08-06 (×3): 1 mg via INTRAVENOUS
  Filled 2019-08-05 (×3): qty 1

## 2019-08-05 MED ORDER — CHLORHEXIDINE GLUCONATE 4 % EX LIQD: 60.0000 mL | Freq: Once | CUTANEOUS | Status: DC

## 2019-08-05 MED ORDER — ACETAMINOPHEN 325 MG PO TABS: 325.0000 mg | ORAL_TABLET | Freq: Four times a day (QID) | ORAL | Status: DC | PRN

## 2019-08-05 MED ORDER — SODIUM CHLORIDE 0.9 % IR SOLN
Status: DC | PRN
  Administered 2019-08-05: 3000 mL

## 2019-08-05 MED ORDER — POLYETHYLENE GLYCOL 3350 17 G PO PACK: 17.0000 g | PACK | Freq: Every day | ORAL | Status: DC | PRN

## 2019-08-05 MED ORDER — OXYCODONE HCL 5 MG PO TABS
10.0000 mg | ORAL_TABLET | ORAL | Status: DC | PRN
  Administered 2019-08-05 – 2019-08-06 (×5): 15 mg via ORAL
  Filled 2019-08-05 (×5): qty 3

## 2019-08-05 MED ORDER — PROPOFOL 500 MG/50ML IV EMUL
INTRAVENOUS | Status: DC | PRN
  Administered 2019-08-05: 100 ug/kg/min via INTRAVENOUS

## 2019-08-05 MED ORDER — PHENYLEPHRINE 40 MCG/ML (10ML) SYRINGE FOR IV PUSH (FOR BLOOD PRESSURE SUPPORT)
PREFILLED_SYRINGE | INTRAVENOUS | Status: DC | PRN
  Administered 2019-08-05 (×2): 120 ug via INTRAVENOUS
  Administered 2019-08-05 (×2): 80 ug via INTRAVENOUS

## 2019-08-05 MED ORDER — MIDAZOLAM HCL 5 MG/5ML IJ SOLN
INTRAMUSCULAR | Status: DC | PRN
  Administered 2019-08-05: 2 mg via INTRAVENOUS

## 2019-08-05 MED ORDER — PANTOPRAZOLE SODIUM 40 MG PO TBEC
40.0000 mg | DELAYED_RELEASE_TABLET | Freq: Every day | ORAL | Status: DC
  Administered 2019-08-05: 21:00:00 40 mg via ORAL
  Filled 2019-08-05: qty 1

## 2019-08-05 MED ORDER — TRANEXAMIC ACID-NACL 1000-0.7 MG/100ML-% IV SOLN
1000.0000 mg | INTRAVENOUS | Status: AC
  Administered 2019-08-05: 1000 mg via INTRAVENOUS

## 2019-08-05 MED ORDER — DULOXETINE HCL 30 MG PO CPEP
60.0000 mg | ORAL_CAPSULE | Freq: Every day | ORAL | Status: DC
  Administered 2019-08-05: 21:00:00 60 mg via ORAL
  Filled 2019-08-05 (×2): qty 2

## 2019-08-05 MED ORDER — PHENYLEPHRINE HCL-NACL 10-0.9 MG/250ML-% IV SOLN
INTRAVENOUS | Status: DC | PRN
  Administered 2019-08-05: 25 ug/min via INTRAVENOUS

## 2019-08-05 MED ORDER — OXYCODONE HCL 5 MG PO TABS
5.0000 mg | ORAL_TABLET | ORAL | Status: DC | PRN
  Administered 2019-08-05: 16:00:00 10 mg via ORAL
  Filled 2019-08-05: qty 2

## 2019-08-05 MED ORDER — LACTATED RINGERS IV SOLN: INTRAVENOUS | Status: DC

## 2019-08-05 MED ORDER — FENTANYL CITRATE (PF) 250 MCG/5ML IJ SOLN
INTRAMUSCULAR | Status: AC
  Filled 2019-08-05: qty 5

## 2019-08-05 MED ORDER — 0.9 % SODIUM CHLORIDE (POUR BTL) OPTIME
TOPICAL | Status: DC | PRN
  Administered 2019-08-05: 1000 mL

## 2019-08-05 MED ORDER — MENTHOL 3 MG MT LOZG: 1.0000 | LOZENGE | OROMUCOSAL | Status: DC | PRN

## 2019-08-05 MED ORDER — SODIUM CHLORIDE 0.9 % IV SOLN: INTRAVENOUS | Status: DC

## 2019-08-05 MED ORDER — FENTANYL CITRATE (PF) 100 MCG/2ML IJ SOLN
INTRAMUSCULAR | Status: DC | PRN
  Administered 2019-08-05: 50 ug via INTRAVENOUS

## 2019-08-05 MED ORDER — PHENOL 1.4 % MT LIQD: 1.0000 | OROMUCOSAL | Status: DC | PRN

## 2019-08-05 MED ORDER — DOCUSATE SODIUM 100 MG PO CAPS
100.0000 mg | ORAL_CAPSULE | Freq: Two times a day (BID) | ORAL | Status: DC
  Administered 2019-08-05 – 2019-08-06 (×2): 100 mg via ORAL
  Filled 2019-08-05 (×2): qty 1

## 2019-08-05 MED ORDER — MIDAZOLAM HCL 2 MG/2ML IJ SOLN
INTRAMUSCULAR | Status: AC
  Filled 2019-08-05: qty 2

## 2019-08-05 MED ORDER — METHOCARBAMOL 1000 MG/10ML IJ SOLN
500.0000 mg | Freq: Four times a day (QID) | INTRAVENOUS | Status: DC | PRN
  Filled 2019-08-05: qty 5

## 2019-08-05 MED ORDER — DIPHENHYDRAMINE HCL 12.5 MG/5ML PO ELIX
12.5000 mg | ORAL_SOLUTION | ORAL | Status: DC | PRN
Start: 2019-08-05 — End: 2019-08-06
  Filled 2019-08-05: qty 10

## 2019-08-05 MED ORDER — TRANEXAMIC ACID-NACL 1000-0.7 MG/100ML-% IV SOLN
INTRAVENOUS | Status: AC
  Filled 2019-08-05: qty 100

## 2019-08-05 SURGICAL SUPPLY — 54 items
BALL HIP CERAMIC (Hips) ×1 IMPLANT
BENZOIN TINCTURE PRP APPL 2/3 (GAUZE/BANDAGES/DRESSINGS) ×2 IMPLANT
BLADE CLIPPER SURG (BLADE) IMPLANT
BLADE SAW SGTL 18X1.27X75 (BLADE) ×2 IMPLANT
COVER SURGICAL LIGHT HANDLE (MISCELLANEOUS) ×2 IMPLANT
COVER WAND RF STERILE (DRAPES) ×2 IMPLANT
CUP SECTOR GRIPTON 50MM (Cup) ×2 IMPLANT
DRAPE C-ARM 42X72 X-RAY (DRAPES) ×2 IMPLANT
DRAPE STERI IOBAN 125X83 (DRAPES) ×2 IMPLANT
DRAPE U-SHAPE 47X51 STRL (DRAPES) ×6 IMPLANT
DRSG AQUACEL AG ADV 3.5X10 (GAUZE/BANDAGES/DRESSINGS) ×2 IMPLANT
DURAPREP 26ML APPLICATOR (WOUND CARE) ×2 IMPLANT
ELECT BLADE 4.0 EZ CLEAN MEGAD (MISCELLANEOUS) ×2
ELECT BLADE 6.5 EXT (BLADE) IMPLANT
ELECT REM PT RETURN 9FT ADLT (ELECTROSURGICAL) ×2
ELECTRODE BLDE 4.0 EZ CLN MEGD (MISCELLANEOUS) ×1 IMPLANT
ELECTRODE REM PT RTRN 9FT ADLT (ELECTROSURGICAL) ×1 IMPLANT
FACESHIELD WRAPAROUND (MASK) ×4 IMPLANT
GLOVE BIOGEL PI IND STRL 8 (GLOVE) ×2 IMPLANT
GLOVE BIOGEL PI INDICATOR 8 (GLOVE) ×2
GLOVE ECLIPSE 8.0 STRL XLNG CF (GLOVE) ×2 IMPLANT
GLOVE ORTHO TXT STRL SZ7.5 (GLOVE) ×4 IMPLANT
GOWN STRL REUS W/ TWL LRG LVL3 (GOWN DISPOSABLE) ×2 IMPLANT
GOWN STRL REUS W/ TWL XL LVL3 (GOWN DISPOSABLE) ×2 IMPLANT
GOWN STRL REUS W/TWL LRG LVL3 (GOWN DISPOSABLE) ×2
GOWN STRL REUS W/TWL XL LVL3 (GOWN DISPOSABLE) ×2
HANDPIECE INTERPULSE COAX TIP (DISPOSABLE) ×1
HIP BALL CERAMIC (Hips) ×2 IMPLANT
KIT BASIN OR (CUSTOM PROCEDURE TRAY) ×2 IMPLANT
KIT TURNOVER KIT B (KITS) ×2 IMPLANT
LINER ACETABULAR 32X50 (Liner) ×2 IMPLANT
MANIFOLD NEPTUNE II (INSTRUMENTS) ×2 IMPLANT
NS IRRIG 1000ML POUR BTL (IV SOLUTION) ×2 IMPLANT
PACK TOTAL JOINT (CUSTOM PROCEDURE TRAY) ×2 IMPLANT
PAD ARMBOARD 7.5X6 YLW CONV (MISCELLANEOUS) ×2 IMPLANT
SCREW 6.5MMX25MM (Screw) ×2 IMPLANT
SET HNDPC FAN SPRY TIP SCT (DISPOSABLE) ×1 IMPLANT
STAPLER VISISTAT 35W (STAPLE) IMPLANT
STEM FEM ACTIS HIGH SZ3 (Stem) ×2 IMPLANT
STRIP CLOSURE SKIN 1/2X4 (GAUZE/BANDAGES/DRESSINGS) ×4 IMPLANT
SUT ETHIBOND NAB CT1 #1 30IN (SUTURE) ×2 IMPLANT
SUT MNCRL AB 4-0 PS2 18 (SUTURE) IMPLANT
SUT VIC AB 0 CT1 27 (SUTURE) ×1
SUT VIC AB 0 CT1 27XBRD ANBCTR (SUTURE) ×1 IMPLANT
SUT VIC AB 1 CT1 27 (SUTURE) ×1
SUT VIC AB 1 CT1 27XBRD ANBCTR (SUTURE) ×1 IMPLANT
SUT VIC AB 2-0 CT1 27 (SUTURE) ×1
SUT VIC AB 2-0 CT1 TAPERPNT 27 (SUTURE) ×1 IMPLANT
TOWEL GREEN STERILE (TOWEL DISPOSABLE) ×2 IMPLANT
TOWEL GREEN STERILE FF (TOWEL DISPOSABLE) ×2 IMPLANT
TRAY CATH 16FR W/PLASTIC CATH (SET/KITS/TRAYS/PACK) IMPLANT
TRAY FOLEY W/BAG SLVR 16FR (SET/KITS/TRAYS/PACK)
TRAY FOLEY W/BAG SLVR 16FR ST (SET/KITS/TRAYS/PACK) IMPLANT
WATER STERILE IRR 1000ML POUR (IV SOLUTION) ×4 IMPLANT

## 2019-08-05 NOTE — Anesthesia Procedure Notes (Signed)
Spinal  Patient location during procedure: OR Start time: 08/05/2019 12:30 PM End time: 08/05/2019 12:36 PM Staffing Anesthesiologist: Myrtie Soman, MD Preanesthetic Checklist Completed: patient identified, IV checked, site marked, risks and benefits discussed, surgical consent, monitors and equipment checked, pre-op evaluation and timeout performed Spinal Block Patient position: sitting Prep: DuraPrep Patient monitoring: heart rate, cardiac monitor, continuous pulse ox and blood pressure Approach: midline Location: L3-4 Injection technique: single-shot Needle Needle type: Sprotte  Needle gauge: 24 G Needle length: 9 cm Assessment Sensory level: T6

## 2019-08-05 NOTE — Evaluation (Signed)
Physical Therapy Evaluation Patient Details Name: Sarah Mullen MRN: [id number] DOB: Jan 09, 1956 Today's Date: 08/05/2019   History of Present Illness  Patient is a 64 y.o female admitted s/p R THA.  Clinical Impression  Patient presents with mobility limited due to numbness R LE and decreased strength and pain.  She will benefit from skilled PT in the acute setting prior to d/c home with spouse assist and follow up PT as planned per MD.      Follow Up Recommendations Follow surgeon's recommendation for DC plan and follow-up therapies    Equipment Recommendations  None recommended by PT    Recommendations for Other Services       Precautions / Restrictions Precautions Precautions: None Restrictions Weight Bearing Restrictions: No Other Position/Activity Restrictions: WBAT      Mobility  Bed Mobility Overal bed mobility: Needs Assistance Bed Mobility: Supine to Sit;Sit to Supine     Supine to sit: Supervision;HOB elevated Sit to supine: Supervision   General bed mobility comments: cues for technique to scoot up in bed, assist for lines  Transfers Overall transfer level: Needs assistance Equipment used: Rolling walker (2 wheeled) Transfers: Sit to/from Stand Sit to Stand: From elevated surface;Min assist         General transfer comment: up to RW, but R leg still numb and wobbly when attempting weight bearing so returned to sitting  Ambulation/Gait             General Gait Details: NT  Stairs            Wheelchair Mobility    Modified Rankin (Stroke Patients Only)       Balance Overall balance assessment: No apparent balance deficits (not formally assessed)                                           Pertinent Vitals/Pain Pain Assessment: Faces Faces Pain Scale: Hurts whole lot Pain Location: R hip Pain Descriptors / Indicators: Burning Pain Intervention(s): Monitored during session;Repositioned    Home Living  Family/patient expects to be discharged to:: Private residence Living Arrangements: Spouse/significant other Available Help at Discharge: Family Type of Home: House Home Access: Stairs to enter   Technical brewer of Steps: 1 Home Layout: Two level;Able to live on main level with bedroom/bathroom Home Equipment: Gilford Rile - 2 wheels Additional Comments: thinks she has a walker spouse to check    Prior Function Level of Independence: Independent         Comments: RN on 6E     Hand Dominance        Extremity/Trunk Assessment        Lower Extremity Assessment Lower Extremity Assessment: RLE deficits/detail RLE Deficits / Details: AROM WFL, strength at least 3/5, still numb from spinal RLE Sensation: decreased light touch       Communication   Communication: No difficulties  Cognition Arousal/Alertness: Awake/alert Behavior During Therapy: WFL for tasks assessed/performed Overall Cognitive Status: Within Functional Limits for tasks assessed                                        General Comments      Exercises Total Joint Exercises Ankle Circles/Pumps: AROM;Supine;Both;5 reps Quad Sets: AROM;Supine;Both Heel Slides: AROM;Supine;Both Hip ABduction/ADduction: AROM;Right;Supine   Assessment/Plan    PT  Assessment Patient needs continued PT services  PT Problem List Decreased mobility;Decreased activity tolerance;Pain;Decreased strength;Decreased balance       PT Treatment Interventions DME instruction;Therapeutic activities;Gait training;Therapeutic exercise;Patient/family education;Stair training;Functional mobility training    PT Goals (Current goals can be found in the Care Plan section)  Acute Rehab PT Goals Patient Stated Goal: to go home PT Goal Formulation: With patient Time For Goal Achievement: 08/08/19 Potential to Achieve Goals: Good    Frequency 7X/week   Barriers to discharge        Co-evaluation                AM-PAC PT "6 Clicks" Mobility  Outcome Measure Help needed turning from your back to your side while in a flat bed without using bedrails?: None Help needed moving from lying on your back to sitting on the side of a flat bed without using bedrails?: None Help needed moving to and from a bed to a chair (including a wheelchair)?: A Little Help needed standing up from a chair using your arms (e.g., wheelchair or bedside chair)?: A Little Help needed to walk in hospital room?: A Little Help needed climbing 3-5 steps with a railing? : A Little 6 Click Score: 20    End of Session   Activity Tolerance: Patient tolerated treatment well Patient left: in bed Nurse Communication: Mobility status PT Visit Diagnosis: Difficulty in walking, not elsewhere classified (R26.2);Pain Pain - Right/Left: Right Pain - part of body: Hip    Time: 1700-1715 PT Time Calculation (min) (ACUTE ONLY): 15 min   Charges:   PT Evaluation $PT Eval Low Complexity: [place], [place] Acute Rehabilitation Services [phone] 08/05/2019   Sarah Mullen 08/05/2019, 5:38 PM

## 2019-08-05 NOTE — Op Note (Signed)
NAME: Stradford, Juwana A. MEDICAL RECORD [id number] ACCOUNT [id number] DATE OF BIRTH:Jun 07, 1956 FACILITY: [facility] LOCATION: [facility] PHYSICIAN:Adriana Lina Kerry [place], MD  OPERATIVE REPORT  DATE OF PROCEDURE:  08/05/2019  PREOPERATIVE DIAGNOSIS:  Primary osteoarthritis and degenerative joint disease with a slightly dysplastic right hip.  POSTOPERATIVE DIAGNOSIS:  Primary osteoarthritis and degenerative joint disease with a slightly dysplastic right hip.  PROCEDURE:  Right total hip arthroplasty through direct anterior approach.  IMPLANTS:  DePuy Sector Gription acetabular component size 50, with a single screw, size 32+0 neutral polyethylene liner, size 3 Actis femoral component with high offset, size 32+5 ceramic hip ball.  SURGEON:  Lind Guest. Ninfa Linden, MD  ASSISTANT:  Erskine Emery, PA-C.  ANESTHESIA:  Spinal.  ANTIBIOTICS:  900 mg IV clindamycin.  ESTIMATED BLOOD LOSS:  200 mL.  COMPLICATIONS:  None.  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  This has been hurting for several years now.  She does have a slight leg length discrepancy.  Her x-rays do show evidence of  significant osteoarthritis of her right hip.  There is also some evidence of uncoverage as if she has had a dysplastic hip as well.  After trial and failure of conservative treatment, we talked about the possibility of hip replacement surgery.  Her pain   is daily and at this point is detrimentally affecting her mobility, her quality of life and her activities of daily living.  It does affect her job to the point she does wish to proceed with a total hip arthroplasty.  We talked about the risk of acute  blood loss anemia, nerve or vessel injury, fracture, infection, DVT, dislocation and implant failure.  We talked about our goals being decreased pain, improve mobility and hopefully overall improved quality of life.  DESCRIPTION OF  PROCEDURE:  After informed consent was obtained, the appropriate right hip was marked.  She was brought to the operating room and sat up on a stretcher.  Spinal anesthesia was obtained.  She was then laid in supine position on a stretcher.   Foley catheter was placed and both feet had traction boots applied to them.  Next, we had her placed supine on the [facility] operative table with a perineal post in place and both legs in line skeletal traction devices, but no traction applied.  Her right  operative hip was prepped and draped with DuraPrep and sterile drapes.  Time-out was called.  She was identified as correct patient and correct right hip.  I then tested the skin,  pinching it to make sure anesthesia was adequate with spinal anesthesia.   We then made an incision just inferior and posterior to the anterior superior iliac spine and carried this slightly obliquely down the leg.  We dissected down to the tensor fascia lata muscle.  Tensor fascia was then divided longitudinally to proceed  with direct anterior approach to the hip.  We identified and cauterized circumflex vessels, then identified the hip capsule, opened up the hip capsule in an L-type format, finding moderate joint effusion and significant periarticular osteophytes around  the lateral femoral head and neck.  We placed curved retractors inside the joint capsule and the lateral medial femoral neck and I made our femoral neck cut with an oscillating saw just proximal to the lesser trochanter and completed this with an  osteotome.  I placed a corkscrew guide in the femoral head and removed the femoral head in its entirety and  found a wide area of the superolateral aspect that was completely devoid of cartilage.  I then placed a bent Hohmann over the medial acetabular  rim, removed remnants of the acetabular labrum and other debris and then began reaming in stepwise increments from a size 43 reamer, going up to a size 49.  All reamers were placed  under direct visualization.  The last reamer was also placed under direct  fluoroscopy, so we could obtain our depth of reaming, our inclination and anteversion.  I then placed the real DePuy Sector Gription acetabular component size 50.  I did place a single screw due to slight overhang.  This measured 25 mm.  I then went  with a 32+0  neutral polyethylene liner.  Attention was then turned to the femur.  With the leg externally rotated to 120 degrees, extended and adducted, I was able to place a Mueller retractor medially and a Hohman retractor behind the greater  trochanter.  We used a box-cutting osteotome to enter the femoral canal.  I was able to lateralize slightly.  I then began broaching using the Actis broaching system from a size zero going up to a size 3.  With the size 3 in place, we felt like we had a  tight fit, so we trialed a standard offset femoral neck and a 32+1 hip ball.  We brought the leg back over and up, put traction, internal rotation, reduces easily in the pelvis and based on my clinical exam findings as well as radiographic findings, I  felt like she needed definitely more leg length and offset.  We then removed the trial components and I went with the real high offset femoral component, which is the Actis size 3 and based on wanting to get a little more leg length as well, we went with  the 32+5 ceramic hip ball, reduced this in the acetabulum and I was pleased of her clinical exam, assessing the stability mechanically, as well as assessing radiographically her leg length and offset.  We then irrigated the soft tissue with normal  saline solution using pulsatile lavage.  We closed the joint capsule with interrupted #1 Ethibond suture.  Number 1 Vicryl was used to reapproximate the tensor fascia, 0 Vicryl to close the deep tissue, 2-0 Vicryl to close the subcutaneous tissue and  decided to close the skin with staples.  Xeroform and Aquacel dressing were applied.  She was taken off  the Hana table and taken to the recovery room in stable condition.  All final counts were correct.  There were no complications noted.  Of note, Benita Stabile, PA-C assisted during the entire case.  His assistance was crucial for facilitating all aspects of this case.  VN/NUANCE  D:08/05/2019 T:08/05/2019 JOB:[id number]

## 2019-08-05 NOTE — H&P (Signed)
TOTAL HIP ADMISSION H&P  Patient is admitted for right total hip arthroplasty.  Subjective:  Chief Complaint: right hip pain  HPI: Sarah Mullen, 64 y.o. female, has a history of pain and functional disability in the right hip(s) due to arthritis and patient has failed non-surgical conservative treatments for greater than 12 weeks to include NSAID's and/or analgesics, corticosteriod injections, weight reduction as appropriate and activity modification.  Onset of symptoms was gradual starting 2 years ago with gradually worsening course since that time.The patient noted no past surgery on the right hip(s).  Patient currently rates pain in the right hip at 10 out of 10 with activity. Patient has night pain, worsening of pain with activity and weight bearing, trendelenberg gait, pain that interfers with activities of daily living, pain with passive range of motion and joint swelling. Patient has evidence of subchondral cysts, subchondral sclerosis, periarticular osteophytes and joint space narrowing by imaging studies. This condition presents safety issues increasing the risk of falls.  There is no current active infection.  Patient Active Problem List   Diagnosis Date Noted  . Unilateral primary osteoarthritis, right hip 06/23/2019  . Nonallopathic lesion of sacral region 06/13/2019  . Nonallopathic lesion of lumbosacral region 06/13/2019  . Nonallopathic lesion of thoracic region 06/13/2019  . Right hip pain 06/13/2019  . Left knee pain 01/17/2019  . Spinal stenosis in cervical region 03/27/2018  . Testosterone deficiency 08/22/2017  . Hyperlipidemia 08/22/2017  . Chronic lower back pain 08/22/2017  . Cervical myelopathy ([place]) 05/15/2016  . Vitamin D deficiency 05/15/2016  . Prediabetes 05/14/2016  . Fatty liver 03/09/2016  . Positive TB test 05/28/2012  . GERD (gastroesophageal reflux disease) 11/21/2010  . Dyshidrotic eczema 11/21/2010   Past Medical History:  Diagnosis Date  . Allergic  rhinitis, cause unspecified   . Blood transfusion    iron transfusion- [place]-2009  . Chronic headaches   . Depression   . Diverticulitis   . GERD (gastroesophageal reflux disease)   . Heart murmur    diag with echo-physiologic murmur; trace MR, trivial, early systolic MR by [id number] echo ([facility])  . History of sinus tachycardia    07/04/07 Holter monitor: 69-154, average HR 95 bpm. Occ PACs, 4 runs probable atrial tachycardia  . Menometrorrhagia 11/21/2010  . Migraines   . Pre-diabetes     Past Surgical History:  Procedure Laterality Date  . ABDOMINAL HYSTERECTOMY  09/2011  . ANTERIOR CERVICAL DECOMP/DISCECTOMY FUSION N/A 03/27/2018   Procedure: Anteroir Cervical Decrompression Fusion - Cervical Three - Cervical Four - Cervical Four - Cervical Five - Cervical Five - Cervical Six;  Surgeon: Kary Kos, MD;  Location: [facility];  Service: Neurosurgery;  Laterality: N/A;  Anteroir Cervical Decrompression Fusion - Cervical Three - Cervical Four - Cervical Four - Cervical Five - Cervical Five - Cervical Six  . BREAST BIOPSY     right breast/benign  . CERCLAGE REMOVAL  1986   WHEN pregnant  . [place]   1 time  . COLONOSCOPY    . CYSTOSCOPY  10/17/2011   Procedure: CYSTOSCOPY;  Surgeon: Megan Salon, MD;  Location: [facility];  Service: Gynecology;;  . DILATION AND CURETTAGE OF UTERUS    . ESOPHAGOGASTRODUODENOSCOPY    . TONSILLECTOMY      Current Facility-Administered Medications  Medication Dose Route Frequency Provider Last Rate Last Admin  . chlorhexidine (HIBICLENS) 4 % liquid 4 application  60 mL Topical Once Pete Pelt, PA-C      .  clindamycin (CLEOCIN) 900 MG/50ML IVPB           . clindamycin (CLEOCIN) IVPB 900 mg  900 mg Intravenous On Call to OR Pete Pelt, PA-C      . lactated ringers infusion   Intravenous Continuous Myrtie Soman, MD 10 mL/hr at 08/05/19 1130 New Bag at 08/05/19 1130  . povidone-iodine 10 % swab 2 application  2  application Topical Once Erskine Emery W, PA-C      . tranexamic acid (CYKLOKAPRON) 1000MG /146mL IVPB           . tranexamic acid (CYKLOKAPRON) IVPB 1,000 mg  1,000 mg Intravenous To OR Pete Pelt, PA-C       Allergies  Allergen Reactions  . Kiwi Extract     Throat itching   . Contrast Media [Iodinated Diagnostic Agents] Rash  . Keflex [Cephalexin] Rash    Social History   Tobacco Use  . Smoking status: Former Smoker    Types: Cigarettes    Quit date: 10/09/2001    Years since quitting: 17.8  . Smokeless tobacco: Never Used  Substance Use Topics  . Alcohol use: No    Alcohol/week: 0.0 standard drinks    Family History  Problem Relation Age of Onset  . Arthritis Mother   . Hyperlipidemia Mother   . Heart disease Mother   . Arthritis Father   . Lung cancer Father        smoker  . Heart disease Father   . Hypertension Father   . Diabetes Father   . Colon cancer Paternal Aunt   . CAD Paternal Grandmother   . Kidney disease Maternal Grandfather        ESRD on HD  . Kidney disease Maternal Grandmother        ESRD on HD  . Diabetes Brother   . Metabolic syndrome Sister   . Stomach cancer Neg Hx   . Rectal cancer Neg Hx   . Esophageal cancer Neg Hx      Review of Systems  Musculoskeletal: Positive for joint swelling.  All other systems reviewed and are negative.   Objective:  Physical Exam  Constitutional: She is oriented to person, place, and time. She appears well-developed and well-nourished.  HENT:  Head: Normocephalic and atraumatic.  Eyes: EOM are normal.  Cardiovascular: Normal rate and regular rhythm.  Respiratory: Effort normal and breath sounds normal.  GI: Soft.  Musculoskeletal:     Cervical back: Normal range of motion and neck supple.     Right hip: Tenderness and bony tenderness present. Decreased range of motion. Decreased strength.  Neurological: She is alert and oriented to person, place, and time.  Skin: Skin is warm and dry.   Psychiatric: She has a normal mood and affect.    Vital signs in last 24 hours: Temp:  [98.4 F (36.9 C)] 98.4 F (36.9 C) (01/12 1045) Pulse Rate:  [97] 97 (01/12 1045) Resp:  [18] 18 (01/12 1045) BP: (144)/(97) 144/97 (01/12 1045) SpO2:  [99 %] 99 % (01/12 1045) Weight:  [76.8 kg] 76.8 kg (01/12 1045)  Labs:   Estimated body mass index is 33.07 kg/m as calculated from the following:   Height as of this encounter: 5' (1.524 m).   Weight as of this encounter: 76.8 kg.   Imaging Review Plain radiographs demonstrate severe degenerative joint disease of the right hip(s). The bone quality appears to be excellent for age and reported activity level.      Assessment/Plan:  End stage arthritis, right hip(s)  The patient history, physical examination, clinical judgement of the provider and imaging studies are consistent with end stage degenerative joint disease of the right hip(s) and total hip arthroplasty is deemed medically necessary. The treatment options including medical management, injection therapy, arthroscopy and arthroplasty were discussed at length. The risks and benefits of total hip arthroplasty were presented and reviewed. The risks due to aseptic loosening, infection, stiffness, dislocation/subluxation,  thromboembolic complications and other imponderables were discussed.  The patient acknowledged the explanation, agreed to proceed with the plan and consent was signed. Patient is being admitted for inpatient treatment for surgery, pain control, PT, OT, prophylactic antibiotics, VTE prophylaxis, progressive ambulation and ADL's and discharge planning.The patient is planning to be discharged home with home health services    Patient's anticipated LOS is less than 2 midnights, meeting these requirements: - Younger than 48 - Lives within 1 hour of care - Has a competent adult at home to recover with post-op recover - NO history of  - Chronic pain requiring opiods  -  Diabetes  - Coronary Artery Disease  - Heart failure  - Heart attack  - Stroke  - DVT/VTE  - Cardiac arrhythmia  - Respiratory Failure/COPD  - Renal failure  - Anemia  - Advanced Liver disease

## 2019-08-05 NOTE — Transfer of Care (Signed)
Immediate Anesthesia Transfer of Care Note  Patient: Sarah Mullen  Procedure(s) Performed: RIGHT TOTAL HIP ARTHROPLASTY ANTERIOR APPROACH (Right Hip)  Patient Location: PACU  Anesthesia Type:Spinal  Level of Consciousness: awake, alert  and oriented  Airway & Oxygen Therapy: Patient Spontanous Breathing and Patient connected to face mask oxygen  Post-op Assessment: Report given to RN and Post -op Vital signs reviewed and stable  Post vital signs: Reviewed and stable  Last Vitals:  Vitals Value Taken Time  BP 78/53 08/05/19 1411  Temp    Pulse 91 08/05/19 1415  Resp 20 08/05/19 1415  SpO2 100 % 08/05/19 1415  Vitals shown include unvalidated device data.  Last Pain:  Vitals:   08/05/19 1110  TempSrc:   PainSc: 0-No pain         Complications: No apparent anesthesia complications

## 2019-08-05 NOTE — Brief Op Note (Signed)
08/05/2019  1:58 PM  PATIENT:  Sarah Mullen  64 y.o. female  PRE-OPERATIVE DIAGNOSIS:  osteoarthritis right hip  POST-OPERATIVE DIAGNOSIS:  osteoarthritis right hip  PROCEDURE:  Procedure(s): RIGHT TOTAL HIP ARTHROPLASTY ANTERIOR APPROACH (Right)  SURGEON:  Surgeon(s) and Role:    Mcarthur Rossetti, MD - Primary  PHYSICIAN ASSISTANT: Benita Stabile, PA-C  ANESTHESIA:   spinal  EBL:  200 mL   COUNTS:  YES  DICTATION: .Other Dictation: Dictation Number [id number]  PLAN OF CARE: Admit for overnight observation  PATIENT DISPOSITION:  PACU - hemodynamically stable.   Delay start of Pharmacological VTE agent (>24hrs) due to surgical blood loss or risk of bleeding: no

## 2019-08-06 ENCOUNTER — Encounter: Payer: Self-pay | Admitting: *Deleted

## 2019-08-06 LAB — CBC
HCT: 37 % (ref 36.0–46.0)
Hemoglobin: 11.4 g/dL — ABNORMAL LOW (ref 12.0–15.0)
MCH: 28.3 pg (ref 26.0–34.0)
MCHC: 30.8 g/dL (ref 30.0–36.0)
MCV: 91.8 fL (ref 80.0–100.0)
Platelets: 340 10*3/uL (ref 150–400)
RBC: 4.03 MIL/uL (ref 3.87–5.11)
RDW: 13.6 % (ref 11.5–15.5)
WBC: 12 10*3/uL — ABNORMAL HIGH (ref 4.0–10.5)
nRBC: 0 % (ref 0.0–0.2)

## 2019-08-06 LAB — BASIC METABOLIC PANEL
Anion gap: 7 (ref 5–15)
BUN: 22 mg/dL (ref 8–23)
CO2: 28 mmol/L (ref 22–32)
Calcium: 9.4 mg/dL (ref 8.9–10.3)
Chloride: 105 mmol/L (ref 98–111)
Creatinine, Ser: 0.87 mg/dL (ref 0.44–1.00)
GFR calc Af Amer: >60 mL/min (ref >60)
GFR calc non Af Amer: >60 mL/min (ref >60)
Glucose, Bld: 178 mg/dL — ABNORMAL HIGH (ref 70–99)
Potassium: 4.5 mmol/L (ref 3.5–5.1)
Sodium: 140 mmol/L (ref 135–145)

## 2019-08-06 MED ORDER — OXYCODONE HCL 5 MG PO TABS: 5.0000 mg | ORAL_TABLET | ORAL | 0 refills | Status: DC | PRN

## 2019-08-06 MED ORDER — METHOCARBAMOL 500 MG PO TABS: 500.0000 mg | ORAL_TABLET | Freq: Four times a day (QID) | ORAL | 1 refills | Status: DC | PRN

## 2019-08-06 MED ORDER — ASPIRIN 81 MG PO CHEW: 81.0000 mg | CHEWABLE_TABLET | Freq: Two times a day (BID) | ORAL | 0 refills | Status: DC

## 2019-08-06 MED FILL — oxyCODONE HCL 5 MG TABS: 5 | 3 days supply | Qty: 30 | Fill #0

## 2019-08-06 MED FILL — METHOCARBAMOL 500 MG TABS: 500 | 15 days supply | Qty: 60 | Fill #0

## 2019-08-06 NOTE — Discharge Summary (Signed)
Patient ID: Sarah Mullen MRN: [id number] DOB/AGE: 1956-03-30 64 y.o.  Admit date: 08/05/2019 Discharge date: 08/06/2019  Admission Diagnoses:  Principal Problem:   Unilateral primary osteoarthritis, right hip Active Problems:   Status post total replacement of right hip   Discharge Diagnoses:  Same  Past Medical History:  Diagnosis Date  . Allergic rhinitis, cause unspecified   . Blood transfusion    iron transfusion- [place]-2009  . Chronic headaches   . Depression   . Diverticulitis   . GERD (gastroesophageal reflux disease)   . Heart murmur    diag with echo-physiologic murmur; trace MR, trivial, early systolic MR by [id number] echo ([facility])  . History of sinus tachycardia    07/04/07 Holter monitor: 69-154, average HR 95 bpm. Occ PACs, 4 runs probable atrial tachycardia  . Menometrorrhagia 11/21/2010  . Migraines   . Pre-diabetes     Surgeries: Procedure(s): RIGHT TOTAL HIP ARTHROPLASTY ANTERIOR APPROACH on 08/05/2019   Consultants:   Discharged Condition: Improved  Hospital Course: Sarah Mullen is an 64 y.o. female who was admitted 08/05/2019 for operative treatment ofUnilateral primary osteoarthritis, right hip. Patient has severe unremitting pain that affects sleep, daily activities, and work/hobbies. After pre-op clearance the patient was taken to the operating room on 08/05/2019 and underwent  Procedure(s): RIGHT TOTAL HIP ARTHROPLASTY ANTERIOR APPROACH.    Patient was given perioperative antibiotics:  Anti-infectives (From admission, onward)   Start     Dose/Rate Route Frequency Ordered Stop   08/05/19 1800  clindamycin (CLEOCIN) IVPB 600 mg     600 mg 100 mL/hr over 30 Minutes Intravenous Every 6 hours 08/05/19 1634 08/05/19 2347   08/05/19 1230  clindamycin (CLEOCIN) IVPB 900 mg     900 mg 100 mL/hr over 30 Minutes Intravenous On call to O.R. 08/05/19 1049 08/05/19 1309   08/05/19 1058  clindamycin (CLEOCIN) 900 MG/50ML IVPB    Note to  Pharmacy: Cordelia Pen   : cabinet override      08/05/19 1058 08/05/19 1249       Patient was given sequential compression devices, early ambulation, and chemoprophylaxis to prevent DVT.  Patient benefited maximally from hospital stay and there were no complications.    Recent vital signs:  Patient Vitals for the past 24 hrs:  BP Temp Temp src Pulse Resp SpO2 Height Weight  08/06/19 0550 131/70 98.9 F (37.2 C) Oral 99 18 93 % -- --  08/05/19 2314 129/65 98.8 F (37.1 C) Oral (!) 106 18 92 % -- --  08/05/19 1933 122/87 97.9 F (36.6 C) Oral (!) 102 20 97 % -- --  08/05/19 1636 101/65 -- -- 88 18 95 % -- --  08/05/19 1600 -- (!) 97.2 F (36.2 C) -- -- -- -- -- --  08/05/19 1519 97/70 -- -- 84 19 100 % -- --  08/05/19 1505 93/61 -- -- 80 16 96 % -- --  08/05/19 1450 91/62 -- -- 81 16 96 % -- --  08/05/19 1425 (!) 95/51 -- -- 88 20 99 % -- --  08/05/19 1411 (!) 78/53 -- -- 86 14 100 % -- --  08/05/19 1410 (!) 79/52 (!) 97.2 F (36.2 C) -- 89 17 100 % -- --  08/05/19 1045 (!) 144/97 98.4 F (36.9 C) Oral 97 18 99 % 5' (1.524 m) 76.8 kg     Recent laboratory studies:  Recent Labs    08/06/19 0405  WBC 12.0*  HGB 11.4*  HCT  37.0  PLT 340  NA 140  K 4.5  CL 105  CO2 28  BUN 22  CREATININE 0.87  GLUCOSE 178*  CALCIUM 9.4     Discharge Medications:   Allergies as of 08/06/2019      Reactions   Kiwi Extract    Throat itching    Contrast Media [iodinated Diagnostic Agents] Rash   Keflex [cephalexin] Rash      Medication List    STOP taking these medications   aspirin EC 81 MG tablet Replaced by: aspirin 81 MG chewable tablet     TAKE these medications   aspirin 81 MG chewable tablet Chew 1 tablet (81 mg total) by mouth 2 (two) times daily. Replaces: aspirin EC 81 MG tablet   aspirin-acetaminophen-caffeine 250-250-65 MG tablet Commonly known as: EXCEDRIN MIGRAINE Take 2 tablets by mouth daily as needed for headache or migraine.   cetirizine 10 MG  tablet Commonly known as: ZYRTEC Take 10 mg by mouth daily.   cyclobenzaprine 10 MG tablet Commonly known as: FLEXERIL Take 0.5-1 tablets (5-10 mg total) by mouth at bedtime. What changed:   how much to take  when to take this  reasons to take this   DULoxetine 60 MG capsule Commonly known as: CYMBALTA Take 1 capsule (60 mg total) by mouth daily.   gabapentin 100 MG capsule Commonly known as: NEURONTIN Take 2 capsules (200 mg total) by mouth at bedtime.   methocarbamol 500 MG tablet Commonly known as: ROBAXIN Take 1 tablet (500 mg total) by mouth every 6 (six) hours as needed for muscle spasms.   NONFORMULARY OR COMPOUNDED ITEM Estradiol cream 0.2% with testosterone 1% cream, Apply 1/2 ml topically every morning as directed.  #12ml grams   oxyCODONE 5 MG immediate release tablet Commonly known as: Oxy IR/ROXICODONE Take 1-2 tablets (5-10 mg total) by mouth every 4 (four) hours as needed for moderate pain (pain score 4-6).   pantoprazole 40 MG tablet Commonly known as: PROTONIX Take 1 tablet (40 mg total) by mouth daily.            Durable Medical Equipment  (From admission, onward)         Start     Ordered   08/05/19 1635  DME Walker rolling  Once    Question Answer Comment  Walker: With 5 Inch Wheels   Patient needs a walker to treat with the following condition Status post total replacement of right hip      08/05/19 1634   08/05/19 1635  DME 3 n 1  Once     08/05/19 1634          Diagnostic Studies: DG Pelvis Portable  Result Date: 08/05/2019 CLINICAL DATA:  Status post total replacement of right hip. EXAM: PORTABLE PELVIS 1-2 VIEWS COMPARISON:  Radiographs of the right hip 06/13/2019 FINDINGS: Right total hip arthroplasty. The femoral and acetabular components are well seated. No apparent adverse features. Overlying skin staples. IMPRESSION: Right total hip arthroplasty without adverse features. Electronically Signed   By: Kellie Simmering DO   On:  08/05/2019 14:56   DG C-Arm 1-60 Min  Result Date: 08/05/2019 CLINICAL DATA:  Status post right total hip arthroplasty. EXAM: OPERATIVE right HIP (WITH PELVIS IF PERFORMED) 4 VIEWS TECHNIQUE: Fluoroscopic spot image(s) were submitted for interpretation post-operatively. FLUOROSCOPY TIME:  15 seconds. COMPARISON:  June 13, 2019. FINDINGS: Four intraoperative fluoroscopic images of the right hip demonstrate the right acetabular and femoral components to be well situated. IMPRESSION: Fluoroscopic  guidance provided during right total hip arthroplasty. Electronically Signed   By: Marijo Conception M.D.   On: 08/05/2019 14:21   DG HIP OPERATIVE UNILAT WITH PELVIS RIGHT  Result Date: 08/05/2019 CLINICAL DATA:  Status post right total hip arthroplasty. EXAM: OPERATIVE right HIP (WITH PELVIS IF PERFORMED) 4 VIEWS TECHNIQUE: Fluoroscopic spot image(s) were submitted for interpretation post-operatively. FLUOROSCOPY TIME:  15 seconds. COMPARISON:  June 13, 2019. FINDINGS: Four intraoperative fluoroscopic images of the right hip demonstrate the right acetabular and femoral components to be well situated. IMPRESSION: Fluoroscopic guidance provided during right total hip arthroplasty. Electronically Signed   By: Marijo Conception M.D.   On: 08/05/2019 14:21    Disposition: Discharge disposition: 01-Home or Self Care         Follow-up Information    Mcarthur Rossetti, MD. Schedule an appointment as soon as possible for a visit in 2 week(s).   Specialty: Orthopedic Surgery Contact information: [address] [place] [address] [phone]            Signed: Mcarthur Rossetti 08/06/2019, 7:17 AM

## 2019-08-06 NOTE — Plan of Care (Signed)
Pt and friend given D/C instructions with verbal understanding. Rx's were sent to pharmacy by MD. Pt's incision is clean and dry with no sign of infection. Pt's IV was removed prior to D/C. Pt D/C'd home via wheelchair per MD order. Pt is stable @ D/C and has no other needs at this time. Larkin Ina

## 2019-08-06 NOTE — Progress Notes (Signed)
Subjective: 1 Day Post-Op Procedure(s) (LRB): RIGHT TOTAL HIP ARTHROPLASTY ANTERIOR APPROACH (Right) Patient reports pain as moderate.    Objective: Vital signs in last 24 hours: Temp:  [97.2 F (36.2 C)-98.9 F (37.2 C)] 98.9 F (37.2 C) (01/13 0550) Pulse Rate:  [80-106] 99 (01/13 0550) Resp:  [14-20] 18 (01/13 0550) BP: (78-144)/(51-97) 131/70 (01/13 0550) SpO2:  [92 %-100 %] 93 % (01/13 0550) Weight:  [76.8 kg] 76.8 kg (01/12 1045)  Intake/Output from previous day: 01/12 0701 - 01/13 0700 In: 750 [I.V.:700; IV Piggyback:50] Out: 1000 [Urine:800; Blood:200] Intake/Output this shift: No intake/output data recorded.  Recent Labs    08/06/19 0405  HGB 11.4*   Recent Labs    08/06/19 0405  WBC 12.0*  RBC 4.03  HCT 37.0  PLT 340   Recent Labs    08/06/19 0405  NA 140  K 4.5  CL 105  CO2 28  BUN 22  CREATININE 0.87  GLUCOSE 178*  CALCIUM 9.4   No results for input(s): LABPT, INR in the last 72 hours.  Sensation intact distally Intact pulses distally Dorsiflexion/Plantar flexion intact Incision: dressing C/D/I   Assessment/Plan: 1 Day Post-Op Procedure(s) (LRB): RIGHT TOTAL HIP ARTHROPLASTY ANTERIOR APPROACH (Right) Up with therapy Discharge home with home health today.    Patient's anticipated LOS is less than 2 midnights, meeting these requirements: - Younger than 67 - Lives within 1 hour of care - Has a competent adult at home to recover with post-op recover - NO history of  - Chronic pain requiring opiods  - Diabetes  - Coronary Artery Disease  - Heart failure  - Heart attack  - Stroke  - DVT/VTE  - Cardiac arrhythmia  - Respiratory Failure/COPD  - Renal failure  - Anemia  - Advanced Liver disease       Mcarthur Rossetti 08/06/2019, 7:15 AM

## 2019-08-06 NOTE — Progress Notes (Signed)
Physical Therapy Treatment Patient Details Name: Sarah Mullen MRN: [id number] DOB: 09-17-1955 Today's Date: 08/06/2019    History of Present Illness Patient is a 64 y.o female admitted s/p R THA.    PT Comments    Pt progressing towards physical therapy goals. Was able to perform transfers and ambulation with gross min guard assist and RW for balance support. Required increased cues for sequencing during gait training and general safety awareness throughout session. Will plan to see for second session this afternoon to progress mobility prior to d/c home with partner support.    Follow Up Recommendations  Follow surgeon's recommendation for DC plan and follow-up therapies     Equipment Recommendations  None recommended by PT    Recommendations for Other Services       Precautions / Restrictions Precautions Precautions: None Precaution Comments: Direct anterior approach Restrictions Weight Bearing Restrictions: No Other Position/Activity Restrictions: WBAT    Mobility  Bed Mobility Overal bed mobility: Needs Assistance Bed Mobility: Supine to Sit;Sit to Supine     Supine to sit: Supervision;HOB elevated Sit to supine: Min assist   General bed mobility comments: Assist for LE elevation back up into bed at end of session. Pt was able to transition to EOB without assistance from therapist, however was picking her RLE up with her hands to advance.   Transfers Overall transfer level: Needs assistance Equipment used: Rolling walker (2 wheeled) Transfers: Sit to/from Stand Sit to Stand: Min guard         General transfer comment: Hands-on guarding for safety but pt able to power up to full stand without therapist assistance.   Ambulation/Gait Ambulation/Gait assistance: Min guard Gait Distance (Feet): 200 Feet Assistive device: Rolling walker (2 wheeled) Gait Pattern/deviations: Step-to pattern;Step-through pattern;Decreased stride length;Trunk flexed Gait velocity:  Decreased Gait velocity interpretation: <1.8 ft/sec, indicate of risk for recurrent falls General Gait Details: Antalgic and guarded. Pt was able to progress to step-through gait pattern with VC's for sequencing with the RW.    Stairs             Wheelchair Mobility    Modified Rankin (Stroke Patients Only)       Balance Overall balance assessment: No apparent balance deficits (not formally assessed)                                          Cognition Arousal/Alertness: Awake/alert Behavior During Therapy: WFL for tasks assessed/performed Overall Cognitive Status: Within Functional Limits for tasks assessed                                        Exercises      General Comments        Pertinent Vitals/Pain Pain Assessment: Faces Faces Pain Scale: Hurts whole lot Pain Location: R hip Pain Descriptors / Indicators: Burning Pain Intervention(s): Limited activity within patient's tolerance;Monitored during session;Repositioned    Home Living                      Prior Function            PT Goals (current goals can now be found in the care plan section) Acute Rehab PT Goals Patient Stated Goal: to go home PT Goal Formulation: With patient Time For Goal Achievement:  08/08/19 Potential to Achieve Goals: Good Progress towards PT goals: Progressing toward goals    Frequency    7X/week      PT Plan Current plan remains appropriate    Co-evaluation              AM-PAC PT "6 Clicks" Mobility   Outcome Measure  Help needed turning from your back to your side while in a flat bed without using bedrails?: None Help needed moving from lying on your back to sitting on the side of a flat bed without using bedrails?: None Help needed moving to and from a bed to a chair (including a wheelchair)?: A Little Help needed standing up from a chair using your arms (e.g., wheelchair or bedside chair)?: A Little Help  needed to walk in hospital room?: A Little Help needed climbing 3-5 steps with a railing? : A Little 6 Click Score: 20    End of Session Equipment Utilized During Treatment: Gait belt Activity Tolerance: Patient tolerated treatment well Patient left: in bed Nurse Communication: Mobility status PT Visit Diagnosis: Difficulty in walking, not elsewhere classified (R26.2);Pain Pain - Right/Left: Right Pain - part of body: Hip     Time: [id number] PT Time Calculation (min) (ACUTE ONLY): 39 min  Charges:  $Gait Training: 23-37 mins $Therapeutic Activity: 8-22 mins                     Rolinda Roan, PT, DPT Acute Rehabilitation Services Pager: [phone] Office: [phone]    Sarah Mullen 08/06/2019, 11:59 AM

## 2019-08-06 NOTE — Discharge Instructions (Signed)

## 2019-08-06 NOTE — Progress Notes (Signed)
Physical Therapy Treatment Patient Details Name: Sarah Mullen LANCELLOTTI MRN: [id number] DOB: Sep 09, 1955 Today's Date: 08/06/2019    History of Present Illness Patient is a 64 y.o female admitted s/p R THA.    PT Comments    Mobility limited by lethargy. Issued HEP handout and attempted education however pt unable to stay awake to participate. Anticipate she will progress well as she becomes more alert. Attempted to wait until partner arrived for education, but he was not present during either session today. Educated pt on car transfer and safe entry into home however unsure how much was truly comprehended at this time. She anticipates d/c home this afternoon. Will continue to follow.    Follow Up Recommendations  Follow surgeon's recommendation for DC plan and follow-up therapies     Equipment Recommendations  None recommended by PT    Recommendations for Other Services       Precautions / Restrictions Precautions Precautions: None Precaution Comments: Direct anterior approach Restrictions Weight Bearing Restrictions: No Other Position/Activity Restrictions: WBAT    Mobility  Bed Mobility Overal bed mobility: Needs Assistance Bed Mobility: Supine to Sit     Supine to sit: Supervision;HOB elevated Sit to supine: Min assist   General bed mobility comments: Pt continues to pick her leg up with her hands to advance towards EOB. Pt falling asleep in the middle of the task and required frequent cues to transition fully to EOB.   Transfers Overall transfer level: Needs assistance Equipment used: Rolling walker (2 wheeled) Transfers: Sit to/from Stand Sit to Stand: Min guard         General transfer comment: Hands-on guarding for safety but pt able to power up to full stand without therapist assistance. Increased time  Ambulation/Gait Ambulation/Gait assistance: Min guard Gait Distance (Feet): 150 Feet Assistive device: Rolling walker (2 wheeled) Gait Pattern/deviations: Step-to  pattern;Step-through pattern;Decreased stride length;Trunk flexed Gait velocity: Decreased Gait velocity interpretation: <1.31 ft/sec, indicative of household ambulator General Gait Details: Antalgic and guarded. Pt was able to progress to step-through gait pattern with VC's for sequencing with the RW. Continues to move extremely slowly at times and required occasional cues for eyes open due to lethargy.    Stairs             Wheelchair Mobility    Modified Rankin (Stroke Patients Only)       Balance Overall balance assessment: Mild deficits observed, not formally tested                                          Cognition Arousal/Alertness: Lethargic Behavior During Therapy: WFL for tasks assessed/performed Overall Cognitive Status: Within Functional Limits for tasks assessed                                        Exercises Total Joint Exercises Ankle Circles/Pumps: Right;5 reps Quad Sets: Right;5 reps Long Arc Quad: Right;5 reps    General Comments        Pertinent Vitals/Pain Pain Assessment: Faces Faces Pain Scale: Hurts even more Pain Location: R hip Pain Descriptors / Indicators: Moaning;Operative site guarding Pain Intervention(s): Limited activity within patient's tolerance;Monitored during session;Repositioned    Home Living  Prior Function            PT Goals (current goals can now be found in the care plan section) Acute Rehab PT Goals Patient Stated Goal: to go home PT Goal Formulation: With patient Time For Goal Achievement: 08/08/19 Potential to Achieve Goals: Good Progress towards PT goals: Progressing toward goals    Frequency    7X/week      PT Plan Current plan remains appropriate    Co-evaluation              AM-PAC PT "6 Clicks" Mobility   Outcome Measure  Help needed turning from your back to your side while in a flat bed without using bedrails?:  None Help needed moving from lying on your back to sitting on the side of a flat bed without using bedrails?: None Help needed moving to and from a bed to a chair (including a wheelchair)?: A Little Help needed standing up from a chair using your arms (e.g., wheelchair or bedside chair)?: A Little Help needed to walk in hospital room?: A Little Help needed climbing 3-5 steps with a railing? : A Little 6 Click Score: 20    End of Session Equipment Utilized During Treatment: Gait belt Activity Tolerance: Patient tolerated treatment well Patient left: in chair;with call bell/phone within reach Nurse Communication: Mobility status PT Visit Diagnosis: Difficulty in walking, not elsewhere classified (R26.2);Pain Pain - Right/Left: Right Pain - part of body: Hip     Time: [id number] PT Time Calculation (min) (ACUTE ONLY): 36 min  Charges:  $Gait Training: 23-37 mins $Therapeutic Activity: 8-22 mins                     Rolinda Roan, PT, DPT Acute Rehabilitation Services Pager: [phone] Office: [phone]    KIWANNA BOUSKA 08/06/2019, 2:23 PM

## 2019-08-07 ENCOUNTER — Telehealth: Payer: Self-pay | Admitting: Orthopaedic Surgery

## 2019-08-07 NOTE — Telephone Encounter (Signed)
Received call from Ulice Dash (PT) with [facility] needing verbal orders for HHPT  1 Wk 1, 3 Wk 1 and 2 Wk 1. The number to contact Ulice Dash is [phone]

## 2019-08-07 NOTE — Telephone Encounter (Signed)
Left message ok verbal orders.

## 2019-08-08 ENCOUNTER — Other Ambulatory Visit: Payer: Self-pay | Admitting: Orthopaedic Surgery

## 2019-08-08 MED ORDER — HYDROCODONE-ACETAMINOPHEN 5-325 MG PO TABS: 1.0000 | ORAL_TABLET | Freq: Four times a day (QID) | ORAL | 0 refills | Status: DC | PRN

## 2019-08-08 NOTE — Anesthesia Postprocedure Evaluation (Signed)
Anesthesia Post Note  Patient: Sarah Mullen  Procedure(s) Performed: RIGHT TOTAL HIP ARTHROPLASTY ANTERIOR APPROACH (Right Hip)     Patient location during evaluation: PACU Anesthesia Type: Spinal Level of consciousness: oriented and awake and alert Pain management: pain level controlled Vital Signs Assessment: post-procedure vital signs reviewed and stable Respiratory status: spontaneous breathing, respiratory function stable and patient connected to nasal cannula oxygen Cardiovascular status: blood pressure returned to baseline and stable Postop Assessment: no headache, no backache and no apparent nausea or vomiting Anesthetic complications: no    Last Vitals:  Vitals:   08/06/19 0900 08/06/19 1241  BP: 120/75 111/72  Pulse: (!) 103 (!) 118  Resp: 18 19  Temp: 36.5 C 37 C  SpO2: 91% (!) 85%    Last Pain:  Vitals:   08/06/19 1620  TempSrc:   PainSc: 4                  Felder Lebeda S

## 2019-08-13 ENCOUNTER — Encounter: Payer: Self-pay | Admitting: Orthopaedic Surgery

## 2019-08-13 ENCOUNTER — Other Ambulatory Visit: Payer: Self-pay | Admitting: Orthopaedic Surgery

## 2019-08-13 MED ORDER — HYDROCODONE-ACETAMINOPHEN 5-325 MG PO TABS: 1.0000 | ORAL_TABLET | Freq: Four times a day (QID) | ORAL | 0 refills | Status: DC | PRN

## 2019-08-13 NOTE — Telephone Encounter (Signed)
Please advise 

## 2019-08-13 NOTE — Telephone Encounter (Signed)
CB pt 

## 2019-08-20 ENCOUNTER — Other Ambulatory Visit: Payer: Self-pay

## 2019-08-20 ENCOUNTER — Encounter: Payer: Self-pay | Admitting: Orthopaedic Surgery

## 2019-08-20 ENCOUNTER — Ambulatory Visit (INDEPENDENT_AMBULATORY_CARE_PROVIDER_SITE_OTHER): Payer: No Typology Code available for payment source | Admitting: Orthopaedic Surgery

## 2019-08-20 DIAGNOSIS — Z96641 Presence of right artificial hip joint: Secondary | ICD-10-CM

## 2019-08-20 MED ORDER — HYDROCODONE-ACETAMINOPHEN 5-325 MG PO TABS: 1.0000 | ORAL_TABLET | Freq: Four times a day (QID) | ORAL | 0 refills | Status: DC | PRN

## 2019-08-20 NOTE — Progress Notes (Signed)
The patient comes in for follow-up 2 weeks status post a right total hip arthroplasty.  She is doing well overall.  She has been on a baby aspirin twice a day.  She was on it once a day before surgery.  I told her to go down to just once a day.  On exam her incision looks good.  Remove the staples in place Steri-Strips.  She does have a moderate seroma and I did drain about 80 cc of fluid off of the hip.  There is no evidence of infection.  Her leg lengths are equal.  She is ambulate with a cane.  Overall thus far she is satisfied with how she is doing.  She does need a refill of hydrocodone.  We will see her back in 4 weeks to see how she is doing overall but no x-rays needed.  I encouraged her to call next week to see [place] if she feels like the seroma has reaccumulated and she needs it drained off again.  All question concerns were otherwise answered and addressed.

## 2019-08-24 ENCOUNTER — Encounter: Payer: Self-pay | Admitting: Orthopaedic Surgery

## 2019-08-25 ENCOUNTER — Other Ambulatory Visit: Payer: Self-pay | Admitting: Orthopaedic Surgery

## 2019-08-25 MED ORDER — HYDROCODONE-ACETAMINOPHEN 5-325 MG PO TABS: 1.0000 | ORAL_TABLET | Freq: Four times a day (QID) | ORAL | 0 refills | Status: DC | PRN

## 2019-08-27 ENCOUNTER — Encounter: Payer: Self-pay | Admitting: Orthopaedic Surgery

## 2019-08-28 ENCOUNTER — Other Ambulatory Visit: Payer: Self-pay

## 2019-08-28 ENCOUNTER — Encounter: Payer: Self-pay | Admitting: Physician Assistant

## 2019-08-28 ENCOUNTER — Ambulatory Visit (INDEPENDENT_AMBULATORY_CARE_PROVIDER_SITE_OTHER): Payer: No Typology Code available for payment source | Admitting: Physician Assistant

## 2019-08-28 DIAGNOSIS — Z96641 Presence of right artificial hip joint: Secondary | ICD-10-CM

## 2019-08-28 MED ORDER — HYDROCODONE-ACETAMINOPHEN 5-325 MG PO TABS: 1.0000 | ORAL_TABLET | Freq: Four times a day (QID) | ORAL | 0 refills | Status: DC | PRN

## 2019-08-28 MED ORDER — METHOCARBAMOL 500 MG PO TABS: 500.0000 mg | ORAL_TABLET | Freq: Four times a day (QID) | ORAL | 1 refills | Status: DC | PRN

## 2019-08-28 NOTE — Progress Notes (Signed)
HPI: Sarah Mullen returns today due to swelling around the right total hip.  She states the hip feels tight.  She cannot sleep is having pulling sensation.  Asking for a refill on her Vicodin and Robaxin.  Physical exam: Right hip positive seroma.  No signs of infection.  Surgical incisions healing well.  No signs of dehiscence.  Right hip is prepped with Betadine and ethyl chloride 80 cc of aspirate is pain patient tolerates well.  Impression: Status post right total hip arthroplasty 08/05/2019  Plan: She will follow-up with [place] in about 3 weeks sooner if there is any questions concerns.  Continue to apply heat to the area.  Refill of her Norco and Robaxin was given.  Questions encouraged and answered scar tissue mobilization encouraged.

## 2019-09-02 ENCOUNTER — Encounter: Payer: Self-pay | Admitting: Orthopaedic Surgery

## 2019-09-03 ENCOUNTER — Ambulatory Visit (INDEPENDENT_AMBULATORY_CARE_PROVIDER_SITE_OTHER): Payer: No Typology Code available for payment source | Admitting: Physician Assistant

## 2019-09-03 ENCOUNTER — Other Ambulatory Visit: Payer: Self-pay

## 2019-09-03 ENCOUNTER — Encounter: Payer: Self-pay | Admitting: Physician Assistant

## 2019-09-03 DIAGNOSIS — Z96641 Presence of right artificial hip joint: Secondary | ICD-10-CM

## 2019-09-03 NOTE — Progress Notes (Signed)
HPI: Sarah Mullen returns today wanting repeat aspiration of the right hip.  She is overall doing well.  She has some discomfort at night.  She is ambulating with a cane.  Still complaining right knee pain.  Physical exam: Right hip surgical incision is healing well no signs of infection.  Positive seroma.  Seroma was aspirated today 25 cc of serosanguineous fluid obtained patient tolerates well.  Impression: Status post right total hip arthroplasty 08/05/2019  Plan: She will continue work on scar tissue mobilization, range of motion of the hip gait and balance.  Follow-up with [place] in 2 weeks for sooner if she needs a repeat aspiration.

## 2019-09-10 ENCOUNTER — Other Ambulatory Visit: Payer: Self-pay

## 2019-09-10 ENCOUNTER — Other Ambulatory Visit: Payer: Self-pay | Admitting: Physician Assistant

## 2019-09-10 MED ORDER — HYDROCODONE-ACETAMINOPHEN 5-325 MG PO TABS: 1.0000 | ORAL_TABLET | Freq: Four times a day (QID) | ORAL | 0 refills | Status: DC | PRN

## 2019-09-10 MED FILL — HYDROCODON-APAP 5-325: 5-325 | 4 days supply | Qty: 30 | Fill #0

## 2019-09-10 NOTE — Telephone Encounter (Signed)
GC

## 2019-09-14 ENCOUNTER — Encounter: Payer: Self-pay | Admitting: Internal Medicine

## 2019-09-15 MED ORDER — GABAPENTIN 100 MG PO CAPS: 200.0000 mg | ORAL_CAPSULE | Freq: Every day | ORAL | 1 refills | Status: DC

## 2019-09-15 MED FILL — GABAPENTIN 100 MG CAPSULE: 100 | 90 days supply | Qty: 180 | Fill #0

## 2019-09-17 ENCOUNTER — Other Ambulatory Visit: Payer: Self-pay

## 2019-09-17 ENCOUNTER — Encounter: Payer: Self-pay | Admitting: Orthopaedic Surgery

## 2019-09-17 ENCOUNTER — Ambulatory Visit (INDEPENDENT_AMBULATORY_CARE_PROVIDER_SITE_OTHER): Payer: No Typology Code available for payment source | Admitting: Orthopaedic Surgery

## 2019-09-17 DIAGNOSIS — Z96641 Presence of right artificial hip joint: Secondary | ICD-10-CM

## 2019-09-17 NOTE — Progress Notes (Signed)
Sarah Mullen is 6 weeks status post a right total hip arthroplasty.  She is doing better on a daily basis.  Her gait is improving.  She still has had pain when she sits for long period time and gets up.  She first limps when she does get up.  She still feels like there is some fullness around her incision but overall is doing well.  On examination the incision looks good.  There is evidence of an underlying hematoma that is lessening.  There is no evidence of infection.  Her right hip moves smoothly.  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  I will give her a note to return to work starting March 9.  All questions and concerns were answered and addressed.  She cannot take oral anti-inflammatories at this standpoint.  She will increase her activities as tolerated.  We will see her back in 6 months.  If there is issues before then she will let us know.  At her 24-month follow-up visit I like a standing low AP pelvis and lateral of her right operative hip.

## 2019-09-22 ENCOUNTER — Encounter: Payer: Self-pay | Admitting: Internal Medicine

## 2019-09-22 ENCOUNTER — Other Ambulatory Visit: Payer: Self-pay | Admitting: Internal Medicine

## 2019-09-23 MED FILL — PANTOPRAZOLE SOD DR 40 MG T: 40 | 90 days supply | Qty: 90 | Fill #0

## 2019-09-23 MED FILL — DULoxetine HCL 60 MG CPEP: 60 | 90 days supply | Qty: 90 | Fill #0

## 2019-09-24 ENCOUNTER — Other Ambulatory Visit: Payer: Self-pay

## 2019-12-12 ENCOUNTER — Encounter: Payer: Self-pay | Admitting: Obstetrics & Gynecology

## 2019-12-12 ENCOUNTER — Telehealth: Payer: Self-pay | Admitting: Obstetrics & Gynecology

## 2019-12-12 DIAGNOSIS — R6882 Decreased libido: Secondary | ICD-10-CM

## 2019-12-12 NOTE — Telephone Encounter (Signed)
Left message for pt to return call to triage RN. 

## 2019-12-12 NOTE — Telephone Encounter (Signed)
AEX 03/2019, next scheduled 05/2020  Spoke with pt. Pt states needing testosterone level due to needing refill on estradiol cream 0.2% with testosterone cream at [facility].   Reviewed Dr Ammie Ferrier note per AEX on 03/2019: Total testosterone ordered for one month once she restarts the topical testosterone. Pt states has not had any testosterone level checked after starting in 03/2019.   Advised to have testosterone level done before refill can be given. Pt states will be out of Rx on 12/15/19. Pt requesting to go to [facility] site in [place] to have drawn. Total testosterone level ordered and released. Pt given [facility] sites to have drawn. Pt agreeable and verbalized understanding.   Routing to Dr Sabra Heck for review and any additional recommendations.

## 2019-12-12 NOTE — Telephone Encounter (Signed)
Patient is returning call.  °

## 2019-12-12 NOTE — Telephone Encounter (Signed)
Non-Urgent Medical Question Received: Today Message Contents  [place], Dula Kerestes sent to [facility]  Phone Number: [phone]  I need a lab for testosterone. Can I get a lab draw at the office today or a slip so I can go to the [facility] lab so I can have it drawn today?    FYI: Patient does not have an order for this lab in epic.

## 2019-12-13 LAB — TESTOSTERONE: Testosterone: 38 ng/dL (ref 3–67)

## 2019-12-15 NOTE — Telephone Encounter (Signed)
Spoke with pt. Pt given results and Rx update per Dr Sabra Heck. Pt agreeable and verbalized understanding.  Encounter closed.

## 2019-12-15 NOTE — Telephone Encounter (Signed)
Please let pt know her testosterone level was 38.  This is good.  Rx will be faxed to [facility] today.

## 2019-12-18 ENCOUNTER — Other Ambulatory Visit: Payer: Self-pay | Admitting: Obstetrics & Gynecology

## 2019-12-18 MED ORDER — NONFORMULARY OR COMPOUNDED ITEM: 1 refills | Status: DC

## 2019-12-19 ENCOUNTER — Telehealth: Payer: Self-pay | Admitting: Obstetrics & Gynecology

## 2019-12-19 NOTE — Telephone Encounter (Signed)
Spoke with patient. Advised that Dr. Sabra Heck has contacted the pharmacy personally to clarify prescription. Pharmacy should notify her when RX is available. Patient thankful for call.   Routing to provider for final review. Patient is agreeable to disposition. Will close encounter.

## 2019-12-19 NOTE — Telephone Encounter (Signed)
Patient spoke with [facility] and her prescription had been changed from Estradiol cream 0.02% to 0.2%  with testosterone 1%. [facility] asked for our office to confirm that the Estradiol had changed to 0.2%. She asked if someone could let her know when our office contacted the pharmacy.

## 2020-01-09 MED FILL — PANTOPRAZOLE SOD DR 40 MG T: 40 | 90 days supply | Qty: 90 | Fill #1

## 2020-01-09 MED FILL — DULoxetine HCL 60 MG CPEP: 60 | 90 days supply | Qty: 90 | Fill #1

## 2020-01-09 MED FILL — GABAPENTIN 100 MG CAPSULE: 100 | 90 days supply | Qty: 180 | Fill #1

## 2020-02-27 ENCOUNTER — Encounter: Payer: Self-pay | Admitting: Internal Medicine

## 2020-03-14 ENCOUNTER — Encounter: Payer: Self-pay | Admitting: Orthopaedic Surgery

## 2020-03-16 ENCOUNTER — Ambulatory Visit: Payer: No Typology Code available for payment source | Admitting: Orthopaedic Surgery

## 2020-03-24 ENCOUNTER — Ambulatory Visit (INDEPENDENT_AMBULATORY_CARE_PROVIDER_SITE_OTHER): Payer: No Typology Code available for payment source

## 2020-03-24 ENCOUNTER — Ambulatory Visit (INDEPENDENT_AMBULATORY_CARE_PROVIDER_SITE_OTHER): Payer: No Typology Code available for payment source | Admitting: Orthopaedic Surgery

## 2020-03-24 ENCOUNTER — Other Ambulatory Visit: Payer: Self-pay

## 2020-03-24 ENCOUNTER — Encounter: Payer: Self-pay | Admitting: Orthopaedic Surgery

## 2020-03-24 DIAGNOSIS — Z96641 Presence of right artificial hip joint: Secondary | ICD-10-CM

## 2020-03-24 NOTE — Progress Notes (Signed)
Office Visit Note   Patient: Sarah Mullen           Date of Birth: Sep 03, 1955           MRN: [id number] Visit Date: 03/24/2020              Requested by: Binnie Rail, MD [place],  [place] [id number] PCP: Binnie Rail, MD   Assessment & Plan: Visit Diagnoses:  1. Status post total replacement of right hip   2. History of right hip replacement     Plan:  She is activities as tolerated.  Reminded of them that there is no restrictions in regards to range of motion of the hip.  Questions were encouraged and answered by Dr. Ninfa Linden and myself.  She will follow-up with [place] in 4 months at her 1 year postop visit obtain an AP pelvis and lateral view of the right hip.    Follow-Up Instructions: Return in about 4 months (around 07/24/2020).   Orders:  Orders Placed This Encounter  Procedures  . XR HIP UNILAT W OR W/O PELVIS 1V RIGHT   No orders of the defined types were placed in this encounter.     Procedures: No procedures performed   Clinical Data: No additional findings.   Subjective: Chief Complaint  Patient presents with  . Right Hip - Follow-up    HPI [name] comes in today 7 months status post right total hip arthroplasty.  She is doing well she has no complaints.  She states she wants to get back into yoga and is wondering if this will be okay.  Review of Systems  Constitutional: Negative for chills and fever.     Objective: Vital Signs: LMP 09/24/2011   Physical Exam Cardiovascular:     Pulses: Normal pulses.  Neurological:     Mental Status: She is alert and oriented to person, place, and time.  Psychiatric:        Mood and Affect: Mood normal.     Ortho Exam  Right hip good range of motion without pain.  Calf supple nontender.  Dorsiflexion plantarflexion right ankle intact.  Ambulates without any assistive device and nonantalgic gait.  Specialty Comments:  No specialty comments available.  Imaging: XR HIP UNILAT W OR W/O  PELVIS 1V RIGHT  Result Date: 03/24/2020 Right lateral hip and AP pelvis: Right total hip arthroplasty components well seated.  No acute fractures.  No bony abnormalities.  Left hip overall well-preserved.  Bilateral hips well located.    PMFS History: Patient Active Problem List   Diagnosis Date Noted  . Status post total replacement of right hip 08/05/2019  . Unilateral primary osteoarthritis, right hip 06/23/2019  . Nonallopathic lesion of sacral region 06/13/2019  . Nonallopathic lesion of lumbosacral region 06/13/2019  . Nonallopathic lesion of thoracic region 06/13/2019  . Right hip pain 06/13/2019  . Left knee pain 01/17/2019  . Spinal stenosis in cervical region 03/27/2018  . Testosterone deficiency 08/22/2017  . Hyperlipidemia 08/22/2017  . Chronic lower back pain 08/22/2017  . Cervical myelopathy ([place]) 05/15/2016  . Vitamin D deficiency 05/15/2016  . Prediabetes 05/14/2016  . Fatty liver 03/09/2016  . Positive TB test 05/28/2012  . GERD (gastroesophageal reflux disease) 11/21/2010  . Dyshidrotic eczema 11/21/2010   Past Medical History:  Diagnosis Date  . Allergic rhinitis, cause unspecified   . Blood transfusion    iron transfusion- [place]-2009  . Chronic headaches   . Depression   .  Diverticulitis   . GERD (gastroesophageal reflux disease)   . Heart murmur    diag with echo-physiologic murmur; trace MR, trivial, early systolic MR by 67/34/19 echo ([facility])  . History of sinus tachycardia    07/04/07 Holter monitor: 69-154, average HR 95 bpm. Occ PACs, 4 runs probable atrial tachycardia  . Menometrorrhagia 11/21/2010  . Migraines   . Pre-diabetes     Family History  Problem Relation Age of Onset  . Arthritis Mother   . Hyperlipidemia Mother   . Heart disease Mother   . Arthritis Father   . Lung cancer Father        smoker  . Heart disease Father   . Hypertension Father   . Diabetes Father   . Colon cancer Paternal Aunt   . CAD  Paternal Grandmother   . Kidney disease Maternal Grandfather        ESRD on HD  . Kidney disease Maternal Grandmother        ESRD on HD  . Diabetes Brother   . Metabolic syndrome Sister   . Stomach cancer Neg Hx   . Rectal cancer Neg Hx   . Esophageal cancer Neg Hx     Past Surgical History:  Procedure Laterality Date  . ABDOMINAL HYSTERECTOMY  09/2011  . ANTERIOR CERVICAL DECOMP/DISCECTOMY FUSION N/A 03/27/2018   Procedure: Anteroir Cervical Decrompression Fusion - Cervical Three - Cervical Four - Cervical Four - Cervical Five - Cervical Five - Cervical Six;  Surgeon: Kary Kos, MD;  Location: [facility];  Service: Neurosurgery;  Laterality: N/A;  Anteroir Cervical Decrompression Fusion - Cervical Three - Cervical Four - Cervical Four - Cervical Five - Cervical Five - Cervical Six  . BREAST BIOPSY     right breast/benign  . CERCLAGE REMOVAL  1986   WHEN pregnant  . [place]   1 time  . COLONOSCOPY    . CYSTOSCOPY  10/17/2011   Procedure: CYSTOSCOPY;  Surgeon: Megan Salon, MD;  Location: [facility];  Service: Gynecology;;  . DILATION AND CURETTAGE OF UTERUS    . ESOPHAGOGASTRODUODENOSCOPY    . TONSILLECTOMY    . TOTAL HIP ARTHROPLASTY Right 08/05/2019   Procedure: RIGHT TOTAL HIP ARTHROPLASTY ANTERIOR APPROACH;  Surgeon: Mcarthur Rossetti, MD;  Location: [facility];  Service: Orthopedics;  Laterality: Right;   Social History   Occupational History  . Not on file  Tobacco Use  . Smoking status: Former Smoker    Types: Cigarettes    Quit date: 10/09/2001    Years since quitting: 18.4  . Smokeless tobacco: Never Used  Vaping Use  . Vaping Use: Never used  Substance and Sexual Activity  . Alcohol use: No    Alcohol/week: 0.0 standard drinks  . Drug use: No  . Sexual activity: Yes    Partners: Male    Birth control/protection: Surgical    Comment: TLH/BSO

## 2020-04-06 ENCOUNTER — Encounter: Payer: No Typology Code available for payment source | Admitting: Internal Medicine

## 2020-04-08 ENCOUNTER — Encounter: Payer: Self-pay | Admitting: Internal Medicine

## 2020-04-08 MED ORDER — PANTOPRAZOLE SODIUM 40 MG PO TBEC: 40.0000 mg | DELAYED_RELEASE_TABLET | Freq: Every day | ORAL | 0 refills | Status: DC

## 2020-04-08 MED ORDER — DULOXETINE HCL 60 MG PO CPEP: 60.0000 mg | ORAL_CAPSULE | Freq: Every day | ORAL | 0 refills | Status: DC

## 2020-04-08 MED FILL — PANTOPRAZOLE SOD DR 40 MG T: 40 | 30 days supply | Qty: 30 | Fill #0

## 2020-04-08 MED FILL — DULoxetine HCL 60 MG CPEP: 60 | 30 days supply | Qty: 30 | Fill #0

## 2020-04-13 NOTE — Progress Notes (Signed)
Subjective:    Patient ID: Sarah Mullen, female    DOB: 1955/10/08, 64 y.o.   MRN: [id number]  HPI She is here for a physical exam.   She had her hip replaced when she was here last.  Her hip is doing well.  She has gained weight and knows she needs to focus on weight loss.  Medications and allergies reviewed with patient and updated if appropriate.  Patient Active Problem List   Diagnosis Date Noted  . Status post total replacement of right hip 08/05/2019  . Unilateral primary osteoarthritis, right hip 06/23/2019  . Nonallopathic lesion of sacral region 06/13/2019  . Nonallopathic lesion of lumbosacral region 06/13/2019  . Nonallopathic lesion of thoracic region 06/13/2019  . Right hip pain 06/13/2019  . Left knee pain 01/17/2019  . Spinal stenosis in cervical region 03/27/2018  . Testosterone deficiency 08/22/2017  . Hyperlipidemia 08/22/2017  . Chronic lower back pain 08/22/2017  . Cervical myelopathy ([place]) 05/15/2016  . Vitamin D deficiency 05/15/2016  . Prediabetes 05/14/2016  . Fatty liver 03/09/2016  . Positive TB test 05/28/2012  . GERD (gastroesophageal reflux disease) 11/21/2010  . Dyshidrotic eczema 11/21/2010    Current Outpatient Medications on File Prior to Visit  Medication Sig Dispense Refill  . aspirin 81 MG chewable tablet Chew 1 tablet (81 mg total) by mouth 2 (two) times daily. 30 tablet 0  . aspirin-acetaminophen-caffeine (EXCEDRIN MIGRAINE) 250-250-65 MG tablet Take 2 tablets by mouth daily as needed for headache or migraine.    . cetirizine (ZYRTEC) 10 MG tablet Take 10 mg by mouth daily.    . DULoxetine (CYMBALTA) 60 MG capsule Take 1 capsule (60 mg total) by mouth daily. Must keep scheduled appt for future refills 30 capsule 0  . NONFORMULARY OR COMPOUNDED ITEM Estradiol cream 0.2% with testosterone 1% cream, Apply 1/2 ml topically every morning as directed.  #45 ml grams 1 each 1  . pantoprazole (PROTONIX) 40 MG tablet Take 1 tablet (40 mg total)  by mouth daily. Must keep scheduled appt for future refills 30 tablet 0  . [DISCONTINUED] progesterone (PROMETRIUM) 200 MG capsule Take 200 mg by mouth daily.       No current facility-administered medications on file prior to visit.    Past Medical History:  Diagnosis Date  . Allergic rhinitis, cause unspecified   . Blood transfusion    iron transfusion- [place]-2009  . Chronic headaches   . Depression   . Diverticulitis   . GERD (gastroesophageal reflux disease)   . Heart murmur    diag with echo-physiologic murmur; trace MR, trivial, early systolic MR by 62/69/48 echo ([facility])  . History of sinus tachycardia    07/04/07 Holter monitor: 69-154, average HR 95 bpm. Occ PACs, 4 runs probable atrial tachycardia  . Menometrorrhagia 11/21/2010  . Migraines   . Pre-diabetes     Past Surgical History:  Procedure Laterality Date  . ABDOMINAL HYSTERECTOMY  09/2011  . ANTERIOR CERVICAL DECOMP/DISCECTOMY FUSION N/A 03/27/2018   Procedure: Anteroir Cervical Decrompression Fusion - Cervical Three - Cervical Four - Cervical Four - Cervical Five - Cervical Five - Cervical Six;  Surgeon: Kary Kos, MD;  Location: [facility];  Service: Neurosurgery;  Laterality: N/A;  Anteroir Cervical Decrompression Fusion - Cervical Three - Cervical Four - Cervical Four - Cervical Five - Cervical Five - Cervical Six  . BREAST BIOPSY     right breast/benign  . [place]  pregnant  . [place]   1 time  . COLONOSCOPY    . CYSTOSCOPY  10/17/2011   Procedure: CYSTOSCOPY;  Surgeon: Megan Salon, MD;  Location: [facility];  Service: Gynecology;;  . DILATION AND CURETTAGE OF UTERUS    . ESOPHAGOGASTRODUODENOSCOPY    . TONSILLECTOMY    . TOTAL HIP ARTHROPLASTY Right 08/05/2019   Procedure: RIGHT TOTAL HIP ARTHROPLASTY ANTERIOR APPROACH;  Surgeon: Mcarthur Rossetti, MD;  Location: [facility];  Service: Orthopedics;  Laterality: Right;    Social History   Socioeconomic  History  . Marital status: Single    Spouse name: Not on file  . Number of children: Not on file  . Years of education: Not on file  . Highest education level: Not on file  Occupational History  . Not on file  Tobacco Use  . Smoking status: Former Smoker    Types: Cigarettes    Quit date: 10/09/2001    Years since quitting: 18.5  . Smokeless tobacco: Never Used  Vaping Use  . Vaping Use: Never used  Substance and Sexual Activity  . Alcohol use: No    Alcohol/week: 0.0 standard drinks  . Drug use: No  . Sexual activity: Yes    Partners: Male    Birth control/protection: Surgical    Comment: TLH/BSO  Other Topics Concern  . Not on file  Social History Narrative  . Not on file   Social Determinants of Health   Financial Resource Strain:   . Difficulty of Paying Living Expenses: Not on file  Food Insecurity:   . Worried About Charity fundraiser in the Last Year: Not on file  . Ran Out of Food in the Last Year: Not on file  Transportation Needs:   . Lack of Transportation (Medical): Not on file  . Lack of Transportation (Non-Medical): Not on file  Physical Activity:   . Days of Exercise per Week: Not on file  . Minutes of Exercise per Session: Not on file  Stress:   . Feeling of Stress : Not on file  Social Connections:   . Frequency of Communication with Friends and Family: Not on file  . Frequency of Social Gatherings with Friends and Family: Not on file  . Attends Religious Services: Not on file  . Active Member of Clubs or Organizations: Not on file  . Attends Archivist Meetings: Not on file  . Marital Status: Not on file    Family History  Problem Relation Age of Onset  . Arthritis Mother   . Hyperlipidemia Mother   . Heart disease Mother   . Arthritis Father   . Lung cancer Father        smoker  . Heart disease Father   . Hypertension Father   . Diabetes Father   . Colon cancer Paternal Aunt   . CAD Paternal Grandmother   . Kidney disease  Maternal Grandfather        ESRD on HD  . Kidney disease Maternal Grandmother        ESRD on HD  . Diabetes Brother   . Metabolic syndrome Sister   . Stomach cancer Neg Hx   . Rectal cancer Neg Hx   . Esophageal cancer Neg Hx     Review of Systems  Constitutional: Negative for chills and fever.  Eyes: Negative for visual disturbance.  Respiratory: Positive for shortness of breath (with exertion). Negative for cough and wheezing.   Cardiovascular: Negative  for chest pain, palpitations and leg swelling.  Gastrointestinal: Negative for abdominal pain, blood in stool, constipation, diarrhea and nausea.       GERD controlled  Genitourinary: Negative for dysuria and hematuria.  Musculoskeletal: Positive for back pain (chronic) and neck pain (occ from work). Negative for arthralgias.  Skin:       Lesion medial left ankle  Neurological: Positive for light-headedness (occ) and headaches (occ). Negative for dizziness.  Psychiatric/Behavioral: Negative for dysphoric mood. The patient is not nervous/anxious.        Objective:   Vitals:   04/14/20 1007  BP: 128/80  Pulse: 95  Temp: 98.7 F (37.1 C)  SpO2: 97%   Filed Weights   04/14/20 1007  Weight: 173 lb 3.2 oz (78.6 kg)   Body mass index is 32.73 kg/m.  BP Readings from Last 3 Encounters:  04/14/20 128/80  08/06/19 111/72  08/01/19 135/72    Wt Readings from Last 3 Encounters:  04/14/20 173 lb 3.2 oz (78.6 kg)  08/05/19 169 lb 5 oz (76.8 kg)  08/01/19 169 lb 5 oz (76.8 kg)     Physical Exam Constitutional: She appears well-developed and well-nourished. No distress.  HENT:  Head: Normocephalic and atraumatic.  Right Ear: External ear normal. Normal ear canal and TM Left Ear: External ear normal.  Normal ear canal and TM Mouth/Throat: Oropharynx is clear and moist.  Eyes: Conjunctivae and EOM are normal.  Neck: Neck supple. No tracheal deviation present. No thyromegaly present.  No carotid bruit  Cardiovascular:  Normal rate, regular rhythm and normal heart sounds.   No murmur heard.  No edema. Pulmonary/Chest: Effort normal and breath sounds normal. No respiratory distress. She has no wheezes. She has no rales.  Breast: deferred   Abdominal: Soft. She exhibits no distension. There is no tenderness.  Lymphadenopathy: She has no cervical adenopathy.  Skin: Skin is warm and dry. She is not diaphoretic.  Psychiatric: She has a normal mood and affect. Her behavior is normal.        Assessment & Plan:   Physical exam: Screening blood work    ordered Immunizations  Flu vaccine at work,  had Covid, discussed shingrix Colonoscopy  Up to date  Mammogram  Due - will get this month Gyn  Dr Sabra Heck - up to date Dexa   Due - ordered Eye exams  Up to date  Exercise  none Weight  Plans on working on weight loss - will join noon again Substance abuse  none  See Problem List for Assessment and Plan of chronic medical problems.   This visit occurred during the SARS-CoV-2 public health emergency.  Safety protocols were in place, including screening questions prior to the visit, additional usage of staff PPE, and extensive cleaning of exam room while observing appropriate contact time as indicated for disinfecting solutions.

## 2020-04-13 NOTE — Patient Instructions (Addendum)
Blood work was ordered.    All other Health Maintenance issues reviewed.   All recommended immunizations and age-appropriate screenings are up-to-date or discussed.  No immunization administered today.   Medications reviewed and updated.  Changes include :   none     Please followup in 1 year    Health Maintenance, Female Adopting a healthy lifestyle and getting preventive care are important in promoting health and wellness. Ask your health care provider about:  The right schedule for you to have regular tests and exams.  Things you can do on your own to prevent diseases and keep yourself healthy. What should I know about diet, weight, and exercise? Eat a healthy diet   Eat a diet that includes plenty of vegetables, fruits, low-fat dairy products, and lean protein.  Do not eat a lot of foods that are high in solid fats, added sugars, or sodium. Maintain a healthy weight Body mass index (BMI) is used to identify weight problems. It estimates body fat based on height and weight. Your health care provider can help determine your BMI and help you achieve or maintain a healthy weight. Get regular exercise Get regular exercise. This is one of the most important things you can do for your health. Most adults should:  Exercise for at least 150 minutes each week. The exercise should increase your heart rate and make you sweat (moderate-intensity exercise).  Do strengthening exercises at least twice a week. This is in addition to the moderate-intensity exercise.  Spend less time sitting. Even light physical activity can be beneficial. Watch cholesterol and blood lipids Have your blood tested for lipids and cholesterol at 65 years of age, then have this test every 5 years. Have your cholesterol levels checked more often if:  Your lipid or cholesterol levels are high.  You are older than 65 years of age.  You are at high risk for heart disease. What should I know about cancer  screening? Depending on your health history and family history, you may need to have cancer screening at various ages. This may include screening for:  Breast cancer.  Cervical cancer.  Colorectal cancer.  Skin cancer.  Lung cancer. What should I know about heart disease, diabetes, and high blood pressure? Blood pressure and heart disease  High blood pressure causes heart disease and increases the risk of stroke. This is more likely to develop in people who have high blood pressure readings, are of African descent, or are overweight.  Have your blood pressure checked: ? Every 3-5 years if you are 18-39 years of age. ? Every year if you are 40 years old or older. Diabetes Have regular diabetes screenings. This checks your fasting blood sugar level. Have the screening done:  Once every three years after age 40 if you are at a normal weight and have a low risk for diabetes.  More often and at a younger age if you are overweight or have a high risk for diabetes. What should I know about preventing infection? Hepatitis B If you have a higher risk for hepatitis B, you should be screened for this virus. Talk with your health care provider to find out if you are at risk for hepatitis B infection. Hepatitis C Testing is recommended for:  Everyone born from 1945 through 1965.  Anyone with known risk factors for hepatitis C. Sexually transmitted infections (STIs)  Get screened for STIs, including gonorrhea and chlamydia, if: ? You are sexually active and are younger than 65 years   of age. ? You are older than 65 years of age and your health care provider tells you that you are at risk for this type of infection. ? Your sexual activity has changed since you were last screened, and you are at increased risk for chlamydia or gonorrhea. Ask your health care provider if you are at risk.  Ask your health care provider about whether you are at high risk for HIV. Your health care provider may  recommend a prescription medicine to help prevent HIV infection. If you choose to take medicine to prevent HIV, you should first get tested for HIV. You should then be tested every 3 months for as long as you are taking the medicine. Pregnancy  If you are about to stop having your period (premenopausal) and you may become pregnant, seek counseling before you get pregnant.  Take 400 to 800 micrograms (mcg) of folic acid every day if you become pregnant.  Ask for birth control (contraception) if you want to prevent pregnancy. Osteoporosis and menopause Osteoporosis is a disease in which the bones lose minerals and strength with aging. This can result in bone fractures. If you are 65 years old or older, or if you are at risk for osteoporosis and fractures, ask your health care provider if you should:  Be screened for bone loss.  Take a calcium or vitamin D supplement to lower your risk of fractures.  Be given hormone replacement therapy (HRT) to treat symptoms of menopause. Follow these instructions at home: Lifestyle  Do not use any products that contain nicotine or tobacco, such as cigarettes, e-cigarettes, and chewing tobacco. If you need help quitting, ask your health care provider.  Do not use street drugs.  Do not share needles.  Ask your health care provider for help if you need support or information about quitting drugs. Alcohol use  Do not drink alcohol if: ? Your health care provider tells you not to drink. ? You are pregnant, may be pregnant, or are planning to become pregnant.  If you drink alcohol: ? Limit how much you use to 0-1 drink a day. ? Limit intake if you are breastfeeding.  Be aware of how much alcohol is in your drink. In the [place] one drink equals one 12 oz bottle of beer (355 mL), one 5 oz glass of wine (148 mL), or one 1 oz glass of hard liquor (44 mL). General instructions  Schedule regular health, dental, and eye exams.  Stay current with your  vaccines.  Tell your health care provider if: ? You often feel depressed. ? You have ever been abused or do not feel safe at home. Summary  Adopting a healthy lifestyle and getting preventive care are important in promoting health and wellness.  Follow your health care provider's instructions about healthy diet, exercising, and getting tested or screened for diseases.  Follow your health care provider's instructions on monitoring your cholesterol and blood pressure. This information is not intended to replace advice given to you by your health care provider. Make sure you discuss any questions you have with your health care provider. Document Revised: 07/03/2018 Document Reviewed: 07/03/2018 Elsevier Patient Education  2020 Elsevier Inc.  

## 2020-04-14 ENCOUNTER — Encounter: Payer: Self-pay | Admitting: Internal Medicine

## 2020-04-14 ENCOUNTER — Other Ambulatory Visit: Payer: Self-pay

## 2020-04-14 ENCOUNTER — Ambulatory Visit (INDEPENDENT_AMBULATORY_CARE_PROVIDER_SITE_OTHER): Payer: No Typology Code available for payment source | Admitting: Internal Medicine

## 2020-04-14 VITALS — BP 128/80 | HR 95 | Temp 98.7°F | Ht 61.0 in | Wt 173.2 lb

## 2020-04-14 DIAGNOSIS — R7303 Prediabetes: Secondary | ICD-10-CM | POA: Diagnosis not present

## 2020-04-14 DIAGNOSIS — M545 Low back pain, unspecified: Secondary | ICD-10-CM

## 2020-04-14 DIAGNOSIS — G8929 Other chronic pain: Secondary | ICD-10-CM

## 2020-04-14 DIAGNOSIS — E559 Vitamin D deficiency, unspecified: Secondary | ICD-10-CM

## 2020-04-14 DIAGNOSIS — Z0000 Encounter for general adult medical examination without abnormal findings: Secondary | ICD-10-CM

## 2020-04-14 DIAGNOSIS — Z13820 Encounter for screening for osteoporosis: Secondary | ICD-10-CM

## 2020-04-14 DIAGNOSIS — K219 Gastro-esophageal reflux disease without esophagitis: Secondary | ICD-10-CM | POA: Diagnosis not present

## 2020-04-14 NOTE — Assessment & Plan Note (Signed)
Chronic Taking ibuprofen 800  Mg at night and cymbalta daily Above medication controlled Continue above

## 2020-04-14 NOTE — Assessment & Plan Note (Signed)
Chronic Check a1c Low sugar / carb diet Stressed regular exercise  

## 2020-04-14 NOTE — Assessment & Plan Note (Addendum)
Chronic Not taking vitamin d Will check level If vitamin D level is low we will do high-dose for 2 months.  Advised 2000 units daily

## 2020-04-14 NOTE — Assessment & Plan Note (Signed)
Chronic GERD controlled Continue daily medication protonix

## 2020-04-15 LAB — CBC WITH DIFFERENTIAL/PLATELET
Absolute Monocytes: 480 cells/uL (ref 200–950)
Basophils Absolute: 53 cells/uL (ref 0–200)
Basophils Relative: 0.7 %
Eosinophils Absolute: 413 cells/uL (ref 15–500)
Eosinophils Relative: 5.5 %
HCT: 38.3 % (ref 35.0–45.0)
Hemoglobin: 12 g/dL (ref 11.7–15.5)
Lymphs Abs: 2355 cells/uL (ref 850–3900)
MCH: 25.7 pg — ABNORMAL LOW (ref 27.0–33.0)
MCHC: 31.3 g/dL — ABNORMAL LOW (ref 32.0–36.0)
MCV: 82 fL (ref 80.0–100.0)
MPV: 10.3 fL (ref 7.5–12.5)
Monocytes Relative: 6.4 %
Neutro Abs: 4200 cells/uL (ref 1500–7800)
Neutrophils Relative %: 56 %
Platelets: 352 10*3/uL (ref 140–400)
RBC: 4.67 10*6/uL (ref 3.80–5.10)
RDW: 14.6 % (ref 11.0–15.0)
Total Lymphocyte: 31.4 %
WBC: 7.5 10*3/uL (ref 3.8–10.8)

## 2020-04-15 LAB — COMPREHENSIVE METABOLIC PANEL
AG Ratio: 1.8 (calc) (ref 1.0–2.5)
ALT: 31 U/L — ABNORMAL HIGH (ref 6–29)
AST: 23 U/L (ref 10–35)
Albumin: 4.3 g/dL (ref 3.6–5.1)
Alkaline phosphatase (APISO): 111 U/L (ref 37–153)
BUN: 20 mg/dL (ref 7–25)
CO2: 29 mmol/L (ref 20–32)
Calcium: 10 mg/dL (ref 8.6–10.4)
Chloride: 105 mmol/L (ref 98–110)
Creat: 0.67 mg/dL (ref 0.50–0.99)
Globulin: 2.4 g/dL (calc) (ref 1.9–3.7)
Glucose, Bld: 96 mg/dL (ref 65–99)
Potassium: 4.9 mmol/L (ref 3.5–5.3)
Sodium: 141 mmol/L (ref 135–146)
Total Bilirubin: 0.3 mg/dL (ref 0.2–1.2)
Total Protein: 6.7 g/dL (ref 6.1–8.1)

## 2020-04-15 LAB — HEMOGLOBIN A1C
Hgb A1c MFr Bld: 6.5 % of total Hgb — ABNORMAL HIGH (ref <5.7)
Mean Plasma Glucose: 140 (calc)
eAG (mmol/L): 7.7 (calc)

## 2020-04-15 LAB — LIPID PANEL
Cholesterol: 181 mg/dL (ref <200)
HDL: 60 mg/dL (ref >=50)
LDL Cholesterol (Calc): 104 mg/dL (calc) — ABNORMAL HIGH
Non-HDL Cholesterol (Calc): 121 mg/dL (calc) (ref <130)
Total CHOL/HDL Ratio: 3 (calc) (ref <5.0)
Triglycerides: 81 mg/dL (ref <150)

## 2020-04-15 LAB — TSH: TSH: 2.3 mIU/L (ref 0.40–4.50)

## 2020-04-15 LAB — VITAMIN D 25 HYDROXY (VIT D DEFICIENCY, FRACTURES): Vit D, 25-Hydroxy: 21 ng/mL — ABNORMAL LOW (ref 30–100)

## 2020-05-12 ENCOUNTER — Encounter: Payer: Self-pay | Admitting: Internal Medicine

## 2020-05-12 ENCOUNTER — Other Ambulatory Visit: Payer: Self-pay

## 2020-05-12 MED ORDER — DULOXETINE HCL 60 MG PO CPEP: 60.0000 mg | ORAL_CAPSULE | Freq: Every day | ORAL | 1 refills | Status: DC

## 2020-05-12 MED ORDER — PANTOPRAZOLE SODIUM 40 MG PO TBEC: 40.0000 mg | DELAYED_RELEASE_TABLET | Freq: Every day | ORAL | 2 refills | Status: DC

## 2020-05-12 MED FILL — PANTOPRAZOLE SOD DR 40 MG T: 40 | 90 days supply | Qty: 90 | Fill #0

## 2020-05-13 MED FILL — DULoxetine HCL 60 MG CPEP: 60 | 90 days supply | Qty: 90 | Fill #0

## 2020-06-10 ENCOUNTER — Ambulatory Visit: Payer: No Typology Code available for payment source | Admitting: Obstetrics & Gynecology

## 2020-08-13 ENCOUNTER — Encounter: Payer: Self-pay | Admitting: Internal Medicine

## 2020-08-17 MED FILL — DULoxetine HCL 60 MG CPEP: 60 | 90 days supply | Qty: 90 | Fill #1

## 2020-08-17 MED FILL — PANTOPRAZOLE SOD DR 40 MG T: 40 | 90 days supply | Qty: 90 | Fill #1

## 2020-09-15 ENCOUNTER — Telehealth (HOSPITAL_BASED_OUTPATIENT_CLINIC_OR_DEPARTMENT_OTHER): Payer: Self-pay | Admitting: *Deleted

## 2020-09-15 ENCOUNTER — Other Ambulatory Visit (HOSPITAL_BASED_OUTPATIENT_CLINIC_OR_DEPARTMENT_OTHER): Payer: Self-pay | Admitting: Obstetrics & Gynecology

## 2020-09-15 MED ORDER — NONFORMULARY OR COMPOUNDED ITEM: 1 refills | Status: DC

## 2020-09-15 NOTE — Telephone Encounter (Signed)
Pt called requesting refill on testosterone and estrogen cream to be sent to [place].

## 2020-09-15 NOTE — Telephone Encounter (Signed)
Rx printed and signed to be faxed to [place].  Pt needs AEX appt.  Was scheduled for 05/2020 but cancelled.  Also routing to [place] [facility] to call pt.  Thanks.

## 2020-09-16 ENCOUNTER — Telehealth: Payer: Self-pay | Admitting: Orthopaedic Surgery

## 2020-09-16 ENCOUNTER — Other Ambulatory Visit: Payer: Self-pay | Admitting: Orthopaedic Surgery

## 2020-09-16 MED ORDER — OXYCODONE HCL 5 MG PO TABS: 5.0000 mg | ORAL_TABLET | Freq: Four times a day (QID) | ORAL | 0 refills | Status: DC | PRN

## 2020-09-16 NOTE — Telephone Encounter (Signed)
I [facility] in [place].

## 2020-09-16 NOTE — Telephone Encounter (Signed)
Pt called and states that she needs a refill on oxycodone and she has like 2 more but she will not be able to make it through the weekend.

## 2020-09-17 NOTE — Telephone Encounter (Signed)
I spoke with this pt. She stated the rx was suppose to be for her mom. Another pt of dr. Adela Ports. I got this corrected and cancelled this rx with [facility]

## 2020-11-16 ENCOUNTER — Ambulatory Visit (HOSPITAL_BASED_OUTPATIENT_CLINIC_OR_DEPARTMENT_OTHER): Payer: No Typology Code available for payment source | Admitting: Obstetrics & Gynecology

## 2020-11-17 ENCOUNTER — Other Ambulatory Visit: Payer: Self-pay | Admitting: Internal Medicine

## 2020-11-17 ENCOUNTER — Other Ambulatory Visit (HOSPITAL_COMMUNITY): Payer: Self-pay

## 2020-11-17 MED FILL — Pantoprazole Sodium EC Tab 40 MG (Base Equiv): ORAL | 90 days supply | Qty: 90 | Fill #0 | Status: CN

## 2020-11-18 ENCOUNTER — Other Ambulatory Visit (HOSPITAL_COMMUNITY): Payer: Self-pay

## 2020-11-18 MED ORDER — DULOXETINE HCL 60 MG PO CPEP
ORAL_CAPSULE | ORAL | 1 refills | Status: DC
  Filled 2020-11-18 – 2020-11-26 (×2): qty 90, 90d supply, fill #0
  Filled 2021-03-02: qty 90, 90d supply, fill #1

## 2020-11-25 ENCOUNTER — Other Ambulatory Visit (HOSPITAL_COMMUNITY): Payer: Self-pay

## 2020-11-26 ENCOUNTER — Other Ambulatory Visit (HOSPITAL_COMMUNITY): Payer: Self-pay

## 2020-11-26 MED FILL — Pantoprazole Sodium EC Tab 40 MG (Base Equiv): ORAL | 90 days supply | Qty: 90 | Fill #0 | Status: AC

## 2020-12-15 ENCOUNTER — Telehealth (HOSPITAL_BASED_OUTPATIENT_CLINIC_OR_DEPARTMENT_OTHER): Payer: Self-pay | Admitting: Obstetrics & Gynecology

## 2020-12-15 NOTE — Telephone Encounter (Signed)
Called patient and left a message to please call the office back to scheduled her annual exam with Dr.Miller.

## 2021-03-02 ENCOUNTER — Other Ambulatory Visit (HOSPITAL_COMMUNITY): Payer: Self-pay

## 2021-03-02 ENCOUNTER — Other Ambulatory Visit: Payer: Self-pay | Admitting: Internal Medicine

## 2021-03-02 MED ORDER — PANTOPRAZOLE SODIUM 40 MG PO TBEC
40.0000 mg | DELAYED_RELEASE_TABLET | Freq: Every day | ORAL | 2 refills | Status: DC
  Filled 2021-03-02: qty 90, 90d supply, fill #0
  Filled 2021-06-03: qty 90, 90d supply, fill #1

## 2021-03-04 ENCOUNTER — Encounter: Payer: Self-pay | Admitting: Internal Medicine

## 2021-05-13 ENCOUNTER — Telehealth (HOSPITAL_BASED_OUTPATIENT_CLINIC_OR_DEPARTMENT_OTHER): Payer: Self-pay

## 2021-05-17 NOTE — Telephone Encounter (Signed)
Entered in Error. tbw 

## 2021-05-24 ENCOUNTER — Other Ambulatory Visit (HOSPITAL_BASED_OUTPATIENT_CLINIC_OR_DEPARTMENT_OTHER): Payer: Self-pay | Admitting: Obstetrics & Gynecology

## 2021-05-24 MED ORDER — NONFORMULARY OR COMPOUNDED ITEM
1 refills | Status: DC
Start: 2021-05-24 — End: 2022-01-06

## 2021-05-30 ENCOUNTER — Ambulatory Visit (HOSPITAL_BASED_OUTPATIENT_CLINIC_OR_DEPARTMENT_OTHER): Payer: No Typology Code available for payment source | Admitting: Obstetrics & Gynecology

## 2021-06-03 ENCOUNTER — Other Ambulatory Visit: Payer: Self-pay | Admitting: Internal Medicine

## 2021-06-03 ENCOUNTER — Other Ambulatory Visit (HOSPITAL_COMMUNITY): Payer: Self-pay

## 2021-06-06 ENCOUNTER — Other Ambulatory Visit (HOSPITAL_COMMUNITY): Payer: Self-pay

## 2021-06-06 MED ORDER — DULOXETINE HCL 60 MG PO CPEP
60.0000 mg | ORAL_CAPSULE | Freq: Every day | ORAL | 0 refills | Status: DC
Start: 2021-06-06 — End: 2021-07-12
  Filled 2021-06-06: qty 30, 30d supply, fill #0

## 2021-06-08 ENCOUNTER — Encounter (HOSPITAL_BASED_OUTPATIENT_CLINIC_OR_DEPARTMENT_OTHER): Payer: Self-pay | Admitting: *Deleted

## 2021-06-30 ENCOUNTER — Encounter: Payer: Self-pay | Admitting: Internal Medicine

## 2021-07-01 NOTE — Progress Notes (Signed)
Sarah Mullen D.[place] Phone: [phone]   Assessment and Plan:     1. Chronic right shoulder pain 2. Right forearm pain -Chronic with exacerbation, initial sports medicine visit - Unclear etiology of right shoulder and forearm pain that have been coming and going for several months with history of C3-6 level cervical fusion.  Pain does not fit radicular pattern.  Shoulder pain is nonspecific to any muscle group and patient does not have scapular dysfunction.  Pain at elbow localized to proximal wrist extensors near elbow, however no pain with resisted wrist extension or middle finger extension - as pain is not severe at this time, no significant MOI, pain waxes and wanes, we will start with conservative therapy: - start HEP for shoulders  - start meloxicam 15 mg daily for 3 weeks and may use remainder as needed for pain control    Pertinent previous records reviewed include none   Follow Up: 4 weeks for reevaluation.  If patient does not have significant improvement in symptoms, would consider x-ray of shoulder, thoracic spine.  Could consider OMT versus formal PT versus CSI   Subjective:    I, Sarah Mullen, am serving as a Education administrator for Doctor Glennon Mac  Chief Complaint:left back and arm pain   HPI:  07/04/2021 Patient is a 65 year old female complaining of left back and arm pain. Patient states that the pain is on the scapula area and the forearm area had a three level fusion(2018/19)  shoulder has been hurting for a while no MOI and no specific date   Radiates: yes Mechanical symptoms: Numbness/tingling:yes all the time due to fusion and  Weakness:no  Aggravates:al the time  Treatments tried: yes ibuprofen hurts a lot       Relevant Historical Information: History of C3-6 anterior fusion  Additional pertinent review of systems negative.   Current Outpatient Medications:     meloxicam (MOBIC) 15 MG tablet, Take 1 tablet (15 mg total) by mouth daily., Disp: 30 tablet, Rfl: 0   aspirin 81 MG chewable tablet, Chew 1 tablet (81 mg total) by mouth 2 (two) times daily., Disp: 30 tablet, Rfl: 0   aspirin-acetaminophen-caffeine (EXCEDRIN MIGRAINE) 250-250-65 MG tablet, Take 2 tablets by mouth daily as needed for headache or migraine., Disp: , Rfl:    cetirizine (ZYRTEC) 10 MG tablet, Take 10 mg by mouth daily., Disp: , Rfl:    DULoxetine (CYMBALTA) 60 MG capsule, Take 1 capsule (60 mg total) by mouth daily. MUST KEEP SCHEDULED APPOINTMENT FOR FURTHER REFILLS., Disp: 30 capsule, Rfl: 0   ibuprofen (ADVIL) 800 MG tablet, Take 800 mg by mouth every 8 (eight) hours as needed., Disp: , Rfl:    NONFORMULARY OR COMPOUNDED ITEM, Estradiol cream 0.02% with testosterone 1% cream, Apply 1/2 ml vaginally as directed.  #45 ml grams, Disp: 1 each, Rfl: 1   oxyCODONE (ROXICODONE) 5 MG immediate release tablet, Take 1 tablet (5 mg total) by mouth every 6 (six) hours as needed for severe pain., Disp: 30 tablet, Rfl: 0   pantoprazole (PROTONIX) 40 MG tablet, Take 1 tablet (40 mg total) by mouth daily., Disp: 90 tablet, Rfl: 2   Objective:     Vitals:   07/04/21 0801  BP: 124/76  Pulse: 93  SpO2: 95%  Weight: 175 lb (79.4 kg)  Height: 5\' 1"  (1.549 m)      Body mass index is 33.07 kg/m.    Physical Exam:  Gen: Appears well, nad, nontoxic and pleasant Neuro:sensation intact, strength is 5/5 with df/pf/inv/ev, muscle tone wnl Skin: no suspicious lesion or defmority Psych: A&O, appropriate mood and affect  Right Shoulder: no deformity, swelling or muscle wasting No scapular winging FF 180, abd 180, int 0, ext 90 NTTP over the [place], clavicle, ac, coracoid, biceps groove, humerus, deltoid, trapezius, cervical spine Positive Hawkin's, crossarm, O'Brien Neg neer, empty can, subscap liftoff, speeds Neg ant drawer, sulcus sign, apprehension Negative Spurling's test bilat FROM of  neck  Right ELBOW: no deformity, swelling or muscle wasting Normal Carrying angle ROM:0-140, supination and pronation 90 TTP mildly over wrist extensor musculature just distal to lateral epicondyle NTTP over triceps, ticeps tendon, olecronon, lat epicondyle, medial epicondyle, antecubital fossa, biceps tendon, supinator, pronator Negative tinnels over cubital tunnel No pain with resisted wrist and middle digit extension No pain with resisted wrist flexion No pain with resisted supination No pain with resisted pronation Negative valgus stress Negative varus stress Negative milking maneuver    Electronically signed by:  Sarah Mullen D.[name] 8:36 AM 07/04/21

## 2021-07-04 ENCOUNTER — Other Ambulatory Visit (HOSPITAL_COMMUNITY): Payer: Self-pay

## 2021-07-04 ENCOUNTER — Other Ambulatory Visit: Payer: Self-pay

## 2021-07-04 ENCOUNTER — Ambulatory Visit (INDEPENDENT_AMBULATORY_CARE_PROVIDER_SITE_OTHER): Payer: No Typology Code available for payment source | Admitting: Sports Medicine

## 2021-07-04 VITALS — BP 124/76 | HR 93 | Ht 61.0 in | Wt 175.0 lb

## 2021-07-04 DIAGNOSIS — G8929 Other chronic pain: Secondary | ICD-10-CM

## 2021-07-04 DIAGNOSIS — M25511 Pain in right shoulder: Secondary | ICD-10-CM | POA: Diagnosis not present

## 2021-07-04 DIAGNOSIS — M79631 Pain in right forearm: Secondary | ICD-10-CM | POA: Diagnosis not present

## 2021-07-04 MED ORDER — MELOXICAM 15 MG PO TABS
15.0000 mg | ORAL_TABLET | Freq: Every day | ORAL | 0 refills | Status: DC
  Filled 2021-07-04: qty 30, 30d supply, fill #0

## 2021-07-04 NOTE — Patient Instructions (Addendum)
Good to see you  Meloxicam for 3 weeks daily  Shoulder HEP 1 month follow up

## 2021-07-11 ENCOUNTER — Encounter: Payer: Self-pay | Admitting: Internal Medicine

## 2021-07-11 NOTE — Progress Notes (Signed)
Subjective:    Patient ID: Sarah Mullen, female    DOB: Oct 28, 1955, 65 y.o.   MRN: [id number]   This visit occurred during the SARS-CoV-2 public health emergency.  Safety protocols were in place, including screening questions prior to the visit, additional usage of staff PPE, and extensive cleaning of exam room while observing appropriate contact time as indicated for disinfecting solutions.    HPI She is here for a physical exam.   Last a1c was 6.5% 03/2020  Feels tired all the time.  She gets about 5-7 hours of sleep at night.  She is not currently exercising.  She has gained some weight.  Medications and allergies reviewed with patient and updated if appropriate.  Patient Active Problem List   Diagnosis Date Noted   Status post total replacement of right hip 08/05/2019   Unilateral primary osteoarthritis, right hip 06/23/2019   Nonallopathic lesion of sacral region 06/13/2019   Nonallopathic lesion of lumbosacral region 06/13/2019   Nonallopathic lesion of thoracic region 06/13/2019   Right hip pain 06/13/2019   Left knee pain 01/17/2019   Spinal stenosis in cervical region 03/27/2018   Testosterone deficiency 08/22/2017   Hyperlipidemia 08/22/2017   Chronic lower back pain 08/22/2017   Cervical myelopathy ([place]) 05/15/2016   Vitamin D deficiency 05/15/2016   Prediabetes 05/14/2016   Fatty liver 03/09/2016   Positive TB test 05/28/2012   GERD (gastroesophageal reflux disease) 11/21/2010   Dyshidrotic eczema 11/21/2010    Current Outpatient Medications on File Prior to Visit  Medication Sig Dispense Refill   aspirin 81 MG chewable tablet Chew 1 tablet (81 mg total) by mouth 2 (two) times daily. 30 tablet 0   aspirin-acetaminophen-caffeine (EXCEDRIN MIGRAINE) 250-250-65 MG tablet Take 2 tablets by mouth daily as needed for headache or migraine.     cetirizine (ZYRTEC) 10 MG tablet Take 10 mg by mouth daily.     DULoxetine (CYMBALTA) 60 MG capsule Take 1 capsule (60 mg  total) by mouth daily. MUST KEEP SCHEDULED APPOINTMENT FOR FURTHER REFILLS. 30 capsule 0   ibuprofen (ADVIL) 800 MG tablet Take 800 mg by mouth every 8 (eight) hours as needed.     meloxicam (MOBIC) 15 MG tablet Take 1 tablet (15 mg total) by mouth daily. 30 tablet 0   NONFORMULARY OR COMPOUNDED ITEM Estradiol cream 0.02% with testosterone 1% cream, Apply 1/2 ml vaginally as directed.  #45 ml grams 1 each 1   pantoprazole (PROTONIX) 40 MG tablet Take 1 tablet (40 mg total) by mouth daily. 90 tablet 2   [DISCONTINUED] progesterone (PROMETRIUM) 200 MG capsule Take 200 mg by mouth daily.       No current facility-administered medications on file prior to visit.    Past Medical History:  Diagnosis Date   Allergic rhinitis, cause unspecified    Blood transfusion    iron transfusion- [place]-2009   Chronic headaches    Depression    Diverticulitis    GERD (gastroesophageal reflux disease)    Heart murmur    diag with echo-physiologic murmur; trace MR, trivial, early systolic MR by 74/08/14 echo ([facility])   History of sinus tachycardia    07/04/07 Holter monitor: 69-154, average HR 95 bpm. Occ PACs, 4 runs probable atrial tachycardia   Menometrorrhagia 11/21/2010   Migraines    Pre-diabetes     Past Surgical History:  Procedure Laterality Date   ABDOMINAL HYSTERECTOMY  09/2011   ANTERIOR CERVICAL DECOMP/DISCECTOMY FUSION N/A 03/27/2018  Procedure: Anteroir Cervical Decrompression Fusion - Cervical Three - Cervical Four - Cervical Four - Cervical Five - Cervical Five - Cervical Six;  Surgeon: Kary Kos, MD;  Location: [facility];  Service: Neurosurgery;  Laterality: N/A;  Anteroir Cervical Decrompression Fusion - Cervical Three - Cervical Four - Cervical Four - Cervical Five - Cervical Five - Cervical Six   BREAST BIOPSY     right breast/benign   CERCLAGE REMOVAL  1986   WHEN pregnant   CESAREAN SECTION  1986   1 time   COLONOSCOPY     CYSTOSCOPY  10/17/2011   Procedure:  CYSTOSCOPY;  Surgeon: Megan Salon, MD;  Location: [facility];  Service: Gynecology;;   DILATION AND CURETTAGE OF UTERUS     ESOPHAGOGASTRODUODENOSCOPY     TONSILLECTOMY     TOTAL HIP ARTHROPLASTY Right 08/05/2019   Procedure: RIGHT TOTAL HIP ARTHROPLASTY ANTERIOR APPROACH;  Surgeon: Mcarthur Rossetti, MD;  Location: [facility];  Service: Orthopedics;  Laterality: Right;    Social History   Socioeconomic History   Marital status: Single    Spouse name: Not on file   Number of children: Not on file   Years of education: Not on file   Highest education level: Not on file  Occupational History   Not on file  Tobacco Use   Smoking status: Former    Types: Cigarettes    Quit date: 10/09/2001    Years since quitting: 19.7   Smokeless tobacco: Never  Vaping Use   Vaping Use: Never used  Substance and Sexual Activity   Alcohol use: No    Alcohol/week: 0.0 standard drinks   Drug use: No   Sexual activity: Yes    Partners: Male    Birth control/protection: Surgical    Comment: TLH/BSO  Other Topics Concern   Not on file  Social History Narrative   Not on file   Social Determinants of Health   Financial Resource Strain: Not on file  Food Insecurity: Not on file  Transportation Needs: Not on file  Physical Activity: Not on file  Stress: Not on file  Social Connections: Not on file    Family History  Problem Relation Age of Onset   Arthritis Mother    Hyperlipidemia Mother    Heart disease Mother    Arthritis Father    Lung cancer Father        smoker   Heart disease Father    Hypertension Father    Diabetes Father    Colon cancer Paternal Aunt    CAD Paternal Grandmother    Kidney disease Maternal Grandfather        ESRD on HD   Kidney disease Maternal Grandmother        ESRD on HD   Diabetes Brother    Metabolic syndrome Sister    Stomach cancer Neg Hx    Rectal cancer Neg Hx    Esophageal cancer Neg Hx     Review of Systems  Constitutional:  Positive for  fatigue. Negative for chills and fever.  Eyes:  Negative for visual disturbance.  Respiratory:  Positive for shortness of breath (occ with strenuous activity - deconditioning). Negative for cough and wheezing.   Cardiovascular:  Negative for chest pain, palpitations and leg swelling.  Gastrointestinal:  Negative for abdominal pain, blood in stool, constipation, diarrhea and nausea.       Occ gerd  Genitourinary:  Negative for dysuria.  Musculoskeletal:  Positive for arthralgias and back pain.  Skin:  Positive for rash (eczema).  Neurological:  Negative for light-headedness and headaches.  Psychiatric/Behavioral:  Positive for sleep disturbance (ok but only getting 5-7 hours). Negative for dysphoric mood. The patient is not nervous/anxious.       Objective:   Vitals:   07/12/21 1307  BP: 114/72  Pulse: 95  Temp: 98.2 F (36.8 C)  SpO2: 97%   Filed Weights   07/12/21 1307  Weight: 176 lb (79.8 kg)   Body mass index is 33.25 kg/m.  BP Readings from Last 3 Encounters:  07/12/21 114/72  07/04/21 124/76  04/14/20 128/80    Wt Readings from Last 3 Encounters:  07/12/21 176 lb (79.8 kg)  07/04/21 175 lb (79.4 kg)  04/14/20 173 lb 3.2 oz (78.6 kg)    Depression screen [facility] 2/9 04/14/2020 03/17/2019 08/22/2017 09/24/2012  Decreased Interest 0 0 0 0  Down, Depressed, Hopeless 0 0 0 0  PHQ - 2 Score 0 0 0 0  Altered sleeping - 0 - -  Tired, decreased energy - 1 - -  Change in appetite - 0 - -  Feeling bad or failure about yourself  - 0 - -  Trouble concentrating - 0 - -  Moving slowly or fidgety/restless - 0 - -  Suicidal thoughts - 0 - -  PHQ-9 Score - 1 - -     GAD 7 : Generalized Anxiety Score 03/17/2019  Nervous, Anxious, on Edge 0  Control/stop worrying 0  Worry too much - different things 0  Trouble relaxing 0  Restless 0  Easily annoyed or irritable 0  Afraid - awful might happen 0  Total GAD 7 Score 0       Physical Exam Constitutional: She appears  well-developed and well-nourished. No distress.  HENT:  Head: Normocephalic and atraumatic.  Right Ear: External ear normal. Normal ear canal and TM Left Ear: External ear normal.  Normal ear canal and TM Mouth/Throat: Oropharynx is clear and moist.  Eyes: Conjunctivae and EOM are normal.  Neck: Neck supple. No tracheal deviation present. No thyromegaly present.  No carotid bruit  Cardiovascular: Normal rate, regular rhythm and normal heart sounds.   No murmur heard.  No edema. Pulmonary/Chest: Effort normal and breath sounds normal. No respiratory distress. She has no wheezes. She has no rales.  Breast: deferred   Abdominal: Soft. She exhibits no distension. There is no tenderness.  Lymphadenopathy: She has no cervical adenopathy.  Skin: Skin is warm and dry. She is not diaphoretic.  Psychiatric: She has a normal mood and affect. Her behavior is normal.     Lab Results  Component Value Date   WBC 7.5 04/14/2020   HGB 12.0 04/14/2020   HCT 38.3 04/14/2020   PLT 352 04/14/2020   GLUCOSE 96 04/14/2020   CHOL 181 04/14/2020   TRIG 81 04/14/2020   HDL 60 04/14/2020   LDLDIRECT 149.3 05/22/2012   LDLCALC 104 (H) 04/14/2020   ALT 31 (H) 04/14/2020   AST 23 04/14/2020   NA 141 04/14/2020   K 4.9 04/14/2020   CL 105 04/14/2020   CREATININE 0.67 04/14/2020   BUN 20 04/14/2020   CO2 29 04/14/2020   TSH 2.30 04/14/2020   HGBA1C 6.5 (H) 04/14/2020         Assessment & Plan:   Physical exam: Screening blood work  ordered Exercise  none-stressed the importance of regular exercise Weight  has gained weight -- discussed weight loss Substance abuse  none   Reviewed  recommended immunizations.   Health Maintenance  Topic Date Due   COVID-19 Vaccine (1) Never done   Pneumonia Vaccine 73+ Years old (1 - PCV) Never done   Zoster Vaccines- Shingrix (1 of 2) Never done   DEXA SCAN  02/05/2020   INFLUENZA VACCINE  02/21/2021   MAMMOGRAM  05/31/2023   TETANUS/TDAP  05/14/2026    COLONOSCOPY (Pts 45-34yrs Insurance coverage will need to be confirmed)  07/16/2028   Hepatitis C Screening  Completed   HPV VACCINES  Aged Out          See Problem List for Assessment and Plan of chronic medical problems.

## 2021-07-11 NOTE — Patient Instructions (Addendum)
Blood work was ordered.     Medications changes include :  none   Your prescription(s) have been submitted to your pharmacy. Please take as directed and contact our office if you believe you are having problem(s) with the medication(s).    Please followup in 6 months    Health Maintenance, Female Adopting a healthy lifestyle and getting preventive care are important in promoting health and wellness. Ask your health care provider about: The right schedule for you to have regular tests and exams. Things you can do on your own to prevent diseases and keep yourself healthy. What should I know about diet, weight, and exercise? Eat a healthy diet  Eat a diet that includes plenty of vegetables, fruits, low-fat dairy products, and lean protein. Do not eat a lot of foods that are high in solid fats, added sugars, or sodium. Maintain a healthy weight Body mass index (BMI) is used to identify weight problems. It estimates body fat based on height and weight. Your health care provider can help determine your BMI and help you achieve or maintain a healthy weight. Get regular exercise Get regular exercise. This is one of the most important things you can do for your health. Most adults should: Exercise for at least 150 minutes each week. The exercise should increase your heart rate and make you sweat (moderate-intensity exercise). Do strengthening exercises at least twice a week. This is in addition to the moderate-intensity exercise. Spend less time sitting. Even light physical activity can be beneficial. Watch cholesterol and blood lipids Have your blood tested for lipids and cholesterol at 66 years of age, then have this test every 5 years. Have your cholesterol levels checked more often if: Your lipid or cholesterol levels are high. You are older than 66 years of age. You are at high risk for heart disease. What should I know about cancer screening? Depending on your health history and  family history, you may need to have cancer screening at various ages. This may include screening for: Breast cancer. Cervical cancer. Colorectal cancer. Skin cancer. Lung cancer. What should I know about heart disease, diabetes, and high blood pressure? Blood pressure and heart disease High blood pressure causes heart disease and increases the risk of stroke. This is more likely to develop in people who have high blood pressure readings or are overweight. Have your blood pressure checked: Every 3-5 years if you are 25-37 years of age. Every year if you are 30 years old or older. Diabetes Have regular diabetes screenings. This checks your fasting blood sugar level. Have the screening done: Once every three years after age 51 if you are at a normal weight and have a low risk for diabetes. More often and at a younger age if you are overweight or have a high risk for diabetes. What should I know about preventing infection? Hepatitis B If you have a higher risk for hepatitis B, you should be screened for this virus. Talk with your health care provider to find out if you are at risk for hepatitis B infection. Hepatitis C Testing is recommended for: Everyone born from [birth year] through 1966. Anyone with known risk factors for hepatitis C. Sexually transmitted infections (STIs) Get screened for STIs, including gonorrhea and chlamydia, if: You are sexually active and are younger than 66 years of age. You are older than 66 years of age and your health care provider tells you that you are at risk for this type of infection. Your sexual activity has  changed since you were last screened, and you are at increased risk for chlamydia or gonorrhea. Ask your health care provider if you are at risk. Ask your health care provider about whether you are at high risk for HIV. Your health care provider may recommend a prescription medicine to help prevent HIV infection. If you choose to take medicine to prevent HIV,  you should first get tested for HIV. You should then be tested every 3 months for as long as you are taking the medicine. Pregnancy If you are about to stop having your period (premenopausal) and you may become pregnant, seek counseling before you get pregnant. Take 400 to 800 micrograms (mcg) of folic acid every day if you become pregnant. Ask for birth control (contraception) if you want to prevent pregnancy. Osteoporosis and menopause Osteoporosis is a disease in which the bones lose minerals and strength with aging. This can result in bone fractures. If you are 22 years old or older, or if you are at risk for osteoporosis and fractures, ask your health care provider if you should: Be screened for bone loss. Take a calcium or vitamin D supplement to lower your risk of fractures. Be given hormone replacement therapy (HRT) to treat symptoms of menopause. Follow these instructions at home: Alcohol use Do not drink alcohol if: Your health care provider tells you not to drink. You are pregnant, may be pregnant, or are planning to become pregnant. If you drink alcohol: Limit how much you have to: 0-1 drink a day. Know how much alcohol is in your drink. In the [place] one drink equals one 12 oz bottle of beer (355 mL), one 5 oz glass of wine (148 mL), or one 1 oz glass of hard liquor (44 mL). Lifestyle Do not use any products that contain nicotine or tobacco. These products include cigarettes, chewing tobacco, and vaping devices, such as e-cigarettes. If you need help quitting, ask your health care provider. Do not use street drugs. Do not share needles. Ask your health care provider for help if you need support or information about quitting drugs. General instructions Schedule regular health, dental, and eye exams. Stay current with your vaccines. Tell your health care provider if: You often feel depressed. You have ever been abused or do not feel safe at home. Summary Adopting a healthy  lifestyle and getting preventive care are important in promoting health and wellness. Follow your health care provider's instructions about healthy diet, exercising, and getting tested or screened for diseases. Follow your health care provider's instructions on monitoring your cholesterol and blood pressure. This information is not intended to replace advice given to you by your health care provider. Make sure you discuss any questions you have with your health care provider. Document Revised: 11/29/2020 Document Reviewed: 11/29/2020 Elsevier Patient Education  [place].

## 2021-07-12 ENCOUNTER — Ambulatory Visit (INDEPENDENT_AMBULATORY_CARE_PROVIDER_SITE_OTHER): Payer: No Typology Code available for payment source | Admitting: Internal Medicine

## 2021-07-12 ENCOUNTER — Other Ambulatory Visit: Payer: Self-pay

## 2021-07-12 ENCOUNTER — Other Ambulatory Visit (HOSPITAL_COMMUNITY): Payer: Self-pay

## 2021-07-12 VITALS — BP 114/72 | HR 95 | Temp 98.2°F | Ht 61.0 in | Wt 176.0 lb

## 2021-07-12 DIAGNOSIS — E7849 Other hyperlipidemia: Secondary | ICD-10-CM

## 2021-07-12 DIAGNOSIS — Z0000 Encounter for general adult medical examination without abnormal findings: Secondary | ICD-10-CM

## 2021-07-12 DIAGNOSIS — Z1331 Encounter for screening for depression: Secondary | ICD-10-CM

## 2021-07-12 DIAGNOSIS — E559 Vitamin D deficiency, unspecified: Secondary | ICD-10-CM | POA: Diagnosis not present

## 2021-07-12 DIAGNOSIS — R7303 Prediabetes: Secondary | ICD-10-CM | POA: Diagnosis not present

## 2021-07-12 DIAGNOSIS — G8929 Other chronic pain: Secondary | ICD-10-CM

## 2021-07-12 DIAGNOSIS — Z13820 Encounter for screening for osteoporosis: Secondary | ICD-10-CM

## 2021-07-12 DIAGNOSIS — M545 Low back pain, unspecified: Secondary | ICD-10-CM

## 2021-07-12 DIAGNOSIS — K219 Gastro-esophageal reflux disease without esophagitis: Secondary | ICD-10-CM | POA: Diagnosis not present

## 2021-07-12 LAB — COMPREHENSIVE METABOLIC PANEL
ALT: 53 U/L — ABNORMAL HIGH (ref 0–35)
AST: 38 U/L — ABNORMAL HIGH (ref 0–37)
Albumin: 4 g/dL (ref 3.5–5.2)
Alkaline Phosphatase: 141 U/L — ABNORMAL HIGH (ref 39–117)
BUN: 24 mg/dL — ABNORMAL HIGH (ref 6–23)
CO2: 29 mEq/L (ref 19–32)
Calcium: 9.9 mg/dL (ref 8.4–10.5)
Chloride: 104 mEq/L (ref 96–112)
Creatinine, Ser: 0.62 mg/dL (ref 0.40–1.20)
GFR: 93.05 mL/min (ref >60.00)
Glucose, Bld: 106 mg/dL — ABNORMAL HIGH (ref 70–99)
Potassium: 3.9 mEq/L (ref 3.5–5.1)
Sodium: 139 mEq/L (ref 135–145)
Total Bilirubin: 0.4 mg/dL (ref 0.2–1.2)
Total Protein: 7.1 g/dL (ref 6.0–8.3)

## 2021-07-12 LAB — CBC WITH DIFFERENTIAL/PLATELET
Basophils Absolute: 0 10*3/uL (ref 0.0–0.1)
Basophils Relative: 0.5 % (ref 0.0–3.0)
Eosinophils Absolute: 0.3 10*3/uL (ref 0.0–0.7)
Eosinophils Relative: 4.3 % (ref 0.0–5.0)
HCT: 36.7 % (ref 36.0–46.0)
Hemoglobin: 11.6 g/dL — ABNORMAL LOW (ref 12.0–15.0)
Lymphocytes Relative: 31.1 % (ref 12.0–46.0)
Lymphs Abs: 2.5 10*3/uL (ref 0.7–4.0)
MCHC: 31.7 g/dL (ref 30.0–36.0)
MCV: 80.7 fl (ref 78.0–100.0)
Monocytes Absolute: 0.5 10*3/uL (ref 0.1–1.0)
Monocytes Relative: 6.5 % (ref 3.0–12.0)
Neutro Abs: 4.6 10*3/uL (ref 1.4–7.7)
Neutrophils Relative %: 57.6 % (ref 43.0–77.0)
Platelets: 303 10*3/uL (ref 150.0–400.0)
RBC: 4.55 Mil/uL (ref 3.87–5.11)
RDW: 16.1 % — ABNORMAL HIGH (ref 11.5–15.5)
WBC: 8 10*3/uL (ref 4.0–10.5)

## 2021-07-12 LAB — LIPID PANEL
Cholesterol: 162 mg/dL (ref 0–200)
HDL: 50.4 mg/dL (ref >39.00)
LDL Cholesterol: 93 mg/dL (ref 0–99)
NonHDL: 111.53
Total CHOL/HDL Ratio: 3
Triglycerides: 94 mg/dL (ref 0.0–149.0)
VLDL: 18.8 mg/dL (ref 0.0–40.0)

## 2021-07-12 LAB — HEMOGLOBIN A1C: Hgb A1c MFr Bld: 7.5 % — ABNORMAL HIGH (ref 4.6–6.5)

## 2021-07-12 LAB — TSH: TSH: 0.77 u[IU]/mL (ref 0.35–5.50)

## 2021-07-12 LAB — VITAMIN D 25 HYDROXY (VIT D DEFICIENCY, FRACTURES): VITD: 13.02 ng/mL — ABNORMAL LOW (ref 30.00–100.00)

## 2021-07-12 MED ORDER — DULOXETINE HCL 60 MG PO CPEP
60.0000 mg | ORAL_CAPSULE | Freq: Every day | ORAL | 1 refills | Status: DC
  Filled 2021-07-12: qty 90, 90d supply, fill #0
  Filled 2021-10-24: qty 90, 90d supply, fill #1

## 2021-07-12 MED ORDER — TRIAMCINOLONE ACETONIDE 0.1 % EX CREA
1.0000 "application " | TOPICAL_CREAM | Freq: Two times a day (BID) | CUTANEOUS | 2 refills | Status: DC
  Filled 2021-07-12: qty 30, 15d supply, fill #0
  Filled 2022-04-10: qty 30, 30d supply, fill #1

## 2021-07-12 MED ORDER — PANTOPRAZOLE SODIUM 40 MG PO TBEC
40.0000 mg | DELAYED_RELEASE_TABLET | Freq: Every day | ORAL | 1 refills | Status: DC
  Filled 2021-07-12 – 2021-10-07 (×2): qty 90, 90d supply, fill #0
  Filled 2022-01-02: qty 90, 90d supply, fill #1

## 2021-07-12 NOTE — Assessment & Plan Note (Addendum)
Chronic GERD controlled Continue pantoprazole 40 mg daily - will try taper off periodically

## 2021-07-12 NOTE — Assessment & Plan Note (Signed)
Chronic Taking vitamin D daily Check vitamin D level  

## 2021-07-12 NOTE — Assessment & Plan Note (Signed)
Chronic Check lipid panel  Diet controlled Regular exercise and healthy diet encouraged

## 2021-07-12 NOTE — Assessment & Plan Note (Addendum)
Chronic Last a1c was 6.5 %-if A1c is still 6.5% or greater will be a diabetic Check a1c Low sugar / carb diet Stressed regular exercise Discussed the importance of weight loss

## 2021-07-12 NOTE — Assessment & Plan Note (Addendum)
Chronic Taking  cymbalta 60 mg daily Continue above

## 2021-07-13 ENCOUNTER — Ambulatory Visit (INDEPENDENT_AMBULATORY_CARE_PROVIDER_SITE_OTHER): Payer: No Typology Code available for payment source | Admitting: Sports Medicine

## 2021-07-13 ENCOUNTER — Other Ambulatory Visit (HOSPITAL_COMMUNITY): Payer: Self-pay

## 2021-07-13 ENCOUNTER — Ambulatory Visit: Payer: No Typology Code available for payment source

## 2021-07-13 ENCOUNTER — Ambulatory Visit (INDEPENDENT_AMBULATORY_CARE_PROVIDER_SITE_OTHER): Payer: No Typology Code available for payment source

## 2021-07-13 VITALS — BP 120/80 | HR 95 | Ht 61.0 in | Wt 174.0 lb

## 2021-07-13 DIAGNOSIS — M4802 Spinal stenosis, cervical region: Secondary | ICD-10-CM | POA: Diagnosis not present

## 2021-07-13 DIAGNOSIS — M25511 Pain in right shoulder: Secondary | ICD-10-CM

## 2021-07-13 DIAGNOSIS — M542 Cervicalgia: Secondary | ICD-10-CM

## 2021-07-13 DIAGNOSIS — Z981 Arthrodesis status: Secondary | ICD-10-CM

## 2021-07-13 DIAGNOSIS — R29898 Other symptoms and signs involving the musculoskeletal system: Secondary | ICD-10-CM | POA: Diagnosis not present

## 2021-07-13 MED ORDER — VITAMIN D (ERGOCALCIFEROL) 1.25 MG (50000 UNIT) PO CAPS
50000.0000 [IU] | ORAL_CAPSULE | ORAL | 0 refills | Status: DC
  Filled 2021-07-13: qty 8, 56d supply, fill #0

## 2021-07-13 NOTE — Patient Instructions (Addendum)
Good to see you  MRI referral [place]  [phone] Neurosurgery referral sent to Dr Saintclair Halsted Work note provided Follow up after MRI to discuss results

## 2021-07-13 NOTE — Progress Notes (Signed)
Sarah Mullen Sarah Mullen [place] Phone: [phone]   Assessment and Plan:     1. Right shoulder pain, unspecified chronicity 2. Cervical pain (neck) 3. Weakness of right hand 4. Spinal stenosis in cervical region 5. 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  May use remainder as needed for pain control - We will order cervical spine MRI to evaluate progression due to new numbness and tingling and hand weakness - We will refer back to patient's neurosurgeon Sarah Mullen for further evaluation -X-rays obtained in clinic.  My interpretation: No acute fracture or dislocation of right shoulder.  Joint spaces maintained with minimal cortical changes.  Decreased joint space at [facility] joint with cortical changes.  C3-6 anterior cervical fusion in place with progressive loss of disc height at C7 and worsening DDD at this level   Pertinent previous records reviewed include C-spine x-ray 2019, C-spine MRI 2019   Follow Up: After MRI to review results and discuss treatment plan.  Could consider facet injection versus epidural.  Patient may also follow-up with neurosurgery for treatment plan   Subjective:   I, Sarah Mullen, am serving as a Education administrator for Sarah Mullen  Chief Complaint: numbness in 4th and 5th fingers right hand and pain in the right scapula through shoulder down to hand   HPI:   07/04/2021 Patient is a 65 year old female complaining of right back and arm pain. Patient states that the pain is on the scapula area and the forearm area had a  three level fusion(2018/19)  shoulder has been hurting for a while no MOI and no specific date    Radiates: yes Mechanical symptoms: Numbness/tingling:yes all the time due to fusion and  Weakness:no  Aggravates:al the time  Treatments tried: yes ibuprofen hurts a lot    07/13/21 Patient is a 65 year old female complaining of right numbness in 4th and 5th fingers right hand and pain in the right scapula through shoulder down to hand. Patient states she does not have an MOI   Radiates: yes Mechanical symptoms: Numbness/tingling:yes Weakness:yes Aggravates:anything  Treatments tried: heat cold bio freeze ib, tylenol   Relevant Historical Information: History of C 3-6 anterior fusion   Additional pertinent review of systems negative.   Current Outpatient Medications:    aspirin 81 MG chewable tablet, Chew 1 tablet (81 mg total) by mouth 2 (two) times daily., Disp: 30 tablet, Rfl: 0   aspirin-acetaminophen-caffeine (EXCEDRIN MIGRAINE) 250-250-65 MG tablet, Take 2 tablets by mouth daily as needed for headache or migraine., Disp: , Rfl:    cetirizine (ZYRTEC) 10 MG tablet, Take 10 mg by mouth daily., Disp: , Rfl:    DULoxetine (CYMBALTA) 60 MG capsule, Take 1 capsule (60 mg total) by mouth daily., Disp: 90 capsule, Rfl: 1   ibuprofen (ADVIL) 800 MG tablet, Take 800 mg by mouth every 8 (eight) hours as needed., Disp: , Rfl:    meloxicam (MOBIC) 15 MG tablet, Take 1 tablet (15 mg total) by mouth daily., Disp: 30 tablet, Rfl: 0   NONFORMULARY OR COMPOUNDED ITEM, Estradiol cream 0.02% with testosterone 1% cream,  Apply 1/2 ml vaginally as directed.  #45 ml grams, Disp: 1 each, Rfl: 1   pantoprazole (PROTONIX) 40 MG tablet, Take 1 tablet (40 mg total) by mouth daily., Disp: 90 tablet, Rfl: 1   triamcinolone cream (KENALOG) 0.1 %, Apply 1 application topically 2 (two) times daily., Disp: 30 g, Rfl: 2   Vitamin D, Ergocalciferol, (DRISDOL) 1.25 MG (50000 UNIT) CAPS capsule, Take 1 capsule (50,000  Units total) by mouth every 7 (seven) days., Disp: 8 capsule, Rfl: 0   Objective:     Vitals:   07/13/21 1113  BP: 120/80  Pulse: 95  SpO2: 99%  Weight: 174 lb (78.9 kg)  Height: 5\' 1"  (1.549 m)      Body mass index is 32.88 kg/m.    Physical Exam:    Cervical Spine: Posture normal Skin: normal, intact  Neurological:   Strength:  Right  Left   Deltoid 5/5 5/5  Bicep 5/5  5/5  Tricep 5/5 5/5  Wrist Flexion 5/5 5/5  Wrist Extension 5/5 5/5  Grip 5/5 5/5  Finger Abduction 5/5 5/5   Sensation: Decreased sensation over medial forearm, fourth fifth digits on right, otherwise intact to light touch in upper extremities bilaterally  Spurling's:  negative bilaterally Neck ROM: Decreased bilateral sidebending and rotation TTP: Cervical paraspinal, trapezius NTTP:  cervical spinous processes, thoracic paraspinal,    Electronically signed by:  Sarah Mullen D.Sarah Mullen [facility] 11:49 AM 07/13/21

## 2021-07-13 NOTE — Addendum Note (Signed)
Addended by: Binnie Rail on: 07/13/2021 05:19 AM   Modules accepted: Orders

## 2021-07-14 ENCOUNTER — Other Ambulatory Visit (HOSPITAL_COMMUNITY): Payer: Self-pay

## 2021-07-14 MED ORDER — METHYLPREDNISOLONE 4 MG PO TBPK
ORAL_TABLET | ORAL | 0 refills | Status: DC
  Filled 2021-07-14: qty 21, 6d supply, fill #0

## 2021-07-15 ENCOUNTER — Encounter: Payer: Self-pay | Admitting: Internal Medicine

## 2021-07-15 ENCOUNTER — Other Ambulatory Visit (HOSPITAL_COMMUNITY): Payer: Self-pay

## 2021-07-15 ENCOUNTER — Encounter: Payer: Self-pay | Admitting: Sports Medicine

## 2021-07-18 ENCOUNTER — Other Ambulatory Visit: Payer: Self-pay

## 2021-07-18 ENCOUNTER — Ambulatory Visit (INDEPENDENT_AMBULATORY_CARE_PROVIDER_SITE_OTHER): Payer: No Typology Code available for payment source

## 2021-07-18 DIAGNOSIS — M4802 Spinal stenosis, cervical region: Secondary | ICD-10-CM

## 2021-07-18 DIAGNOSIS — R29898 Other symptoms and signs involving the musculoskeletal system: Secondary | ICD-10-CM | POA: Diagnosis not present

## 2021-07-18 DIAGNOSIS — M25511 Pain in right shoulder: Secondary | ICD-10-CM

## 2021-07-18 DIAGNOSIS — M542 Cervicalgia: Secondary | ICD-10-CM | POA: Diagnosis not present

## 2021-07-19 MED ORDER — OXYCODONE HCL 10 MG PO TABS: 10.0000 mg | ORAL_TABLET | Freq: Four times a day (QID) | ORAL | 0 refills | Status: DC | PRN

## 2021-07-19 NOTE — Progress Notes (Unsigned)
Called and left voice message stating no emergent findings on MRI.  I agree with her previous plan of patient reestablishing with her neurosurgeon.  Per patient, she has an appointment on 07/26/2021.  She remains in extensive pain despite our previously discussed pain treatment plan.  She has well tolerated oxycodone in the past, so we will prescribe this as a one-time medication to get her through the next week until she can be seen by neurosurgery.

## 2021-07-26 ENCOUNTER — Other Ambulatory Visit (HOSPITAL_COMMUNITY): Payer: Self-pay

## 2021-07-26 MED ORDER — MELOXICAM 15 MG PO TABS
15.0000 mg | ORAL_TABLET | Freq: Every day | ORAL | 0 refills | Status: DC
  Filled 2021-07-26: qty 40, 40d supply, fill #0

## 2021-07-28 ENCOUNTER — Other Ambulatory Visit (HOSPITAL_COMMUNITY): Payer: Self-pay

## 2021-08-01 ENCOUNTER — Other Ambulatory Visit: Payer: Self-pay

## 2021-08-01 ENCOUNTER — Encounter: Payer: Self-pay | Admitting: Rehabilitative and Restorative Service Providers"

## 2021-08-01 ENCOUNTER — Ambulatory Visit
Payer: No Typology Code available for payment source | Attending: Neurosurgery | Admitting: Rehabilitative and Restorative Service Providers"

## 2021-08-01 ENCOUNTER — Telehealth: Payer: Self-pay | Admitting: Internal Medicine

## 2021-08-01 DIAGNOSIS — M6281 Muscle weakness (generalized): Secondary | ICD-10-CM | POA: Insufficient documentation

## 2021-08-01 DIAGNOSIS — R293 Abnormal posture: Secondary | ICD-10-CM | POA: Diagnosis not present

## 2021-08-01 DIAGNOSIS — R29898 Other symptoms and signs involving the musculoskeletal system: Secondary | ICD-10-CM | POA: Insufficient documentation

## 2021-08-01 DIAGNOSIS — M542 Cervicalgia: Secondary | ICD-10-CM | POA: Insufficient documentation

## 2021-08-01 NOTE — Telephone Encounter (Signed)
Faxed order from December.

## 2021-08-01 NOTE — Telephone Encounter (Signed)
Patient has a bone density appointment at 11 am needs referral sent over to [facility]. The fax number is [phone].

## 2021-08-01 NOTE — Patient Instructions (Signed)
Access Code: [email or web address] URL: [email or web address] Date: 08/01/2021 Prepared by: Gillermo Murdoch  Exercises Seated Cervical Retraction - 3 x daily - 7 x weekly - 1 sets - 10 reps Standing Scapular Retraction - 3 x daily - 7 x weekly - 1 sets - 10 reps - 10 hold Shoulder External Rotation and Scapular Retraction - 3 x daily - 7 x weekly - 1 sets - 10 reps - hold Shoulder External Rotation in 45 Degrees Abduction - 2 x daily - 7 x weekly - 1-2 sets - 10 reps - 3 sec hold Doorway Pec Stretch at 60 Degrees Abduction - 3 x daily - 7 x weekly - 1 sets - 3 reps Doorway Pec Stretch at 90 Degrees Abduction - 3 x daily - 7 x weekly - 1 sets - 3 reps - 30 seconds hold Doorway Pec Stretch at 120 Degrees Abduction - 3 x daily - 7 x weekly - 1 sets - 3 reps - 30 second hold hold

## 2021-08-01 NOTE — Therapy (Signed)
[facility] [place], [place], [address] Phone: [phone]   Fax:  [phone]  Physical Therapy Evaluation  Patient Details  Name: Sarah Mullen MRN: [id number] Date of Birth: 04-22-1956 Referring Provider (PT): Dr Kary Kos   Encounter Date: 08/01/2021   PT End of Session - 08/01/21 0932     Visit Number 1    Number of Visits 12    Date for PT Re-Evaluation 09/12/21    PT Start Time 0800    PT Stop Time 0845    PT Time Calculation (min) 45 min    Activity Tolerance Patient tolerated treatment well             Past Medical History:  Diagnosis Date   Allergic rhinitis, cause unspecified    Blood transfusion    iron transfusion- [place]-2009   Chronic headaches    Depression    Diverticulitis    GERD (gastroesophageal reflux disease)    Heart murmur    diag with echo-physiologic murmur; trace MR, trivial, early systolic MR by 75/17/00 echo ([facility])   History of sinus tachycardia    07/04/07 Holter monitor: 69-154, average HR 95 bpm. Occ PACs, 4 runs probable atrial tachycardia   Menometrorrhagia 11/21/2010   Migraines    Pre-diabetes     Past Surgical History:  Procedure Laterality Date   ABDOMINAL HYSTERECTOMY  09/2011   ANTERIOR CERVICAL DECOMP/DISCECTOMY FUSION N/A 03/27/2018   Procedure: Anteroir Cervical Decrompression Fusion - Cervical Three - Cervical Four - Cervical Four - Cervical Five - Cervical Five - Cervical Six;  Surgeon: Kary Kos, MD;  Location: [facility];  Service: Neurosurgery;  Laterality: N/A;  Anteroir Cervical Decrompression Fusion - Cervical Three - Cervical Four - Cervical Four - Cervical Five - Cervical Five - Cervical Six   BREAST BIOPSY     right breast/benign   CERCLAGE REMOVAL  1986   WHEN pregnant   CESAREAN SECTION  1986   1 time   COLONOSCOPY     CYSTOSCOPY  10/17/2011   Procedure: CYSTOSCOPY;  Surgeon: Megan Salon, MD;  Location: [facility];   Service: Gynecology;;   DILATION AND CURETTAGE OF UTERUS     ESOPHAGOGASTRODUODENOSCOPY     TONSILLECTOMY     TOTAL HIP ARTHROPLASTY Right 08/05/2019   Procedure: RIGHT TOTAL HIP ARTHROPLASTY ANTERIOR APPROACH;  Surgeon: Mcarthur Rossetti, MD;  Location: [facility];  Service: Orthopedics;  Laterality: Right;    There were no vitals filed for this visit.    Subjective Assessment - 08/01/21 0805     Subjective Patient reports that she is having pain in the Rt hand and forearm which has been there for about a month. She has been having pain and tightness in the scapular area for a couple of months. MRI showed some irritation around the level of cervical fusion. She is scheduled for [facility] 08/25/21 and returns to see Dr Saintclair Halsted after injection.    Pertinent History ACDF 3 levels 04/06/18 received PT for 12 visits following surgery; arthritis; DDD C6-7; headaches; reflux    Patient Stated Goals increased strength and decrease pain in Rt UE to return to more normal functional activities    Currently in Pain? Yes    Pain Score 7     Pain Location Shoulder    Pain Orientation Right    Pain Descriptors / Indicators Aching    Pain Type Acute pain    Pain Radiating Towards shoulder  to elbow to forearm    Pain Onset More than a month ago    Pain Frequency Intermittent    Aggravating Factors  using Rt UE for any activities    Pain Relieving Factors rest; meds help some                [facility] PT Assessment - 08/01/21 0001       Assessment   Medical Diagnosis Cervical radiculopathy    Referring Provider (PT) Dr Kary Kos    Onset Date/Surgical Date 05/24/21    Hand Dominance Right    Next MD Visit 08/30/21    Prior Therapy here post cervical fusion      Precautions   Precautions None      Balance Screen   Has the patient fallen in the past 6 months No    Has the patient had a decrease in activity level because of a fear of falling?  No    Is the patient reluctant to leave their home because  of a fear of falling?  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    Leisure household chores; walking dog ~ 20 min 1-2 times/day      Observation/Other Assessments   Focus on Therapeutic Outcomes (FOTO)  44      Sensation   Additional Comments numbness in the little and ring fingers into palm on Rt hand; weakness in Rt UE      Posture/Postural Control   Posture Comments head forward; shoulders rounded and elevated; head of the humerus anterior in orientation; scapulae abducted and rotated along the thoracic wall      AROM   Right Shoulder Flexion 163 Degrees    Right Shoulder ABduction 165 Degrees    Left Shoulder Flexion 165 Degrees    Left Shoulder ABduction 165 Degrees    Cervical Flexion 46    Cervical Extension 55    Cervical - Right Side Bend 29    Cervical - Left Side Bend 30    Cervical - Right Rotation 62    Cervical - Left Rotation 58      Strength   Right Shoulder Flexion 5/5    Right Shoulder ABduction 5/5    Right Shoulder External Rotation 5/5    Left Shoulder Flexion 5/5    Left Shoulder ABduction 5/5    Left Shoulder External Rotation 5/5    Right Hand Grip (lbs) 44    Right Hand Lateral Pinch 2.5 lbs    Left Hand Grip (lbs) 54    Left Hand Lateral Pinch 7.5 lbs      Palpation   Spinal mobility decreased spinal mobility through the cervical and thoracic spine    Palpation comment muscular tightness noted through the ant/lat/post cervical musculature; pecs; upper trap; leveator                        Objective measurements completed on examination: See above findings.       [facility] Adult PT Treatment/Exercise - 08/01/21 0001       Neuro Re-ed    Neuro Re-ed Details   working on posture and alignment through cervical  and thoracic spine      Shoulder Exercises: Standing   Other Standing Exercises chin tuck 10 sec x 5; scap squeeze 10 sec x 5 reps; L's x 10; W's x 10 with noodle along spine      Shoulder Exercises: Stretch   Other Shoulder Stretches doorway stretch 30 sec x 2 reps each position                     PT Education - 08/01/21 0839     Education Details POC HEP    Person(s) Educated Patient    Methods Explanation;Demonstration;Tactile cues;Verbal cues;Handout    Comprehension Verbalized understanding;Returned demonstration;Verbal cues required;Tactile cues required                 PT Long Term Goals - 08/01/21 0927       PT LONG TERM GOAL #1   Title independent with HEP    Time 6    Period Weeks    Status New    Target Date 09/12/21      PT LONG TERM GOAL #2   Title verbalize and demonstrate proper posture with posterior shoulder girdle engaged to decrease risk of recurrent cervical problems    Baseline -    Time 6    Period Weeks    Status New    Target Date 09/12/21      PT LONG TERM GOAL #3   Title demonstrate increased grip strength by 5 pounds and lateral pinch by 3 pounds    Baseline -    Time 6    Period Weeks    Status New    Target Date 09/12/21      PT LONG TERM GOAL #4   Title Improve functional limitation score to 59    Time 6    Period Weeks    Status New    Target Date 09/12/21                    Plan - 08/01/21 0840     Clinical Impression Statement Patient presents with ~ 2 month history of Rt upper quarter pain and weakness. She has poor posture and alignment; limited cervical and end range shoulder ROM; UE weakness in grip and pinch; muscular tightness to palpation. Patient has a history of 3 level ACDF 04/06/18. She is scheduled for [facility] 08/25/21. Patient will benefit from PT to address problems identified.    Stability/Clinical Decision Making Stable/Uncomplicated     Clinical Decision Making Low    Rehab Potential Good    PT Frequency 2x / week    PT Duration 6 weeks    PT Treatment/Interventions ADLs/Self Care Home Management;Aquatic Therapy;Cryotherapy;Electrical Stimulation;Iontophoresis 4mg /ml Dexamethasone;Moist Heat;Ultrasound;Therapeutic activities;Therapeutic exercise;Neuromuscular re-education;Patient/family education;Manual techniques;Dry needling;Taping    PT Next Visit Plan review and progress HEP; continue with postural correction/education; posterior shoulder girdle strengthening    PT Home Exercise Plan [email or web address]    Consulted and Agree with Plan of Care Patient             Patient will benefit from skilled therapeutic intervention in order to improve the following deficits and impairments:  Decreased range of motion, Increased fascial restricitons, Decreased activity tolerance, Pain, Impaired flexibility, Improper body mechanics, Decreased mobility, Decreased strength, Impaired sensation, Postural dysfunction  Visit Diagnosis: Cervicalgia - Plan: PT plan of care cert/re-cert  Other symptoms and signs involving the musculoskeletal system - Plan: PT plan of care cert/re-cert  Muscle weakness (generalized) - Plan: PT plan  of care cert/re-cert  Abnormal posture - Plan: PT plan of care cert/re-cert     Problem List Patient Active Problem List   Diagnosis Date Noted   Status post total replacement of right hip 08/05/2019   Unilateral primary osteoarthritis, right hip 06/23/2019   Nonallopathic lesion of sacral region 06/13/2019   Nonallopathic lesion of lumbosacral region 06/13/2019   Nonallopathic lesion of thoracic region 06/13/2019   Right hip pain 06/13/2019   Left knee pain 01/17/2019   Spinal stenosis in cervical region 03/27/2018   Testosterone deficiency 08/22/2017   Hyperlipidemia 08/22/2017   Chronic lower back pain 08/22/2017   Cervical myelopathy ([place]) 05/15/2016   Vitamin D deficiency 05/15/2016   Prediabetes  05/14/2016   Fatty liver 03/09/2016   Positive TB test 05/28/2012   GERD (gastroesophageal reflux disease) 11/21/2010   Dyshidrotic eczema 11/21/2010    Amyjo Mizrachi Nilda Simmer, PT, MPH  08/01/2021, 9:33 AM  [facility] [place], [place], [address] Phone: [phone]   Fax:  [phone]  Name: Sarah Mullen MRN: [id number] Date of Birth: 1956/02/08

## 2021-08-01 NOTE — Progress Notes (Signed)
Error

## 2021-08-04 ENCOUNTER — Other Ambulatory Visit: Payer: Self-pay

## 2021-08-04 ENCOUNTER — Other Ambulatory Visit (HOSPITAL_COMMUNITY)
Admission: RE | Admit: 2021-08-04 | Discharge: 2021-08-04 | Disposition: A | Discharge status: Home / Self Care | Payer: No Typology Code available for payment source | Source: Ambulatory Visit | Attending: Obstetrics & Gynecology | Admitting: Obstetrics & Gynecology

## 2021-08-04 ENCOUNTER — Encounter (HOSPITAL_BASED_OUTPATIENT_CLINIC_OR_DEPARTMENT_OTHER): Payer: Self-pay | Admitting: Obstetrics & Gynecology

## 2021-08-04 ENCOUNTER — Ambulatory Visit: Payer: No Typology Code available for payment source | Admitting: Sports Medicine

## 2021-08-04 ENCOUNTER — Encounter: Payer: Self-pay | Admitting: Rehabilitative and Restorative Service Providers"

## 2021-08-04 ENCOUNTER — Ambulatory Visit: Payer: No Typology Code available for payment source | Admitting: Rehabilitative and Restorative Service Providers"

## 2021-08-04 ENCOUNTER — Ambulatory Visit (INDEPENDENT_AMBULATORY_CARE_PROVIDER_SITE_OTHER): Payer: No Typology Code available for payment source | Admitting: Obstetrics & Gynecology

## 2021-08-04 VITALS — BP 149/80 | HR 110 | Wt 176.4 lb

## 2021-08-04 DIAGNOSIS — Z01419 Encounter for gynecological examination (general) (routine) without abnormal findings: Secondary | ICD-10-CM | POA: Diagnosis not present

## 2021-08-04 DIAGNOSIS — Z90710 Acquired absence of both cervix and uterus: Secondary | ICD-10-CM

## 2021-08-04 DIAGNOSIS — M6281 Muscle weakness (generalized): Secondary | ICD-10-CM

## 2021-08-04 DIAGNOSIS — M542 Cervicalgia: Secondary | ICD-10-CM

## 2021-08-04 DIAGNOSIS — R29898 Other symptoms and signs involving the musculoskeletal system: Secondary | ICD-10-CM

## 2021-08-04 DIAGNOSIS — R293 Abnormal posture: Secondary | ICD-10-CM

## 2021-08-04 DIAGNOSIS — Z79890 Hormone replacement therapy (postmenopausal): Secondary | ICD-10-CM

## 2021-08-04 DIAGNOSIS — K760 Fatty (change of) liver, not elsewhere classified: Secondary | ICD-10-CM

## 2021-08-04 DIAGNOSIS — E559 Vitamin D deficiency, unspecified: Secondary | ICD-10-CM

## 2021-08-04 DIAGNOSIS — N898 Other specified noninflammatory disorders of vagina: Secondary | ICD-10-CM | POA: Insufficient documentation

## 2021-08-04 NOTE — Progress Notes (Signed)
66 y.o. G6P1 Single White or Caucasian female here for annual exam.  Doing well.  Denies vaginal bleeding.  Having lot of issues with her neck and right arm pain.  Is on leave from work.  Having an injection scheduled.  Started PT.  Patient's last menstrual period was 09/24/2011.          Sexually active: Yes.    The current method of family planning is status post hysterectomy.    Exercising: No.   Smoker:  no  Health Maintenance: Pap:  prior to hysterectomy  History of abnormal Pap:  no MMG:  05/2021 Colonoscopy:  2019, follow up 10 years BMD:   scheduled tomorrow Screening Labs: Dr. Quay Burow 06/2021   reports that she quit smoking about 19 years ago. Her smoking use included cigarettes. She has never used smokeless tobacco. She reports that she does not drink alcohol and does not use drugs.  Past Medical History:  Diagnosis Date   Allergic rhinitis, cause unspecified    Blood transfusion    iron transfusion- [place]-2009   Chronic headaches    Depression    Diverticulitis    GERD (gastroesophageal reflux disease)    Heart murmur    diag with echo-physiologic murmur; trace MR, trivial, early systolic MR by 38/10/17 echo ([facility])   History of sinus tachycardia    07/04/07 Holter monitor: 69-154, average HR 95 bpm. Occ PACs, 4 runs probable atrial tachycardia   Menometrorrhagia 11/21/2010   Migraines    Pre-diabetes     Past Surgical History:  Procedure Laterality Date   ABDOMINAL HYSTERECTOMY  09/2011   ANTERIOR CERVICAL DECOMP/DISCECTOMY FUSION N/A 03/27/2018   Procedure: Anteroir Cervical Decrompression Fusion - Cervical Three - Cervical Four - Cervical Four - Cervical Five - Cervical Five - Cervical Six;  Surgeon: Kary Kos, MD;  Location: [place];  Service: Neurosurgery;  Laterality: N/A;  Anteroir Cervical Decrompression Fusion - Cervical Three - Cervical Four - Cervical Four - Cervical Five - Cervical Five - Cervical Six   BREAST BIOPSY     right  breast/benign   CERCLAGE REMOVAL  1986   WHEN pregnant   CESAREAN SECTION  1986   1 time   COLONOSCOPY     CYSTOSCOPY  10/17/2011   Procedure: CYSTOSCOPY;  Surgeon: Megan Salon, MD;  Location: [facility];  Service: Gynecology;;   DILATION AND CURETTAGE OF UTERUS     ESOPHAGOGASTRODUODENOSCOPY     TONSILLECTOMY     TOTAL HIP ARTHROPLASTY Right 08/05/2019   Procedure: RIGHT TOTAL HIP ARTHROPLASTY ANTERIOR APPROACH;  Surgeon: Mcarthur Rossetti, MD;  Location: [place];  Service: Orthopedics;  Laterality: Right;    Current Outpatient Medications  Medication Sig Dispense Refill   aspirin 81 MG chewable tablet Chew 1 tablet (81 mg total) by mouth 2 (two) times daily. 30 tablet 0   aspirin-acetaminophen-caffeine (EXCEDRIN MIGRAINE) 250-250-65 MG tablet Take 2 tablets by mouth daily as needed for headache or migraine.     cetirizine (ZYRTEC) 10 MG tablet Take 10 mg by mouth daily.     DULoxetine (CYMBALTA) 60 MG capsule Take 1 capsule (60 mg total) by mouth daily. 90 capsule 1   ibuprofen (ADVIL) 800 MG tablet Take 800 mg by mouth every 8 (eight) hours as needed.     meloxicam (MOBIC) 15 MG tablet Take 1 tablet (15 mg total) by mouth daily. 40 tablet 0   NONFORMULARY OR COMPOUNDED ITEM Estradiol cream 0.02% with testosterone 1% cream, Apply  1/2 ml vaginally as directed.  #45 ml grams 1 each 1   oxyCODONE 10 MG TABS Take 1 tablet (10 mg total) by mouth every 6 (six) hours as needed for up to 30 doses for severe pain. 30 tablet 0   pantoprazole (PROTONIX) 40 MG tablet Take 1 tablet (40 mg total) by mouth daily. 90 tablet 1   triamcinolone cream (KENALOG) 0.1 % Apply 1 application topically 2 (two) times daily. 30 g 2   Vitamin D, Ergocalciferol, (DRISDOL) 1.25 MG (50000 UNIT) CAPS capsule Take 1 capsule (50,000 Units total) by mouth every 7 (seven) days. 8 capsule 0   No current facility-administered medications for this visit.    Family History  Problem Relation Age of Onset   Arthritis  Mother    Hyperlipidemia Mother    Heart disease Mother    Arthritis Father    Lung cancer Father        smoker   Heart disease Father    Hypertension Father    Diabetes Father    Colon cancer Paternal Aunt    CAD Paternal Grandmother    Kidney disease Maternal Grandfather        ESRD on HD   Kidney disease Maternal Grandmother        ESRD on HD   Diabetes Brother    Metabolic syndrome Sister    Stomach cancer Neg Hx    Rectal cancer Neg Hx    Esophageal cancer Neg Hx     Review of Systems  All other systems reviewed and are negative.  Exam:   BP (!) 149/80 (BP Location: Right Arm, Patient Position: Sitting, Cuff Size: Large)    Pulse (!) 110    Wt 176 lb 6.4 oz (80 kg)    LMP 09/24/2011    BMI 33.33 kg/m      General appearance: alert, cooperative and appears stated age Head: Normocephalic, without obvious abnormality, atraumatic Neck: no adenopathy, supple, symmetrical, trachea midline and thyroid normal to inspection and palpation Lungs: clear to auscultation bilaterally Breasts: normal appearance, no masses or tenderness Heart: regular rate and rhythm Abdomen: soft, non-tender; bowel sounds normal; no masses,  no organomegaly Extremities: extremities normal, atraumatic, no cyanosis or edema Skin: Skin color, texture, turgor normal. No rashes or lesions Lymph nodes: Cervical, supraclavicular, and axillary nodes normal. No abnormal inguinal nodes palpated Neurologic: Grossly normal   Pelvic: External genitalia:  erythema of inner labia majora with skin fissuring              Urethra:  normal appearing urethra with no masses, tenderness or lesions              Bartholins and Skenes: normal                 Vagina: normal appearing vagina with normal color and watery discharge present, no lesions              Cervix: absent              Pap taken: No. Bimanual Exam:  Uterus:  uterus absent              Adnexa: normal adnexa               Rectovaginal: Confirms                Anus:  normal sphincter tone, no lesions  Chaperone, Octaviano Batty, CMA, was present for exam.  Assessment/Plan: 1. Well woman exam with  routine gynecological exam - pap not indicated - MMG 05/2021 - colonoscopy 2019 - BMD is scheduled - vaccines reviewed/updated - lab work done 06/2021  2. H/O: hysterectomy - 2013  3. Vitamin D deficiency  4. Fatty liver  5. Hormone replacement therapy - does not need RF for estradiol/testosterone cream  7.  Vaginal discharge/erythema of tissue - ancillary swab obtained for yeast and BV

## 2021-08-04 NOTE — Patient Instructions (Signed)
Access Code: [email or web address] URL: [email or web address] Date: 08/04/2021 Prepared by: Gillermo Murdoch  Exercises Seated Cervical Retraction - 3 x daily - 7 x weekly - 1 sets - 10 reps Standing Scapular Retraction - 3 x daily - 7 x weekly - 1 sets - 10 reps - 10 hold Shoulder External Rotation and Scapular Retraction - 3 x daily - 7 x weekly - 1 sets - 10 reps - hold Shoulder External Rotation in 45 Degrees Abduction - 2 x daily - 7 x weekly - 1-2 sets - 10 reps - 3 sec hold Doorway Pec Stretch at 60 Degrees Abduction - 3 x daily - 7 x weekly - 1 sets - 3 reps Doorway Pec Stretch at 90 Degrees Abduction - 3 x daily - 7 x weekly - 1 sets - 3 reps - 30 seconds hold Doorway Pec Stretch at 120 Degrees Abduction - 3 x daily - 7 x weekly - 1 sets - 3 reps - 30 second hold hold Supine Thoracic Mobilization Towel Roll Vertical with Arm Stretch - 2 x daily - 7 x weekly - 1 sets - 1-3 reps - 2-3 min hold Shoulder External Rotation and Scapular Retraction with Resistance - 2 x daily - 7 x weekly - 1 sets - 10 reps - 3-5 sec hold Standing Bilateral Low Shoulder Row with Anchored Resistance - 2 x daily - 7 x weekly - 1-3 sets - 10 reps - 2-3 sec hold Drawing Bow - 1 x daily - 7 x weekly - 1 sets - 10 reps - 3 sec hold

## 2021-08-04 NOTE — Therapy (Signed)
[place] [address] [place], [place], [address] Phone: [phone]   Fax:  [phone]  Physical Therapy Treatment  Patient Details  Name: Sarah Mullen MRN: [id number] Date of Birth: 18-Mar-1956 Referring Provider (PT): Dr Kary Kos   Encounter Date: 08/04/2021   PT End of Session - 08/04/21 0807     Visit Number 2    Number of Visits 12    Date for PT Re-Evaluation 09/12/21    PT Start Time 0806    PT Stop Time 0856    PT Time Calculation (min) 50 min    Activity Tolerance Patient tolerated treatment well             Past Medical History:  Diagnosis Date   Allergic rhinitis, cause unspecified    Blood transfusion    iron transfusion- [place]-2009   Chronic headaches    Depression    Diverticulitis    GERD (gastroesophageal reflux disease)    Heart murmur    diag with echo-physiologic murmur; trace MR, trivial, early systolic MR by 76/22/63 echo ([facility])   History of sinus tachycardia    07/04/07 Holter monitor: 69-154, average HR 95 bpm. Occ PACs, 4 runs probable atrial tachycardia   Menometrorrhagia 11/21/2010   Migraines    Pre-diabetes     Past Surgical History:  Procedure Laterality Date   ABDOMINAL HYSTERECTOMY  09/2011   ANTERIOR CERVICAL DECOMP/DISCECTOMY FUSION N/A 03/27/2018   Procedure: Anteroir Cervical Decrompression Fusion - Cervical Three - Cervical Four - Cervical Four - Cervical Five - Cervical Five - Cervical Six;  Surgeon: Kary Kos, MD;  Location: [place];  Service: Neurosurgery;  Laterality: N/A;  Anteroir Cervical Decrompression Fusion - Cervical Three - Cervical Four - Cervical Four - Cervical Five - Cervical Five - Cervical Six   BREAST BIOPSY     right breast/benign   CERCLAGE REMOVAL  1986   WHEN pregnant   CESAREAN SECTION  1986   1 time   COLONOSCOPY     CYSTOSCOPY  10/17/2011   Procedure: CYSTOSCOPY;  Surgeon: Megan Salon, MD;  Location: [facility];   Service: Gynecology;;   DILATION AND CURETTAGE OF UTERUS     ESOPHAGOGASTRODUODENOSCOPY     TONSILLECTOMY     TOTAL HIP ARTHROPLASTY Right 08/05/2019   Procedure: RIGHT TOTAL HIP ARTHROPLASTY ANTERIOR APPROACH;  Surgeon: Mcarthur Rossetti, MD;  Location: [facility];  Service: Orthopedics;  Laterality: Right;    There were no vitals filed for this visit.   Subjective Assessment - 08/04/21 0808     Subjective Patient reports that she does not feel as tight since starting exercises. Still having the numbness and weakness in the Rt forearm and hand.    Currently in Pain? No/denies    Pain Score 0-No pain                               [facility] Adult PT Treatment/Exercise - 08/04/21 0001       Shoulder Exercises: Standing   Row Strengthening;Both;10 reps;Theraband    Theraband Level (Shoulder Row) Level 3 (Green)    Row Limitations bow and arrow x 10 reps each side green TB    Retraction Strengthening;Both;10 reps;Theraband    Theraband Level (Shoulder Retraction) Level 2 (Red)    Other Standing Exercises chin tuck 10 sec x 5; scap squeeze 10 sec x 5 reps; L's x 10;  W's x 10 with noodle along spine      Shoulder Exercises: Stretch   Other Shoulder Stretches doorway stretch 30 sec x 2 reps each position    Other Shoulder Stretches prolonged snow angel on noodle ~ 2 min      Moist Heat Therapy   Number Minutes Moist Heat 10 Minutes    Moist Heat Location Cervical;Shoulder      Electrical Stimulation   Electrical Stimulation Location bilat posterior cervical and upper trap areas    Electrical Stimulation Action TENS    Electrical Stimulation Parameters to tolerance    Electrical Stimulation Goals Tone      Manual Therapy   Manual therapy comments pt supine    Joint Mobilization Grade I/II PA and lateral mobs upper cervical (fused C4/5/6)    Soft tissue mobilization deep tissue work through the ant/lat/post cervical musculature; upper trap; pecs inot Rt  arm/forearm/hand    Myofascial Release anterior chest/pecs    Manual Traction cervical 10-15 sec x 3 reps                     PT Education - 08/04/21 4259     Education Details HEP    Person(s) Educated Patient    Methods Explanation;Demonstration;Tactile cues;Verbal cues;Handout    Comprehension Verbalized understanding;Returned demonstration;Verbal cues required;Tactile cues required                 PT Long Term Goals - 08/01/21 0927       PT LONG TERM GOAL #1   Title independent with HEP    Time 6    Period Weeks    Status New    Target Date 09/12/21      PT LONG TERM GOAL #2   Title verbalize and demonstrate proper posture with posterior shoulder girdle engaged to decrease risk of recurrent cervical problems    Baseline -    Time 6    Period Weeks    Status New    Target Date 09/12/21      PT LONG TERM GOAL #3   Title demonstrate increased grip strength by 5 pounds and lateral pinch by 3 pounds    Baseline -    Time 6    Period Weeks    Status New    Target Date 09/12/21      PT LONG TERM GOAL #4   Title Improve functional limitation score to 59    Time 6    Period Weeks    Status New    Target Date 09/12/21                   Plan - 08/04/21 5638     Clinical Impression Statement Decreaesd pain and some improvement in tissue extensibility. Working on posture and alignment; reviewed exercises and added posterior shoulder girdle strengthening and prolonged snow angel stretch for anterior chest. Trial of manual work and TENS unit to address tightness.    Rehab Potential Good    PT Frequency 2x / week    PT Duration 6 weeks    PT Treatment/Interventions ADLs/Self Care Home Management;Aquatic Therapy;Cryotherapy;Electrical Stimulation;Iontophoresis 4mg /ml Dexamethasone;Moist Heat;Ultrasound;Therapeutic activities;Therapeutic exercise;Neuromuscular re-education;Patient/family education;Manual techniques;Dry needling;Taping    PT Next  Visit Plan review and progress HEP; continue with postural correction/education; posterior shoulder girdle strengthening; assess response to manual work and TENS - consider trial of DN    PT Home Exercise Plan [email or web address]    Consulted and Agree with Plan of Care Patient  Patient will benefit from skilled therapeutic intervention in order to improve the following deficits and impairments:     Visit Diagnosis: Cervicalgia  Other symptoms and signs involving the musculoskeletal system  Muscle weakness (generalized)  Abnormal posture     Problem List Patient Active Problem List   Diagnosis Date Noted   Status post total replacement of right hip 08/05/2019   Unilateral primary osteoarthritis, right hip 06/23/2019   Nonallopathic lesion of sacral region 06/13/2019   Nonallopathic lesion of lumbosacral region 06/13/2019   Nonallopathic lesion of thoracic region 06/13/2019   Right hip pain 06/13/2019   Left knee pain 01/17/2019   Spinal stenosis in cervical region 03/27/2018   Testosterone deficiency 08/22/2017   Hyperlipidemia 08/22/2017   Chronic lower back pain 08/22/2017   Cervical myelopathy ([place]) 05/15/2016   Vitamin D deficiency 05/15/2016   Prediabetes 05/14/2016   Fatty liver 03/09/2016   Positive TB test 05/28/2012   GERD (gastroesophageal reflux disease) 11/21/2010   Dyshidrotic eczema 11/21/2010    Denys Salinger Nilda Simmer, PT, MPH 08/04/2021, 9:02 AM  [facility] [address] [place], [place], [address] Phone: [phone]   Fax:  [phone]  Name: Sarah Mullen MRN: [id number] Date of Birth: July 31, 1955

## 2021-08-05 ENCOUNTER — Encounter (HOSPITAL_BASED_OUTPATIENT_CLINIC_OR_DEPARTMENT_OTHER): Payer: Self-pay | Admitting: Obstetrics & Gynecology

## 2021-08-05 ENCOUNTER — Other Ambulatory Visit (HOSPITAL_COMMUNITY): Payer: Self-pay

## 2021-08-05 LAB — CERVICOVAGINAL ANCILLARY ONLY
Bacterial Vaginitis (gardnerella): NEGATIVE
Candida Glabrata: NEGATIVE
Candida Vaginitis: NEGATIVE
Comment: NEGATIVE
Comment: NEGATIVE
Comment: NEGATIVE

## 2021-08-05 LAB — HM DEXA SCAN
HM Dexa Scan: NORMAL
HM Dexa Scan: NORMAL

## 2021-08-08 ENCOUNTER — Encounter: Payer: Self-pay | Admitting: Rehabilitative and Restorative Service Providers"

## 2021-08-08 ENCOUNTER — Ambulatory Visit: Payer: No Typology Code available for payment source | Admitting: Rehabilitative and Restorative Service Providers"

## 2021-08-08 ENCOUNTER — Other Ambulatory Visit: Payer: Self-pay

## 2021-08-08 DIAGNOSIS — M6281 Muscle weakness (generalized): Secondary | ICD-10-CM

## 2021-08-08 DIAGNOSIS — R29898 Other symptoms and signs involving the musculoskeletal system: Secondary | ICD-10-CM

## 2021-08-08 DIAGNOSIS — R293 Abnormal posture: Secondary | ICD-10-CM

## 2021-08-08 DIAGNOSIS — M542 Cervicalgia: Secondary | ICD-10-CM

## 2021-08-08 NOTE — Patient Instructions (Signed)
Access Code: [email or web address] URL: [email or web address] Date: 08/08/2021 Prepared by: Gillermo Murdoch  Exercises Seated Cervical Retraction - 3 x daily - 7 x weekly - 1 sets - 10 reps Standing Scapular Retraction - 3 x daily - 7 x weekly - 1 sets - 10 reps - 10 hold Shoulder External Rotation and Scapular Retraction - 3 x daily - 7 x weekly - 1 sets - 10 reps - hold Shoulder External Rotation in 45 Degrees Abduction - 2 x daily - 7 x weekly - 1-2 sets - 10 reps - 3 sec hold Doorway Pec Stretch at 60 Degrees Abduction - 3 x daily - 7 x weekly - 1 sets - 3 reps Doorway Pec Stretch at 90 Degrees Abduction - 3 x daily - 7 x weekly - 1 sets - 3 reps - 30 seconds hold Doorway Pec Stretch at 120 Degrees Abduction - 3 x daily - 7 x weekly - 1 sets - 3 reps - 30 second hold hold Supine Thoracic Mobilization Towel Roll Vertical with Arm Stretch - 2 x daily - 7 x weekly - 1 sets - 1-3 reps - 2-3 min hold Shoulder External Rotation and Scapular Retraction with Resistance - 2 x daily - 7 x weekly - 1 sets - 10 reps - 3-5 sec hold Standing Bilateral Low Shoulder Row with Anchored Resistance - 2 x daily - 7 x weekly - 1-3 sets - 10 reps - 2-3 sec hold Drawing Bow - 1 x daily - 7 x weekly - 1 sets - 10 reps - 3 sec hold Gripping Sponge Neutral - 2 x daily - 7 x weekly - 1 sets - 10-20 reps - 5-10 sec hold

## 2021-08-08 NOTE — Therapy (Signed)
[place], [place], [address] Phone: [phone]   Fax:  [phone]  Physical Therapy Treatment  Patient Details  Name: Sarah Mullen MRN: [id number] Date of Birth: 07/02/1956 Referring Provider (PT): Dr Kary Kos   Encounter Date: 08/08/2021   PT End of Session - 08/08/21 0811     Visit Number 3    Number of Visits 12    Date for PT Re-Evaluation 09/12/21    PT Start Time 0810   pt late for appt   PT Stop Time 0850    PT Time Calculation (min) 40 min    Activity Tolerance Patient tolerated treatment well             Past Medical History:  Diagnosis Date   Allergic rhinitis, cause unspecified    Blood transfusion    iron transfusion- [place]-2009   Chronic headaches    Depression    Diverticulitis    GERD (gastroesophageal reflux disease)    Heart murmur    diag with echo-physiologic murmur; trace MR, trivial, early systolic MR by 16/01/09 echo ([facility])   History of sinus tachycardia    07/04/07 Holter monitor: 69-154, average HR 95 bpm. Occ PACs, 4 runs probable atrial tachycardia   Menometrorrhagia 11/21/2010   Migraines    Pre-diabetes     Past Surgical History:  Procedure Laterality Date   ABDOMINAL HYSTERECTOMY  09/2011   ANTERIOR CERVICAL DECOMP/DISCECTOMY FUSION N/A 03/27/2018   Procedure: Anteroir Cervical Decrompression Fusion - Cervical Three - Cervical Four - Cervical Four - Cervical Five - Cervical Five - Cervical Six;  Surgeon: Kary Kos, MD;  Location: [facility];  Service: Neurosurgery;  Laterality: N/A;  Anteroir Cervical Decrompression Fusion - Cervical Three - Cervical Four - Cervical Four - Cervical Five - Cervical Five - Cervical Six   BREAST BIOPSY     right breast/benign   CERCLAGE REMOVAL  1986   WHEN pregnant   CESAREAN SECTION  1986   1 time   COLONOSCOPY     CYSTOSCOPY  10/17/2011   Procedure: CYSTOSCOPY;  Surgeon: Megan Salon, MD;   Location: [facility];  Service: Gynecology;;   DILATION AND CURETTAGE OF UTERUS     ESOPHAGOGASTRODUODENOSCOPY     TONSILLECTOMY     TOTAL HIP ARTHROPLASTY Right 08/05/2019   Procedure: RIGHT TOTAL HIP ARTHROPLASTY ANTERIOR APPROACH;  Surgeon: Mcarthur Rossetti, MD;  Location: [place];  Service: Orthopedics;  Laterality: Right;    There were no vitals filed for this visit.   Subjective Assessment - 08/08/21 0812     Subjective Pain is much better. Still having some aching and the numbness in the Rt hand, not really in her forearm as much. Still notices the weakness in her hand.    Currently in Pain? No/denies    Pain Score 0-No pain    Pain Location Shoulder    Pain Orientation Right    Pain Descriptors / Indicators Aching                [facility] PT Assessment - 08/08/21 0001       Assessment   Medical Diagnosis Cervical radiculopathy    Referring Provider (PT) Dr Kary Kos    Onset Date/Surgical Date 05/24/21    Hand Dominance Right    Next MD Visit 08/30/21    Prior Therapy here post cervical fusion      Palpation   Palpation comment muscular tightness  noted through the ant/lat/post cervical musculature; pecs; upper trap; leveator      Special Tests   Other special tests (+) neural tensio test Rt UE                           [facility] Adult PT Treatment/Exercise - 08/08/21 0001       Exercises   Exercises Other Exercises    Other Exercises  grip with squeeze star 1-2 min x 2 reps - issued gripper for home use      Shoulder Exercises: Standing   Row Strengthening;Both;10 reps;Theraband    Theraband Level (Shoulder Row) Level 3 (Green)    Row Limitations bow and arrow x 10 reps each side green TB    Retraction Strengthening;Both;10 reps;Theraband    Theraband Level (Shoulder Retraction) Level 2 (Red)    Other Standing Exercises chin tuck 10 sec x 5; scap squeeze 10 sec x 5 reps; L's x 10; W's x 10 with noodle along spine      Shoulder Exercises:  Stretch   Other Shoulder Stretches doorway stretch 30 sec x 2 reps each position    Other Shoulder Stretches neural mobilization 1 min x 2 reps supine; prolonged snow angel on noodle ~ 2 min      Moist Heat Therapy   Number Minutes Moist Heat 10 Minutes    Moist Heat Location Cervical;Shoulder      Manual Therapy   Manual therapy comments pt supine    Joint Mobilization Grade I/II PA and lateral mobs upper cervical (fused C4/5/6)    Soft tissue mobilization deep tissue work through the ant/lat/post cervical musculature; upper trap; pecs inot Rt arm/forearm/hand    Myofascial Release anterior chest/pecs    Manual Traction cervical 10-15 sec x 3 reps                     PT Education - 08/08/21 0837     Education Details HEP    Person(s) Educated Patient    Methods Explanation;Demonstration;Tactile cues;Verbal cues;Handout    Comprehension Verbalized understanding;Returned demonstration;Verbal cues required;Tactile cues required                 PT Long Term Goals - 08/01/21 0927       PT LONG TERM GOAL #1   Title independent with HEP    Time 6    Period Weeks    Status New    Target Date 09/12/21      PT LONG TERM GOAL #2   Title verbalize and demonstrate proper posture with posterior shoulder girdle engaged to decrease risk of recurrent cervical problems    Baseline -    Time 6    Period Weeks    Status New    Target Date 09/12/21      PT LONG TERM GOAL #3   Title demonstrate increased grip strength by 5 pounds and lateral pinch by 3 pounds    Baseline -    Time 6    Period Weeks    Status New    Target Date 09/12/21      PT LONG TERM GOAL #4   Title Improve functional limitation score to 59    Time 6    Period Weeks    Status New    Target Date 09/12/21                   Plan - 08/08/21 3295  Clinical Impression Statement Continued improvement in pain. Numbness and weakness persists. Continues to work on [employer]. Tightness in the  cervical musculature continues. Tight Rt scaleni; traps. Added neural mobilization in supine    Rehab Potential Good    PT Frequency 2x / week    PT Duration 6 weeks    PT Treatment/Interventions ADLs/Self Care Home Management;Aquatic Therapy;Cryotherapy;Electrical Stimulation;Iontophoresis 4mg /ml Dexamethasone;Moist Heat;Ultrasound;Therapeutic activities;Therapeutic exercise;Neuromuscular re-education;Patient/family education;Manual techniques;Dry needling;Taping    PT Next Visit Plan review and progress HEP; continue with postural correction/education; posterior shoulder girdle strengthening; assess response to manual work and TENS - consider trial of DN at next [place] and Agree with Plan of Care Patient             Patient will benefit from skilled therapeutic intervention in order to improve the following deficits and impairments:     Visit Diagnosis: Cervicalgia  Other symptoms and signs involving the musculoskeletal system  Muscle weakness (generalized)  Abnormal posture     Problem List Patient Active Problem List   Diagnosis Date Noted   Status post total replacement of right hip 08/05/2019   Unilateral primary osteoarthritis, right hip 06/23/2019   Nonallopathic lesion of sacral region 06/13/2019   Nonallopathic lesion of lumbosacral region 06/13/2019   Nonallopathic lesion of thoracic region 06/13/2019   Right hip pain 06/13/2019   Left knee pain 01/17/2019   Spinal stenosis in cervical region 03/27/2018   Testosterone deficiency 08/22/2017   Hyperlipidemia 08/22/2017   Chronic lower back pain 08/22/2017   Cervical myelopathy ([place]) 05/15/2016   Vitamin D deficiency 05/15/2016   Prediabetes 05/14/2016   Fatty liver 03/09/2016   Positive TB test 05/28/2012   GERD (gastroesophageal reflux disease) 11/21/2010   Dyshidrotic eczema 11/21/2010    Electa Sterry Nilda Simmer, PT, MPH  08/08/2021, 8:39 AM  [facility] [place], [place], [address] Phone: [phone]   Fax:  [phone]  Name: CHADE PITNER MRN: [id number] Date of Birth: 01/10/1956

## 2021-08-11 ENCOUNTER — Ambulatory Visit: Payer: No Typology Code available for payment source | Admitting: Rehabilitative and Restorative Service Providers"

## 2021-08-11 ENCOUNTER — Other Ambulatory Visit: Payer: Self-pay

## 2021-08-11 ENCOUNTER — Encounter: Payer: Self-pay | Admitting: Rehabilitative and Restorative Service Providers"

## 2021-08-11 DIAGNOSIS — M542 Cervicalgia: Secondary | ICD-10-CM

## 2021-08-11 DIAGNOSIS — R29898 Other symptoms and signs involving the musculoskeletal system: Secondary | ICD-10-CM

## 2021-08-11 DIAGNOSIS — R293 Abnormal posture: Secondary | ICD-10-CM

## 2021-08-11 DIAGNOSIS — M6281 Muscle weakness (generalized): Secondary | ICD-10-CM

## 2021-08-11 NOTE — Therapy (Signed)
[facility] [place], [place], [address] Phone: [phone]   Fax:  [phone]  Physical Therapy Treatment  Patient Details  Name: Sarah Mullen MRN: [id number] Date of Birth: Apr 16, 1956 Referring Provider (PT): Dr Kary Kos   Encounter Date: 08/11/2021   PT End of Session - 08/11/21 0809     Visit Number 4    Number of Visits 12    Date for PT Re-Evaluation 09/12/21    PT Start Time 0804    PT Stop Time 0852    PT Time Calculation (min) 48 min    Activity Tolerance Patient tolerated treatment well             Past Medical History:  Diagnosis Date   Allergic rhinitis, cause unspecified    Blood transfusion    iron transfusion- [place]-2009   Chronic headaches    Depression    Diverticulitis    GERD (gastroesophageal reflux disease)    Heart murmur    diag with echo-physiologic murmur; trace MR, trivial, early systolic MR by 68/11/57 echo ([facility])   History of sinus tachycardia    07/04/07 Holter monitor: 69-154, average HR 95 bpm. Occ PACs, 4 runs probable atrial tachycardia   Menometrorrhagia 11/21/2010   Migraines    Pre-diabetes     Past Surgical History:  Procedure Laterality Date   ABDOMINAL HYSTERECTOMY  09/2011   ANTERIOR CERVICAL DECOMP/DISCECTOMY FUSION N/A 03/27/2018   Procedure: Anteroir Cervical Decrompression Fusion - Cervical Three - Cervical Four - Cervical Four - Cervical Five - Cervical Five - Cervical Six;  Surgeon: Kary Kos, MD;  Location: [facility];  Service: Neurosurgery;  Laterality: N/A;  Anteroir Cervical Decrompression Fusion - Cervical Three - Cervical Four - Cervical Four - Cervical Five - Cervical Five - Cervical Six   BREAST BIOPSY     right breast/benign   CERCLAGE REMOVAL  1986   WHEN pregnant   CESAREAN SECTION  1986   1 time   COLONOSCOPY     CYSTOSCOPY  10/17/2011   Procedure: CYSTOSCOPY;  Surgeon: Megan Salon, MD;  Location: [facility];   Service: Gynecology;;   DILATION AND CURETTAGE OF UTERUS     ESOPHAGOGASTRODUODENOSCOPY     TONSILLECTOMY     TOTAL HIP ARTHROPLASTY Right 08/05/2019   Procedure: RIGHT TOTAL HIP ARTHROPLASTY ANTERIOR APPROACH;  Surgeon: Mcarthur Rossetti, MD;  Location: [facility];  Service: Orthopedics;  Laterality: Right;    There were no vitals filed for this visit.   Subjective Assessment - 08/11/21 0810     Subjective No pain. Has continued numbness in Rt hand which is there all the time. Working on her exercises. Sleep is still "off". Sleeps during the day sometimes. Stays up late at night.    Currently in Pain? No/denies    Pain Score 0-No pain                               [facility] Adult PT Treatment/Exercise - 08/11/21 0001       Neck Exercises: Seated   Lateral Flexion Right;Left;5 reps    Lateral Flexion Limitations 5-10 sec hold      Shoulder Exercises: Standing   External Rotation Strengthening;Right;Left;10 reps;Theraband    Theraband Level (Shoulder External Rotation) Level 3 (Green)    Row Strengthening;Both;10 reps;Theraband    Theraband Level (Shoulder Row) Level 3 (Green)    Row  Limitations bow and arrow x 10 reps each side green TB    Retraction Strengthening;Both;10 reps;Theraband    Theraband Level (Shoulder Retraction) Level 2 (Red)    Other Standing Exercises chin tuck 10 sec x 5; scap squeeze 10 sec x 5 reps; L's x 10; W's x 10 with noodle along spine      Shoulder Exercises: Stretch   Other Shoulder Stretches doorway stretch 30 sec x 2 reps each position    Other Shoulder Stretches neural mobilization 1 min x 2 reps supine; prolonged snow angel on noodle ~ 2 min      Moist Heat Therapy   Number Minutes Moist Heat 10 Minutes    Moist Heat Location Cervical;Shoulder      Manual Therapy   Manual therapy comments skilled palpation to assess response to DN and manual work    Joint Mobilization Grade I/II PA and lateral mobs upper cervical (fused  C4/5/6)    Soft tissue mobilization deep tissue work through the ant/lat/post cervical musculature; upper trap; pecs inot Rt arm/forearm/hand    Myofascial Release anterior chest/pecs    Manual Traction cervical 10-15 sec x 3 reps              Trigger Point Dry Needling - 08/11/21 0001     Consent Given? Yes    Education Handout Provided Yes    Muscles Treated Head and Neck Upper trapezius;Scalenes;Splenius capitus;Semispinalis capitus;Cervical multifidi    Scalenes Response Palpable increased muscle length;Twitch reponse elicited                   PT Education - 08/11/21 0817     Education Details HEP    Person(s) Educated Patient    Methods Explanation;Demonstration;Tactile cues;Verbal cues;Handout    Comprehension Verbalized understanding;Returned demonstration;Verbal cues required;Tactile cues required                 PT Long Term Goals - 08/01/21 0927       PT LONG TERM GOAL #1   Title independent with HEP    Time 6    Period Weeks    Status New    Target Date 09/12/21      PT LONG TERM GOAL #2   Title verbalize and demonstrate proper posture with posterior shoulder girdle engaged to decrease risk of recurrent cervical problems    Baseline -    Time 6    Period Weeks    Status New    Target Date 09/12/21      PT LONG TERM GOAL #3   Title demonstrate increased grip strength by 5 pounds and lateral pinch by 3 pounds    Baseline -    Time 6    Period Weeks    Status New    Target Date 09/12/21      PT LONG TERM GOAL #4   Title Improve functional limitation score to 59    Time 6    Period Weeks    Status New    Target Date 09/12/21                   Plan - 08/11/21 0811     Clinical Impression Statement Persistent tightness and numbness. Minimal to no pain. Some soreness after exercises. Tight Rt scaleni, traps, Tolerated DN and manual work well.    Rehab Potential Good    PT Frequency 2x / week    PT Duration 6 weeks     PT Treatment/Interventions ADLs/Self Care  Home Management;Aquatic Therapy;Cryotherapy;Electrical Stimulation;Iontophoresis 4mg /ml Dexamethasone;Moist Heat;Ultrasound;Therapeutic activities;Therapeutic exercise;Neuromuscular re-education;Patient/family education;Manual techniques;Dry needling;Taping    PT Next Visit Plan review and progress HEP; continue with postural correction/education; posterior shoulder girdle strengthening; assess response to manual work and TENS - assess response to DN    PT Home Exercise Plan [email or web address]    Consulted and Agree with Plan of Care Patient             Patient will benefit from skilled therapeutic intervention in order to improve the following deficits and impairments:     Visit Diagnosis: Cervicalgia  Other symptoms and signs involving the musculoskeletal system  Muscle weakness (generalized)  Abnormal posture     Problem List Patient Active Problem List   Diagnosis Date Noted   Status post total replacement of right hip 08/05/2019   Unilateral primary osteoarthritis, right hip 06/23/2019   Nonallopathic lesion of sacral region 06/13/2019   Nonallopathic lesion of lumbosacral region 06/13/2019   Nonallopathic lesion of thoracic region 06/13/2019   Right hip pain 06/13/2019   Left knee pain 01/17/2019   Spinal stenosis in cervical region 03/27/2018   Testosterone deficiency 08/22/2017   Hyperlipidemia 08/22/2017   Chronic lower back pain 08/22/2017   Cervical myelopathy ([place]) 05/15/2016   Vitamin D deficiency 05/15/2016   Prediabetes 05/14/2016   Fatty liver 03/09/2016   Positive TB test 05/28/2012   GERD (gastroesophageal reflux disease) 11/21/2010   Dyshidrotic eczema 11/21/2010    Carlee Vonderhaar Nilda Simmer, PT, MPH  08/11/2021, 8:40 AM  [facility] [place], [place], [address] Phone: [phone]   Fax:  [phone]  Name: Sarah Mullen MRN: [id number] Date of Birth:  May 25, 1956

## 2021-08-11 NOTE — Patient Instructions (Addendum)
Trigger Point Dry Needling  What is Trigger Point Dry Needling (DN)? DN is a physical therapy technique used to treat muscle pain and dysfunction. Specifically, DN helps deactivate muscle trigger points (muscle knots).  A thin filiform needle is used to penetrate the skin and stimulate the underlying trigger point. The goal is for a local twitch response (LTR) to occur and for the trigger point to relax. No medication of any kind is injected during the procedure.   What Does Trigger Point Dry Needling Feel Like?  The procedure feels different for each individual patient. Some patients report that they do not actually feel the needle enter the skin and overall the process is not painful. Very mild bleeding may occur. However, many patients feel a deep cramping in the muscle in which the needle was inserted. This is the local twitch response.   How Will I feel after the treatment? Soreness is normal, and the onset of soreness may not occur for a few hours. Typically this soreness does not last longer than two days.  Bruising is uncommon, however; ice can be used to decrease any possible bruising.  In rare cases feeling tired or nauseous after the treatment is normal. In addition, your symptoms may get worse before they get better, this period will typically not last longer than 24 hours.   What Can I do After My Treatment? Increase your hydration by drinking more water for the next 24 hours. You may place ice or heat on the areas treated that have become sore, however, do not use heat on inflamed or bruised areas. Heat often brings more relief post needling. You can continue your regular activities, but vigorous activity is not recommended initially after the treatment for 24 hours. DN is best combined with other physical therapy such as strengthening, stretching, and other therapies.     Access Code: [email or web address] URL: [email or web address] Date: 08/11/2021 Prepared by: Gillermo Murdoch  Exercises Seated Cervical Retraction - 3 x daily - 7 x weekly - 1 sets - 10 reps Standing Scapular Retraction - 3 x daily - 7 x weekly - 1 sets - 10 reps - 10 hold Shoulder External Rotation and Scapular Retraction - 3 x daily - 7 x weekly - 1 sets - 10 reps - hold Shoulder External Rotation in 45 Degrees Abduction - 2 x daily - 7 x weekly - 1-2 sets - 10 reps - 3 sec hold Doorway Pec Stretch at 60 Degrees Abduction - 3 x daily - 7 x weekly - 1 sets - 3 reps Doorway Pec Stretch at 90 Degrees Abduction - 3 x daily - 7 x weekly - 1 sets - 3 reps - 30 seconds hold Doorway Pec Stretch at 120 Degrees Abduction - 3 x daily - 7 x weekly - 1 sets - 3 reps - 30 second hold hold Supine Thoracic Mobilization Towel Roll Vertical with Arm Stretch - 2 x daily - 7 x weekly - 1 sets - 1-3 reps - 2-3 min hold Shoulder External Rotation and Scapular Retraction with Resistance - 2 x daily - 7 x weekly - 1 sets - 10 reps - 3-5 sec hold Standing Bilateral Low Shoulder Row with Anchored Resistance - 2 x daily - 7 x weekly - 1-3 sets - 10 reps - 2-3 sec hold Drawing Bow - 1 x daily - 7 x weekly - 1 sets - 10 reps - 3 sec hold Gripping Sponge Neutral - 2 x daily - 7 x weekly - 1  sets - 10-20 reps - 5-10 sec hold Shoulder External Rotation Reactive Isometrics - 2 x daily - 7 x weekly - 1 sets - 10 reps - 3 sec hold Seated Cervical Sidebending AROM - 2 x daily - 7 x weekly - 1 sets - 5 reps - 5-10 sec hold

## 2021-08-15 ENCOUNTER — Encounter: Payer: Self-pay | Admitting: Rehabilitative and Restorative Service Providers"

## 2021-08-15 ENCOUNTER — Other Ambulatory Visit: Payer: Self-pay

## 2021-08-15 ENCOUNTER — Ambulatory Visit: Payer: No Typology Code available for payment source | Admitting: Rehabilitative and Restorative Service Providers"

## 2021-08-15 DIAGNOSIS — M542 Cervicalgia: Secondary | ICD-10-CM

## 2021-08-15 DIAGNOSIS — R29898 Other symptoms and signs involving the musculoskeletal system: Secondary | ICD-10-CM

## 2021-08-15 DIAGNOSIS — M6281 Muscle weakness (generalized): Secondary | ICD-10-CM

## 2021-08-15 DIAGNOSIS — R293 Abnormal posture: Secondary | ICD-10-CM

## 2021-08-15 NOTE — Therapy (Signed)
[place], [place], [address] Phone: [phone]   Fax:  [phone]  Physical Therapy Treatment  Patient Details  Name: Sarah Mullen MRN: [id number] Date of Birth: 1955-12-29 Referring Provider (PT): Dr Kary Kos   Encounter Date: 08/15/2021   PT End of Session - 08/15/21 0842     Visit Number 5    Number of Visits 12    Date for PT Re-Evaluation 09/12/21    PT Start Time 0807   pt late for appt   PT Stop Time 0852    PT Time Calculation (min) 45 min    Activity Tolerance Patient tolerated treatment well             Past Medical History:  Diagnosis Date   Allergic rhinitis, cause unspecified    Blood transfusion    iron transfusion- [place]-2009   Chronic headaches    Depression    Diverticulitis    GERD (gastroesophageal reflux disease)    Heart murmur    diag with echo-physiologic murmur; trace MR, trivial, early systolic MR by 61/60/73 echo ([facility])   History of sinus tachycardia    07/04/07 Holter monitor: 69-154, average HR 95 bpm. Occ PACs, 4 runs probable atrial tachycardia   Menometrorrhagia 11/21/2010   Migraines    Pre-diabetes     Past Surgical History:  Procedure Laterality Date   ABDOMINAL HYSTERECTOMY  09/2011   ANTERIOR CERVICAL DECOMP/DISCECTOMY FUSION N/A 03/27/2018   Procedure: Anteroir Cervical Decrompression Fusion - Cervical Three - Cervical Four - Cervical Four - Cervical Five - Cervical Five - Cervical Six;  Surgeon: Kary Kos, MD;  Location: [facility];  Service: Neurosurgery;  Laterality: N/A;  Anteroir Cervical Decrompression Fusion - Cervical Three - Cervical Four - Cervical Four - Cervical Five - Cervical Five - Cervical Six   BREAST BIOPSY     right breast/benign   CERCLAGE REMOVAL  1986   WHEN pregnant   CESAREAN SECTION  1986   1 time   COLONOSCOPY     CYSTOSCOPY  10/17/2011   Procedure: CYSTOSCOPY;  Surgeon: Megan Salon, MD;   Location: [facility];  Service: Gynecology;;   DILATION AND CURETTAGE OF UTERUS     ESOPHAGOGASTRODUODENOSCOPY     TONSILLECTOMY     TOTAL HIP ARTHROPLASTY Right 08/05/2019   Procedure: RIGHT TOTAL HIP ARTHROPLASTY ANTERIOR APPROACH;  Surgeon: Mcarthur Rossetti, MD;  Location: [facility];  Service: Orthopedics;  Laterality: Right;    There were no vitals filed for this visit.   Subjective Assessment - 08/15/21 0808     Subjective No pain. Patient reports that she had good results with DN. Feels she is making progress with grip strength.    Currently in Pain? No/denies                [facility] PT Assessment - 08/15/21 0001       Assessment   Medical Diagnosis Cervical radiculopathy    Referring Provider (PT) Dr Kary Kos    Onset Date/Surgical Date 05/24/21    Hand Dominance Right    Next MD Visit 08/30/21    Prior Therapy here post cervical fusion      Strength   Right Hand Grip (lbs) 48    Right Hand Lateral Pinch 5 lbs    Left Hand Grip (lbs) 54    Left Hand Lateral Pinch 11 lbs      Special Tests   Other  special tests (+) neural tension test Rt UE                           [facility] Adult PT Treatment/Exercise - 08/15/21 0001       Neck Exercises: Seated   Lateral Flexion Right;Left;5 reps    Lateral Flexion Limitations 5-10 sec hold      Shoulder Exercises: Standing   External Rotation Strengthening;Right;Left;10 reps;Theraband    Theraband Level (Shoulder External Rotation) Level 3 (Green)    Row Strengthening;Both;10 reps;Theraband    Theraband Level (Shoulder Row) Level 3 (Green)    Row Limitations bow and arrow x 10 reps each side green TB    Retraction Strengthening;Both;10 reps;Theraband    Theraband Level (Shoulder Retraction) Level 2 (Red)    Other Standing Exercises chin tuck 10 sec x 5; scap squeeze 10 sec x 5 reps; L's x 10; W's x 10 with noodle along spine      Shoulder Exercises: Stretch   Other Shoulder Stretches doorway stretch 30 sec  x 2 reps each position    Other Shoulder Stretches neural mobilization 1 min x 2 reps supine; prolonged snow angel on noodle ~ 2 min      Moist Heat Therapy   Number Minutes Moist Heat 10 Minutes    Moist Heat Location Cervical;Shoulder      Manual Therapy   Manual therapy comments skilled palpation to assess response to DN and manual work    Joint Mobilization Grade I/II PA and lateral mobs upper cervical (fused C4/5/6)    Soft tissue mobilization deep tissue work through the ant/lat/post cervical musculature; upper trap; pecs inot Rt arm/forearm/hand    Myofascial Release anterior chest/pecs    Manual Traction cervical 10-15 sec x 3 reps              Trigger Point Dry Needling - 08/15/21 0001     Education Handout Provided Previously provided    Upper Trapezius Response Palpable increased muscle length    Scalenes Response Palpable increased muscle length                        PT Long Term Goals - 08/01/21 0927       PT LONG TERM GOAL #1   Title independent with HEP    Time 6    Period Weeks    Status New    Target Date 09/12/21      PT LONG TERM GOAL #2   Title verbalize and demonstrate proper posture with posterior shoulder girdle engaged to decrease risk of recurrent cervical problems    Baseline -    Time 6    Period Weeks    Status New    Target Date 09/12/21      PT LONG TERM GOAL #3   Title demonstrate increased grip strength by 5 pounds and lateral pinch by 3 pounds    Baseline -    Time 6    Period Weeks    Status New    Target Date 09/12/21      PT LONG TERM GOAL #4   Title Improve functional limitation score to 59    Time 6    Period Weeks    Status New    Target Date 09/12/21                   Plan - 08/15/21 0809     Clinical  Impression Statement No pain. Persistent tightness and numbness into the Rt UE. Tightness noted in the lateral and deep cervical musculature. Increasing grip and pinch strength noted. Good  response to to DN and manual work.    Rehab Potential Good    PT Frequency 2x / week    PT Duration 6 weeks    PT Treatment/Interventions ADLs/Self Care Home Management;Aquatic Therapy;Cryotherapy;Electrical Stimulation;Iontophoresis 4mg /ml Dexamethasone;Moist Heat;Ultrasound;Therapeutic activities;Therapeutic exercise;Neuromuscular re-education;Patient/family education;Manual techniques;Dry needling;Taping    PT Next Visit Plan review and progress HEP; continue with postural correction/education; posterior shoulder girdle strengthening; assess response to manual work and TENS -  continue DN and manual work    PT [place] and Agree with Plan of Care Patient             Patient will benefit from skilled therapeutic intervention in order to improve the following deficits and impairments:     Visit Diagnosis: Cervicalgia  Other symptoms and signs involving the musculoskeletal system  Muscle weakness (generalized)  Abnormal posture     Problem List Patient Active Problem List   Diagnosis Date Noted   Status post total replacement of right hip 08/05/2019   Unilateral primary osteoarthritis, right hip 06/23/2019   Nonallopathic lesion of sacral region 06/13/2019   Nonallopathic lesion of lumbosacral region 06/13/2019   Nonallopathic lesion of thoracic region 06/13/2019   Right hip pain 06/13/2019   Left knee pain 01/17/2019   Spinal stenosis in cervical region 03/27/2018   Testosterone deficiency 08/22/2017   Hyperlipidemia 08/22/2017   Chronic lower back pain 08/22/2017   Cervical myelopathy ([place]) 05/15/2016   Vitamin D deficiency 05/15/2016   Prediabetes 05/14/2016   Fatty liver 03/09/2016   Positive TB test 05/28/2012   GERD (gastroesophageal reflux disease) 11/21/2010   Dyshidrotic eczema 11/21/2010    Sarah Mullen, PT, MPH  08/15/2021, 8:44 AM  [facility] [place], [place], [address] Phone: [phone]   Fax:  [phone]  Name: AVELINE DAUS MRN: [id number] Date of Birth: 1956-07-16

## 2021-08-16 ENCOUNTER — Encounter: Payer: Self-pay | Admitting: Internal Medicine

## 2021-08-16 DIAGNOSIS — E119 Type 2 diabetes mellitus without complications: Secondary | ICD-10-CM | POA: Insufficient documentation

## 2021-08-16 MED ORDER — RYBELSUS 3 MG PO TABS: 3.0000 mg | ORAL_TABLET | Freq: Every day | ORAL | 0 refills | Status: DC

## 2021-08-17 ENCOUNTER — Other Ambulatory Visit: Payer: Self-pay

## 2021-08-17 ENCOUNTER — Other Ambulatory Visit: Payer: Self-pay | Admitting: Internal Medicine

## 2021-08-17 MED ORDER — RYBELSUS 3 MG PO TABS: 3.0000 mg | ORAL_TABLET | Freq: Every day | ORAL | 0 refills | Status: DC

## 2021-08-18 ENCOUNTER — Ambulatory Visit: Payer: No Typology Code available for payment source | Admitting: Rehabilitative and Restorative Service Providers"

## 2021-08-18 ENCOUNTER — Encounter: Payer: Self-pay | Admitting: Rehabilitative and Restorative Service Providers"

## 2021-08-18 DIAGNOSIS — R29898 Other symptoms and signs involving the musculoskeletal system: Secondary | ICD-10-CM

## 2021-08-18 DIAGNOSIS — M542 Cervicalgia: Secondary | ICD-10-CM

## 2021-08-18 DIAGNOSIS — R293 Abnormal posture: Secondary | ICD-10-CM

## 2021-08-18 DIAGNOSIS — M6281 Muscle weakness (generalized): Secondary | ICD-10-CM

## 2021-08-18 NOTE — Patient Instructions (Signed)
Access Code: [email or web address] URL: [email or web address] Date: 08/18/2021 Prepared by: Gillermo Murdoch  Exercises Seated Cervical Retraction - 3 x daily - 7 x weekly - 1 sets - 10 reps Standing Scapular Retraction - 3 x daily - 7 x weekly - 1 sets - 10 reps - 10 hold Shoulder External Rotation and Scapular Retraction - 3 x daily - 7 x weekly - 1 sets - 10 reps - hold Shoulder External Rotation in 45 Degrees Abduction - 2 x daily - 7 x weekly - 1-2 sets - 10 reps - 3 sec hold Doorway Pec Stretch at 60 Degrees Abduction - 3 x daily - 7 x weekly - 1 sets - 3 reps Doorway Pec Stretch at 90 Degrees Abduction - 3 x daily - 7 x weekly - 1 sets - 3 reps - 30 seconds hold Doorway Pec Stretch at 120 Degrees Abduction - 3 x daily - 7 x weekly - 1 sets - 3 reps - 30 second hold hold Supine Thoracic Mobilization Towel Roll Vertical with Arm Stretch - 2 x daily - 7 x weekly - 1 sets - 1-3 reps - 2-3 min hold Shoulder External Rotation and Scapular Retraction with Resistance - 2 x daily - 7 x weekly - 1 sets - 10 reps - 3-5 sec hold Standing Bilateral Low Shoulder Row with Anchored Resistance - 2 x daily - 7 x weekly - 1-3 sets - 10 reps - 2-3 sec hold Drawing Bow - 1 x daily - 7 x weekly - 1 sets - 10 reps - 3 sec hold Gripping Sponge Neutral - 2 x daily - 7 x weekly - 1 sets - 10-20 reps - 5-10 sec hold Shoulder External Rotation Reactive Isometrics - 2 x daily - 7 x weekly - 1 sets - 10 reps - 3 sec hold Seated Cervical Sidebending AROM - 2 x daily - 7 x weekly - 1 sets - 5 reps - 5-10 sec hold Standing Lat Pull Down with Resistance - Elbows Bent - 2 x daily - 7 x weekly - 1 sets - 10 reps - 3 sec hold

## 2021-08-18 NOTE — Therapy (Signed)
[place], [place], [address] Phone: [phone]   Fax:  [phone]  Physical Therapy Treatment  Patient Details  Name: Sarah Mullen MRN: [id number] Date of Birth: 11-22-1955 Referring Provider (PT): Dr Kary Kos   Encounter Date: 08/18/2021   PT End of Session - 08/18/21 0801     Visit Number 6    Number of Visits 12    Date for PT Re-Evaluation 09/12/21    PT Start Time 0800    PT Stop Time 0848    PT Time Calculation (min) 48 min    Activity Tolerance Patient tolerated treatment well             Past Medical History:  Diagnosis Date   Allergic rhinitis, cause unspecified    Blood transfusion    iron transfusion- [place]-2009   Chronic headaches    Depression    Diverticulitis    GERD (gastroesophageal reflux disease)    Heart murmur    diag with echo-physiologic murmur; trace MR, trivial, early systolic MR by 01/16/93 echo ([facility])   History of sinus tachycardia    07/04/07 Holter monitor: 69-154, average HR 95 bpm. Occ PACs, 4 runs probable atrial tachycardia   Menometrorrhagia 11/21/2010   Migraines    Pre-diabetes     Past Surgical History:  Procedure Laterality Date   ABDOMINAL HYSTERECTOMY  09/2011   ANTERIOR CERVICAL DECOMP/DISCECTOMY FUSION N/A 03/27/2018   Procedure: Anteroir Cervical Decrompression Fusion - Cervical Three - Cervical Four - Cervical Four - Cervical Five - Cervical Five - Cervical Six;  Surgeon: Kary Kos, MD;  Location: [facility] [place];  Service: Neurosurgery;  Laterality: N/A;  Anteroir Cervical Decrompression Fusion - Cervical Three - Cervical Four - Cervical Four - Cervical Five - Cervical Five - Cervical Six   BREAST BIOPSY     right breast/benign   CERCLAGE REMOVAL  1986   WHEN pregnant   CESAREAN SECTION  1986   1 time   COLONOSCOPY     CYSTOSCOPY  10/17/2011   Procedure: CYSTOSCOPY;  Surgeon: Megan Salon, MD;  Location: [facility];   Service: Gynecology;;   DILATION AND CURETTAGE OF UTERUS     ESOPHAGOGASTRODUODENOSCOPY     TONSILLECTOMY     TOTAL HIP ARTHROPLASTY Right 08/05/2019   Procedure: RIGHT TOTAL HIP ARTHROPLASTY ANTERIOR APPROACH;  Surgeon: Mcarthur Rossetti, MD;  Location: [facility];  Service: Orthopedics;  Laterality: Right;    There were no vitals filed for this visit.   Subjective Assessment - 08/18/21 0801     Subjective Getting better. Fewer radicular symptoms. Grip seems to be improving.    Currently in Pain? No/denies    Pain Score 0-No pain    Pain Location Shoulder    Pain Orientation Right                               [facility] Adult PT Treatment/Exercise - 08/18/21 0001       Neck Exercises: Seated   Lateral Flexion Right;Left;5 reps    Lateral Flexion Limitations 5-10 sec hold      Shoulder Exercises: Prone   Retraction AROM;Strengthening;Both;5 reps    Retraction Limitations axial extension with scap squeeze      Shoulder Exercises: Standing   External Rotation Strengthening;Right;Left;10 reps;Theraband    Theraband Level (Shoulder External Rotation) Level 3 (Green)    Extension Strengthening;Both;10 reps;Theraband  Theraband Level (Shoulder Extension) Level 4 (Blue)    Extension Limitations lat pull    Row Strengthening;Both;10 reps;Theraband    Theraband Level (Shoulder Row) Level 4 (Blue)    Row Limitations bow and arrow x 10 reps each side green TB    Retraction Strengthening;Both;10 reps;Theraband    Theraband Level (Shoulder Retraction) Level 2 (Red)    Other Standing Exercises chin tuck 10 sec x 5; scap squeeze 10 sec x 5 reps; L's x 10; W's x 10 with noodle along spine      Shoulder Exercises: Stretch   Other Shoulder Stretches doorway stretch 30 sec x 2 reps each position      Moist Heat Therapy   Number Minutes Moist Heat 10 Minutes    Moist Heat Location Cervical;Shoulder      Manual Therapy   Manual therapy comments skilled palpation to  assess response to DN and manual work    Joint Mobilization Grade II/III PA and lateral mobs upper cervical (fused C4/5/6)    Soft tissue mobilization deep tissue work through the ant/lat/post cervical musculature; upper trap; pecs inot Rt arm/forearm/hand    Myofascial Release upper traps    Manual Traction cervical 10-15 sec x 3 reps              Trigger Point Dry Needling - 08/18/21 0001     Consent Given? Yes    Education Handout Provided Previously provided    Upper Trapezius Response Palpable increased muscle length    Scalenes Response Palpable increased muscle length                   PT Education - 08/18/21 0812     Education Details HEP    Person(s) Educated Patient    Methods Explanation;Demonstration;Tactile cues;Verbal cues;Handout    Comprehension Verbalized understanding;Returned demonstration;Verbal cues required;Tactile cues required                 PT Long Term Goals - 08/01/21 0927       PT LONG TERM GOAL #1   Title independent with HEP    Time 6    Period Weeks    Status New    Target Date 09/12/21      PT LONG TERM GOAL #2   Title verbalize and demonstrate proper posture with posterior shoulder girdle engaged to decrease risk of recurrent cervical problems    Baseline -    Time 6    Period Weeks    Status New    Target Date 09/12/21      PT LONG TERM GOAL #3   Title demonstrate increased grip strength by 5 pounds and lateral pinch by 3 pounds    Baseline -    Time 6    Period Weeks    Status New    Target Date 09/12/21      PT LONG TERM GOAL #4   Title Improve functional limitation score to 59    Time 6    Period Weeks    Status New    Target Date 09/12/21                   Plan - 08/18/21 0805     Clinical Impression Statement Improving - with less radicular c/o's and improving functional grip. Persistent tightness noted through the Rt cervical musculature. Coninuting with postural strengthening.     Rehab Potential Good    PT Frequency 2x / week    PT Duration 6  weeks    PT Treatment/Interventions ADLs/Self Care Home Management;Aquatic Therapy;Cryotherapy;Electrical Stimulation;Iontophoresis 4mg /ml Dexamethasone;Moist Heat;Ultrasound;Therapeutic activities;Therapeutic exercise;Neuromuscular re-education;Patient/family education;Manual techniques;Dry needling;Taping    PT Next Visit Plan review and progress HEP; continue with postural correction/education; posterior shoulder girdle strengthening; continue DN and manual work; modalities as indicated    PT Home Exercise Plan [place] and Agree with Plan of Care Patient             Patient will benefit from skilled therapeutic intervention in order to improve the following deficits and impairments:     Visit Diagnosis: Cervicalgia  Other symptoms and signs involving the musculoskeletal system  Muscle weakness (generalized)  Abnormal posture     Problem List Patient Active Problem List   Diagnosis Date Noted   Diabetes ([place]) 08/16/2021   Status post total replacement of right hip 08/05/2019   Unilateral primary osteoarthritis, right hip 06/23/2019   Nonallopathic lesion of sacral region 06/13/2019   Nonallopathic lesion of lumbosacral region 06/13/2019   Nonallopathic lesion of thoracic region 06/13/2019   Right hip pain 06/13/2019   Left knee pain 01/17/2019   Spinal stenosis in cervical region 03/27/2018   Testosterone deficiency 08/22/2017   Hyperlipidemia 08/22/2017   Chronic lower back pain 08/22/2017   Cervical myelopathy ([place]) 05/15/2016   Vitamin D deficiency 05/15/2016   Fatty liver 03/09/2016   Positive TB test 05/28/2012   GERD (gastroesophageal reflux disease) 11/21/2010   Dyshidrotic eczema 11/21/2010    Kary Sugrue Nilda Simmer, PT, MPH  08/18/2021, 8:42 AM  [facility] [place], [place], [address] Phone: [phone]   Fax:   [phone]  Name: SHAWNDELL VARAS MRN: [id number] Date of Birth: 07/01/1956

## 2021-08-19 NOTE — Telephone Encounter (Signed)
Medication refilled 08/17/21.

## 2021-08-22 ENCOUNTER — Encounter: Payer: Self-pay | Admitting: Rehabilitative and Restorative Service Providers"

## 2021-08-22 ENCOUNTER — Other Ambulatory Visit: Payer: Self-pay

## 2021-08-22 ENCOUNTER — Other Ambulatory Visit: Payer: Self-pay | Admitting: Internal Medicine

## 2021-08-22 ENCOUNTER — Ambulatory Visit: Payer: No Typology Code available for payment source | Admitting: Rehabilitative and Restorative Service Providers"

## 2021-08-22 DIAGNOSIS — M6281 Muscle weakness (generalized): Secondary | ICD-10-CM

## 2021-08-22 DIAGNOSIS — R29898 Other symptoms and signs involving the musculoskeletal system: Secondary | ICD-10-CM

## 2021-08-22 DIAGNOSIS — R293 Abnormal posture: Secondary | ICD-10-CM

## 2021-08-22 DIAGNOSIS — M542 Cervicalgia: Secondary | ICD-10-CM | POA: Diagnosis not present

## 2021-08-22 NOTE — Therapy (Signed)
[place], [place], [address] Phone: [phone]   Fax:  [phone]  Physical Therapy Treatment  Patient Details  Name: Sarah Mullen MRN: [id number] Date of Birth: June 08, 1956 Referring Provider (PT): Dr Kary Kos   Encounter Date: 08/22/2021   PT End of Session - 08/22/21 0805     Visit Number 7    Number of Visits 12    Date for PT Re-Evaluation 09/12/21    PT Start Time 0805    PT Stop Time 0853    PT Time Calculation (min) 48 min    Activity Tolerance Patient tolerated treatment well             Past Medical History:  Diagnosis Date   Allergic rhinitis, cause unspecified    Blood transfusion    iron transfusion- [place]-2009   Chronic headaches    Depression    Diverticulitis    GERD (gastroesophageal reflux disease)    Heart murmur    diag with echo-physiologic murmur; trace MR, trivial, early systolic MR by 57/26/20 echo ([facility])   History of sinus tachycardia    07/04/07 Holter monitor: 69-154, average HR 95 bpm. Occ PACs, 4 runs probable atrial tachycardia   Menometrorrhagia 11/21/2010   Migraines    Pre-diabetes     Past Surgical History:  Procedure Laterality Date   ABDOMINAL HYSTERECTOMY  09/2011   ANTERIOR CERVICAL DECOMP/DISCECTOMY FUSION N/A 03/27/2018   Procedure: Anteroir Cervical Decrompression Fusion - Cervical Three - Cervical Four - Cervical Four - Cervical Five - Cervical Five - Cervical Six;  Surgeon: Kary Kos, MD;  Location: [facility];  Service: Neurosurgery;  Laterality: N/A;  Anteroir Cervical Decrompression Fusion - Cervical Three - Cervical Four - Cervical Four - Cervical Five - Cervical Five - Cervical Six   BREAST BIOPSY     right breast/benign   CERCLAGE REMOVAL  1986   WHEN pregnant   CESAREAN SECTION  1986   1 time   COLONOSCOPY     CYSTOSCOPY  10/17/2011   Procedure: CYSTOSCOPY;  Surgeon: Megan Salon, MD;  Location: [facility];   Service: Gynecology;;   DILATION AND CURETTAGE OF UTERUS     ESOPHAGOGASTRODUODENOSCOPY     TONSILLECTOMY     TOTAL HIP ARTHROPLASTY Right 08/05/2019   Procedure: RIGHT TOTAL HIP ARTHROPLASTY ANTERIOR APPROACH;  Surgeon: Mcarthur Rossetti, MD;  Location: [facility];  Service: Orthopedics;  Laterality: Right;    There were no vitals filed for this visit.   Subjective Assessment - 08/22/21 0806     Subjective Numbness continues in the Rt UE on an intermittent basis. Schedulced for [facility] 08/25/[facility]. Exercises are going well. No neck pain.    Currently in Pain? No/denies    Pain Score 0-No pain    Pain Location Shoulder    Pain Orientation Right    Pain Descriptors / Indicators Numbness    Pain Type Acute pain                [facility] PT Assessment - 08/22/21 0001       Assessment   Medical Diagnosis Cervical radiculopathy    Referring Provider (PT) Dr Kary Kos    Onset Date/Surgical Date 05/24/21    Hand Dominance Right    Next MD Visit 08/30/21    Prior Therapy here post cervical fusion      Palpation   Palpation comment muscular tightness noted through Rt > Lt ant/lat/post  cervical musculature; pecs; upper trap; leveator                           [facility] Adult PT Treatment/Exercise - 08/22/21 0001       Self-Care   Lifting hinged hip deadlift 15#KB from 8 in stool x 10 reps      Therapeutic Activites    Therapeutic Activities Lifting    Lifting bent row 10# KB x 10 each side      Neck Exercises: Seated   Lateral Flexion Right;Left;5 reps    Lateral Flexion Limitations 5-10 sec hold      Shoulder Exercises: Standing   External Rotation Strengthening;Right;Left;10 reps;Theraband    Theraband Level (Shoulder External Rotation) Level 3 (Green)    Extension Strengthening;Both;10 reps;Theraband    Theraband Level (Shoulder Extension) Level 4 (Blue)    Extension Limitations lat pull    Row Strengthening;Both;10 reps;Theraband    Theraband Level (Shoulder  Row) Level 4 (Blue)    Row Limitations bow and arrow x 10 reps each side green TB    Retraction Strengthening;Both;10 reps;Theraband    Theraband Level (Shoulder Retraction) Level 2 (Red)      Shoulder Exercises: Stretch   Other Shoulder Stretches doorway stretch 30 sec x 2 reps each position      Manual Therapy   Manual therapy comments skilled palpation to assess response to DN and manual work    Joint Mobilization Grade II/III PA and lateral mobs upper cervical (fused C4/5/6)    Soft tissue mobilization deep tissue work through the ant/lat/post cervical musculature; upper trap; pecs inot Rt arm/forearm/hand    Myofascial Release upper traps    Passive ROM cervical stretch into flexion; flexion with slight rotation/lateral flexion; lateral flexion    Manual Traction cervical 10-15 sec x 3 reps              Trigger Point Dry Needling - 08/22/21 0001     Consent Given? Yes    Education Handout Provided Previously provided    Upper Trapezius Response Palpable increased muscle length    Scalenes Response Palpable increased muscle length    Cervical multifidi Response Palpable increased muscle length                        PT Long Term Goals - 08/01/21 0927       PT LONG TERM GOAL #1   Title independent with HEP    Time 6    Period Weeks    Status New    Target Date 09/12/21      PT LONG TERM GOAL #2   Title verbalize and demonstrate proper posture with posterior shoulder girdle engaged to decrease risk of recurrent cervical problems    Baseline -    Time 6    Period Weeks    Status New    Target Date 09/12/21      PT LONG TERM GOAL #3   Title demonstrate increased grip strength by 5 pounds and lateral pinch by 3 pounds    Baseline -    Time 6    Period Weeks    Status New    Target Date 09/12/21      PT LONG TERM GOAL #4   Title Improve functional limitation score to 59    Time 6    Period Weeks    Status New    Target Date 09/12/21  Plan - 08/22/21 0807     Clinical Impression Statement Scheduled for [facility] 08/25/21. Continued work on cervical tightness; postural strength; alignment. Progressing well. Added lifting activites without difficulty. Modified deadlift and bent rows.    Rehab Potential Good    PT Frequency 2x / week    PT Duration 6 weeks    PT Treatment/Interventions ADLs/Self Care Home Management;Aquatic Therapy;Cryotherapy;Electrical Stimulation;Iontophoresis 4mg /ml Dexamethasone;Moist Heat;Ultrasound;Therapeutic activities;Therapeutic exercise;Neuromuscular re-education;Patient/family education;Manual techniques;Dry needling;Taping    PT Next Visit Plan review and progress HEP; continue with postural correction/education; posterior shoulder girdle strengthening; continue DN and manual work; modalities as indicated    PT Home Exercise Plan [place] and Agree with Plan of Care Patient             Patient will benefit from skilled therapeutic intervention in order to improve the following deficits and impairments:     Visit Diagnosis: Cervicalgia  Other symptoms and signs involving the musculoskeletal system  Muscle weakness (generalized)  Abnormal posture     Problem List Patient Active Problem List   Diagnosis Date Noted   Diabetes ([place]) 08/16/2021   Status post total replacement of right hip 08/05/2019   Unilateral primary osteoarthritis, right hip 06/23/2019   Nonallopathic lesion of sacral region 06/13/2019   Nonallopathic lesion of lumbosacral region 06/13/2019   Nonallopathic lesion of thoracic region 06/13/2019   Right hip pain 06/13/2019   Left knee pain 01/17/2019   Spinal stenosis in cervical region 03/27/2018   Testosterone deficiency 08/22/2017   Hyperlipidemia 08/22/2017   Chronic lower back pain 08/22/2017   Cervical myelopathy ([place]) 05/15/2016   Vitamin D deficiency 05/15/2016   Fatty liver 03/09/2016   Positive TB test 05/28/2012   GERD  (gastroesophageal reflux disease) 11/21/2010   Dyshidrotic eczema 11/21/2010    Jaquavius Hudler Nilda Simmer, PT, MPH  08/22/2021, 8:45 AM  [facility] [place], [place], [address] Phone: [phone]   Fax:  [phone]  Name: JAIANNA NICOLL MRN: [id number] Date of Birth: [birth year]

## 2021-08-25 ENCOUNTER — Ambulatory Visit: Payer: No Typology Code available for payment source | Admitting: Rehabilitative and Restorative Service Providers"

## 2021-08-25 ENCOUNTER — Encounter: Payer: Self-pay | Admitting: Internal Medicine

## 2021-08-25 MED ORDER — METFORMIN HCL 500 MG PO TABS: 500.0000 mg | ORAL_TABLET | Freq: Two times a day (BID) | ORAL | 0 refills | Status: DC

## 2021-08-25 NOTE — Progress Notes (Signed)
Outside notes received. Information abstracted. Notes sent to scan.  

## 2021-08-25 NOTE — Addendum Note (Signed)
Addended by: Binnie Rail on: 08/25/2021 03:48 PM   Modules accepted: Orders

## 2021-08-29 ENCOUNTER — Encounter: Payer: Self-pay | Admitting: Rehabilitative and Restorative Service Providers"

## 2021-08-29 ENCOUNTER — Ambulatory Visit
Payer: No Typology Code available for payment source | Attending: Neurosurgery | Admitting: Rehabilitative and Restorative Service Providers"

## 2021-08-29 ENCOUNTER — Other Ambulatory Visit: Payer: Self-pay

## 2021-08-29 DIAGNOSIS — R293 Abnormal posture: Secondary | ICD-10-CM | POA: Insufficient documentation

## 2021-08-29 DIAGNOSIS — M6281 Muscle weakness (generalized): Secondary | ICD-10-CM | POA: Insufficient documentation

## 2021-08-29 DIAGNOSIS — R29898 Other symptoms and signs involving the musculoskeletal system: Secondary | ICD-10-CM | POA: Insufficient documentation

## 2021-08-29 DIAGNOSIS — M542 Cervicalgia: Secondary | ICD-10-CM | POA: Insufficient documentation

## 2021-08-29 NOTE — Therapy (Signed)
[place], [place], [address] Phone: [phone]   Fax:  [phone]  Physical Therapy Treatment  Patient Details  Name: Sarah Mullen MRN: [id number] Date of Birth: [birth year] Referring Provider (PT): Dr Kary Kos   Encounter Date: 08/29/2021   PT End of Session - 08/29/21 0945     Visit Number 8    Number of Visits 12    Date for PT Re-Evaluation 09/12/21    PT Start Time 0944    PT Stop Time 1032    PT Time Calculation (min) 48 min    Activity Tolerance Patient tolerated treatment well             Past Medical History:  Diagnosis Date   Allergic rhinitis, cause unspecified    Blood transfusion    iron transfusion- [place]-2009   Chronic headaches    Depression    Diverticulitis    GERD (gastroesophageal reflux disease)    Heart murmur    diag with echo-physiologic murmur; trace MR, trivial, early systolic MR by 03/54/65 echo ([facility])   History of sinus tachycardia    07/04/07 Holter monitor: 69-154, average HR 95 bpm. Occ PACs, 4 runs probable atrial tachycardia   Menometrorrhagia 11/21/2010   Migraines    Pre-diabetes     Past Surgical History:  Procedure Laterality Date   ABDOMINAL HYSTERECTOMY  09/2011   ANTERIOR CERVICAL DECOMP/DISCECTOMY FUSION N/A 03/27/2018   Procedure: Anteroir Cervical Decrompression Fusion - Cervical Three - Cervical Four - Cervical Four - Cervical Five - Cervical Five - Cervical Six;  Surgeon: Kary Kos, MD;  Location: [facility];  Service: Neurosurgery;  Laterality: N/A;  Anteroir Cervical Decrompression Fusion - Cervical Three - Cervical Four - Cervical Four - Cervical Five - Cervical Five - Cervical Six   BREAST BIOPSY     right breast/benign   CERCLAGE REMOVAL  1986   WHEN pregnant   CESAREAN SECTION  1986   1 time   COLONOSCOPY     CYSTOSCOPY  10/17/2011   Procedure: CYSTOSCOPY;  Surgeon: Megan Salon, MD;  Location: [facility];   Service: Gynecology;;   DILATION AND CURETTAGE OF UTERUS     ESOPHAGOGASTRODUODENOSCOPY     TONSILLECTOMY     TOTAL HIP ARTHROPLASTY Right 08/05/2019   Procedure: RIGHT TOTAL HIP ARTHROPLASTY ANTERIOR APPROACH;  Surgeon: Mcarthur Rossetti, MD;  Location: [facility];  Service: Orthopedics;  Laterality: Right;    There were no vitals filed for this visit.   Subjective Assessment - 08/29/21 0946     Subjective Patient reports that she is running late today. Had the [facility] Thursday and thinks it may have helped some. She has no pain but does have continued numbness in the Rt UE.    Currently in Pain? No/denies    Pain Score 0-No pain    Pain Location Shoulder    Pain Orientation Right                [facility] PT Assessment - 08/29/21 0001       Assessment   Medical Diagnosis Cervical radiculopathy    Referring Provider (PT) Dr Kary Kos    Onset Date/Surgical Date 05/24/21    Hand Dominance Right    Next MD Visit 08/30/21    Prior Therapy here post cervical fusion      AROM   Cervical Flexion 46    Cervical Extension 55    Cervical -  Right Side Bend 33    Cervical - Left Side Bend 35    Cervical - Right Rotation 62    Cervical - Left Rotation 60      Strength   Right Shoulder Flexion 5/5    Right Shoulder ABduction 5/5    Right Shoulder External Rotation 5/5    Left Shoulder Flexion 5/5    Left Shoulder ABduction 5/5    Left Shoulder External Rotation 5/5    Right Hand Grip (lbs) 50    Right Hand Lateral Pinch 5 lbs    Left Hand Grip (lbs) 54    Left Hand Lateral Pinch 11 lbs      Palpation   Palpation comment muscular tightness noted through Rt > Lt ant/lat/post cervical musculature; pecs; upper trap; leveator                           [facility] Adult PT Treatment/Exercise - 08/29/21 0001       Self-Care   Lifting hinged hip deadlift 15#KB from 8 in stool x 10 reps      Therapeutic Activites    Therapeutic Activities Lifting    Lifting bent row 10#  KB x 10 each side      Neck Exercises: Seated   Lateral Flexion Right;Left;5 reps    Lateral Flexion Limitations 5-10 sec hold      Shoulder Exercises: Standing   External Rotation Strengthening;Right;Left;10 reps;Theraband    Theraband Level (Shoulder External Rotation) Level 3 (Green)    Extension Strengthening;Both;10 reps;Theraband    Theraband Level (Shoulder Extension) Level 4 (Blue)    Extension Limitations lat pull    Row Strengthening;Both;10 reps;Theraband    Theraband Level (Shoulder Row) Level 4 (Blue)    Row Limitations bow and arrow x 10 reps each side green TB    Retraction Strengthening;Both;10 reps;Theraband    Theraband Level (Shoulder Retraction) Level 2 (Red)      Shoulder Exercises: Stretch   Other Shoulder Stretches doorway stretch 30 sec x 2 reps each position      Moist Heat Therapy   Number Minutes Moist Heat 10 Minutes    Moist Heat Location Cervical;Shoulder      Manual Therapy   Manual therapy comments skilled palpation to assess response to DN and manual work    Joint Mobilization Grade II/III PA and lateral mobs upper cervical (fused C4/5/6)    Soft tissue mobilization deep tissue work through the ant/lat/post cervical musculature; upper trap; pecs inot Rt arm/forearm/hand    Myofascial Release upper traps    Passive ROM cervical stretch into flexion; flexion with slight rotation/lateral flexion; lateral flexion    Manual Traction cervical 10-15 sec x 3 reps              Trigger Point Dry Needling - 08/29/21 0001     Consent Given? Yes    Education Handout Provided Previously provided    Upper Trapezius Response Palpable increased muscle length    Scalenes Response Palpable increased muscle length    Cervical multifidi Response Palpable increased muscle length                        PT Long Term Goals - 08/01/21 0927       PT LONG TERM GOAL #1   Title independent with HEP    Time 6    Period Weeks    Status New  Target Date 09/12/21      PT LONG TERM GOAL #2   Title verbalize and demonstrate proper posture with posterior shoulder girdle engaged to decrease risk of recurrent cervical problems    Baseline -    Time 6    Period Weeks    Status New    Target Date 09/12/21      PT LONG TERM GOAL #3   Title demonstrate increased grip strength by 5 pounds and lateral pinch by 3 pounds    Baseline -    Time 6    Period Weeks    Status New    Target Date 09/12/21      PT LONG TERM GOAL #4   Title Improve functional limitation score to 59    Time 6    Period Weeks    Status New    Target Date 09/12/21                   Plan - 08/29/21 0948     Clinical Impression Statement No significant change in the numbness following ESI. Patient is not really having pain in the neck and shoulder area now. Working on exercises and posture and positions at home. Gradual progress toward stated goals of therapy.    Rehab Potential Good    PT Frequency 2x / week    PT Duration 6 weeks    PT Treatment/Interventions ADLs/Self Care Home Management;Aquatic Therapy;Cryotherapy;Electrical Stimulation;Iontophoresis 4mg /ml Dexamethasone;Moist Heat;Ultrasound;Therapeutic activities;Therapeutic exercise;Neuromuscular re-education;Patient/family education;Manual techniques;Dry needling;Taping    PT Next Visit Plan review and progress HEP; continue with postural correction/education; posterior shoulder girdle strengthening; continue DN and manual work; modalities as indicated    PT Home Exercise Plan [place] and Agree with Plan of Care Patient             Patient will benefit from skilled therapeutic intervention in order to improve the following deficits and impairments:     Visit Diagnosis: Cervicalgia  Other symptoms and signs involving the musculoskeletal system  Muscle weakness (generalized)  Abnormal posture     Problem List Patient Active Problem List   Diagnosis Date Noted    Diabetes ([place]) 08/16/2021   Status post total replacement of right hip 08/05/2019   Unilateral primary osteoarthritis, right hip 06/23/2019   Nonallopathic lesion of sacral region 06/13/2019   Nonallopathic lesion of lumbosacral region 06/13/2019   Nonallopathic lesion of thoracic region 06/13/2019   Right hip pain 06/13/2019   Left knee pain 01/17/2019   Spinal stenosis in cervical region 03/27/2018   Testosterone deficiency 08/22/2017   Hyperlipidemia 08/22/2017   Chronic lower back pain 08/22/2017   Cervical myelopathy ([place]) 05/15/2016   Vitamin D deficiency 05/15/2016   Fatty liver 03/09/2016   Positive TB test 05/28/2012   GERD (gastroesophageal reflux disease) 11/21/2010   Dyshidrotic eczema 11/21/2010    Albertha Beattie Nilda Simmer, PT, MPH  08/29/2021, 10:18 AM  [facility] [place], [place], [address] Phone: [phone]   Fax:  [phone]  Name: TEJASVI BRISSETT MRN: [id number] Date of Birth: [birth year]

## 2021-08-30 ENCOUNTER — Encounter: Payer: Self-pay | Admitting: Internal Medicine

## 2021-09-01 ENCOUNTER — Ambulatory Visit: Payer: No Typology Code available for payment source | Admitting: Rehabilitative and Restorative Service Providers"

## 2021-09-01 ENCOUNTER — Encounter: Payer: Self-pay | Admitting: Rehabilitative and Restorative Service Providers"

## 2021-09-01 ENCOUNTER — Other Ambulatory Visit: Payer: Self-pay

## 2021-09-01 DIAGNOSIS — R29898 Other symptoms and signs involving the musculoskeletal system: Secondary | ICD-10-CM

## 2021-09-01 DIAGNOSIS — M542 Cervicalgia: Secondary | ICD-10-CM | POA: Diagnosis not present

## 2021-09-01 DIAGNOSIS — R293 Abnormal posture: Secondary | ICD-10-CM

## 2021-09-01 DIAGNOSIS — M6281 Muscle weakness (generalized): Secondary | ICD-10-CM

## 2021-09-01 NOTE — Therapy (Signed)
[place], [place], [address] Phone: [phone]   Fax:  [phone]  Physical Therapy Treatment  Patient Details  Name: Sarah Mullen MRN: [id number] Date of Birth: [birth year] Referring Provider (PT): Dr Kary Kos   Encounter Date: 09/01/2021   PT End of Session - 09/01/21 1019     Visit Number 9    Number of Visits 12    Date for PT Re-Evaluation 09/12/21    PT Start Time 1017    PT Stop Time 1105    PT Time Calculation (min) 48 min    Activity Tolerance Patient tolerated treatment well             Past Medical History:  Diagnosis Date   Allergic rhinitis, cause unspecified    Blood transfusion    iron transfusion- [place]-2009   Chronic headaches    Depression    Diverticulitis    GERD (gastroesophageal reflux disease)    Heart murmur    diag with echo-physiologic murmur; trace MR, trivial, early systolic MR by 01/14/75 echo ([facility])   History of sinus tachycardia    07/04/07 Holter monitor: 69-154, average HR 95 bpm. Occ PACs, 4 runs probable atrial tachycardia   Menometrorrhagia 11/21/2010   Migraines    Pre-diabetes     Past Surgical History:  Procedure Laterality Date   ABDOMINAL HYSTERECTOMY  09/2011   ANTERIOR CERVICAL DECOMP/DISCECTOMY FUSION N/A 03/27/2018   Procedure: Anteroir Cervical Decrompression Fusion - Cervical Three - Cervical Four - Cervical Four - Cervical Five - Cervical Five - Cervical Six;  Surgeon: Kary Kos, MD;  Location: [facility];  Service: Neurosurgery;  Laterality: N/A;  Anteroir Cervical Decrompression Fusion - Cervical Three - Cervical Four - Cervical Four - Cervical Five - Cervical Five - Cervical Six   BREAST BIOPSY     right breast/benign   CERCLAGE REMOVAL  1986   WHEN pregnant   CESAREAN SECTION  1986   1 time   COLONOSCOPY     CYSTOSCOPY  10/17/2011   Procedure: CYSTOSCOPY;  Surgeon: Megan Salon, MD;  Location: [facility];   Service: Gynecology;;   DILATION AND CURETTAGE OF UTERUS     ESOPHAGOGASTRODUODENOSCOPY     TONSILLECTOMY     TOTAL HIP ARTHROPLASTY Right 08/05/2019   Procedure: RIGHT TOTAL HIP ARTHROPLASTY ANTERIOR APPROACH;  Surgeon: Mcarthur Rossetti, MD;  Location: [facility];  Service: Orthopedics;  Laterality: Right;    There were no vitals filed for this visit.   Subjective Assessment - 09/01/21 1019     Subjective Patient reports that she saw the doctor yesterday. MD is pleased with progress and will keep her out of work for 1 more month. She will continue top work on [place].    Currently in Pain? No/denies    Pain Score 0-No pain                               [facility] Adult PT Treatment/Exercise - 09/01/21 0001       Shoulder Exercises: Standing   External Rotation Strengthening;Right;Left;10 reps;Theraband    Theraband Level (Shoulder External Rotation) Level 4 (Blue)    External Rotation Limitations isometric hold 30 sec x 3 reps each side    Extension Strengthening;Both;10 reps;Theraband    Theraband Level (Shoulder Extension) Level 4 (Blue)    Extension Limitations lat pull    Row  Strengthening;Both;10 reps;Theraband    Theraband Level (Shoulder Row) Level 4 (Blue)    Row Limitations bow and arrow x 10 reps each side green TB    Retraction Strengthening;Both;10 reps;Theraband    Theraband Level (Shoulder Retraction) Level 2 (Red)    Other Standing Exercises serratus activation red TB x 10    Other Standing Exercises wall clock red TB x 10 each side      Shoulder Exercises: Stretch   Other Shoulder Stretches doorway stretch 30 sec x 2 reps each position      Manual Therapy   Manual therapy comments skilled palpation to assess response to DN and manual work    Joint Mobilization Grade II/III PA and lateral mobs upper cervical (fused C4/5/6)    Soft tissue mobilization deep tissue work through the ant/lat/post cervical musculature; upper trap; pecs  inot Rt arm/forearm/hand    Myofascial Release upper traps              Trigger Point Dry Needling - 09/01/21 0001     Consent Given? Yes    Education Handout Provided Previously provided    Upper Trapezius Response Palpable increased muscle length    Scalenes Response Palpable increased muscle length    Cervical multifidi Response Palpable increased muscle length    Thoracic multifidi response Palpable increased muscle length;Twitch response elicited                   PT Education - 09/01/21 1040     Education Details HEP    Person(s) Educated Patient    Methods Explanation;Demonstration;Tactile cues;Verbal cues;Handout    Comprehension Verbalized understanding;Returned demonstration;Verbal cues required;Tactile cues required                 PT Long Term Goals - 08/01/21 0927       PT LONG TERM GOAL #1   Title independent with HEP    Time 6    Period Weeks    Status New    Target Date 09/12/21      PT LONG TERM GOAL #2   Title verbalize and demonstrate proper posture with posterior shoulder girdle engaged to decrease risk of recurrent cervical problems    Baseline -    Time 6    Period Weeks    Status New    Target Date 09/12/21      PT LONG TERM GOAL #3   Title demonstrate increased grip strength by 5 pounds and lateral pinch by 3 pounds    Baseline -    Time 6    Period Weeks    Status New    Target Date 09/12/21      PT LONG TERM GOAL #4   Title Improve functional limitation score to 59    Time 6    Period Weeks    Status New    Target Date 09/12/21                   Plan - 09/01/21 1021     Clinical Impression Statement No pain - continued numbness in the Rt ring and little fingers which is constant. Still has weakness in the Rt UE. Working on [employer]. Progressing with rehab.    Rehab Potential Good    PT Frequency 2x / week    PT Duration 6 weeks    PT Treatment/Interventions ADLs/Self Care Home Management;Aquatic  Therapy;Cryotherapy;Electrical Stimulation;Iontophoresis 4mg /ml Dexamethasone;Moist Heat;Ultrasound;Therapeutic activities;Therapeutic exercise;Neuromuscular re-education;Patient/family education;Manual techniques;Dry needling;Taping    PT Next Visit  Plan review and progress HEP; continue with postural correction/education; posterior shoulder girdle strengthening; continue DN and manual work; modalities as indicated    PT Home Exercise Plan [email or web address]    Consulted and Agree with Plan of Care Patient             Patient will benefit from skilled therapeutic intervention in order to improve the following deficits and impairments:     Visit Diagnosis: Cervicalgia  Other symptoms and signs involving the musculoskeletal system  Muscle weakness (generalized)  Abnormal posture     Problem List Patient Active Problem List   Diagnosis Date Noted   Diabetes ([place]) 08/16/2021   Status post total replacement of right hip 08/05/2019   Unilateral primary osteoarthritis, right hip 06/23/2019   Nonallopathic lesion of sacral region 06/13/2019   Nonallopathic lesion of lumbosacral region 06/13/2019   Nonallopathic lesion of thoracic region 06/13/2019   Right hip pain 06/13/2019   Left knee pain 01/17/2019   Spinal stenosis in cervical region 03/27/2018   Testosterone deficiency 08/22/2017   Hyperlipidemia 08/22/2017   Chronic lower back pain 08/22/2017   Cervical myelopathy ([place]) 05/15/2016   Vitamin D deficiency 05/15/2016   Fatty liver 03/09/2016   Positive TB test 05/28/2012   GERD (gastroesophageal reflux disease) 11/21/2010   Dyshidrotic eczema 11/21/2010    Asahd Can Nilda Simmer, PT, MPH  09/01/2021, 10:59 AM  [facility] [place], [place], [address] Phone: [phone]   Fax:  [phone]  Name: Sarah Mullen MRN: [id number] Date of Birth: [birth year]

## 2021-09-01 NOTE — Patient Instructions (Signed)
Access Code: [email or web address] URL: [email or web address] Date: 09/01/2021 Prepared by: Gillermo Murdoch  Exercises Seated Cervical Retraction - 3 x daily - 7 x weekly - 1 sets - 10 reps Standing Scapular Retraction - 3 x daily - 7 x weekly - 1 sets - 10 reps - 10 hold Shoulder External Rotation and Scapular Retraction - 3 x daily - 7 x weekly - 1 sets - 10 reps - hold Shoulder External Rotation in 45 Degrees Abduction - 2 x daily - 7 x weekly - 1-2 sets - 10 reps - 3 sec hold Doorway Pec Stretch at 60 Degrees Abduction - 3 x daily - 7 x weekly - 1 sets - 3 reps Doorway Pec Stretch at 90 Degrees Abduction - 3 x daily - 7 x weekly - 1 sets - 3 reps - 30 seconds hold Doorway Pec Stretch at 120 Degrees Abduction - 3 x daily - 7 x weekly - 1 sets - 3 reps - 30 second hold hold Supine Thoracic Mobilization Towel Roll Vertical with Arm Stretch - 2 x daily - 7 x weekly - 1 sets - 1-3 reps - 2-3 min hold Shoulder External Rotation and Scapular Retraction with Resistance - 2 x daily - 7 x weekly - 1 sets - 10 reps - 3-5 sec hold Standing Bilateral Low Shoulder Row with Anchored Resistance - 2 x daily - 7 x weekly - 1-3 sets - 10 reps - 2-3 sec hold Drawing Bow - 1 x daily - 7 x weekly - 1 sets - 10 reps - 3 sec hold Gripping Sponge Neutral - 2 x daily - 7 x weekly - 1 sets - 10-20 reps - 5-10 sec hold Shoulder External Rotation Reactive Isometrics - 2 x daily - 7 x weekly - 1 sets - 10 reps - 3 sec hold Seated Cervical Sidebending AROM - 2 x daily - 7 x weekly - 1 sets - 5 reps - 5-10 sec hold Standing Lat Pull Down with Resistance - Elbows Bent - 2 x daily - 7 x weekly - 1 sets - 10 reps - 3 sec hold Shoulder Flexion Serratus Activation with Resistance - 1 x daily - 7 x weekly - 1 sets - 10 reps - 3-5 sec hold Wall Clock with Theraband - 1 x daily - 7 x weekly - 1 sets - 10 reps - 2-3 sec hold Shoulder External Rotation Reactive Isometrics - 2 x daily - 7 x weekly - 1 sets - 10 reps - 3 sec hold

## 2021-09-05 ENCOUNTER — Encounter: Payer: Self-pay | Admitting: Internal Medicine

## 2021-09-05 NOTE — Progress Notes (Signed)
Report already abstracted

## 2021-09-07 ENCOUNTER — Other Ambulatory Visit: Payer: Self-pay

## 2021-09-07 ENCOUNTER — Encounter: Payer: Self-pay | Admitting: Rehabilitative and Restorative Service Providers"

## 2021-09-07 ENCOUNTER — Ambulatory Visit: Payer: No Typology Code available for payment source | Admitting: Rehabilitative and Restorative Service Providers"

## 2021-09-07 DIAGNOSIS — R293 Abnormal posture: Secondary | ICD-10-CM

## 2021-09-07 DIAGNOSIS — R29898 Other symptoms and signs involving the musculoskeletal system: Secondary | ICD-10-CM

## 2021-09-07 DIAGNOSIS — M542 Cervicalgia: Secondary | ICD-10-CM

## 2021-09-07 DIAGNOSIS — M6281 Muscle weakness (generalized): Secondary | ICD-10-CM

## 2021-09-07 NOTE — Therapy (Signed)
[place]  [address] [place], [place], [address] Phone: [phone]   Fax:  [phone]  Physical Therapy Treatment  Patient Details  Name: Sarah Mullen MRN: [id number] Date of Birth: [birth year] Referring Provider (PT): Dr Kary Kos   Encounter Date: 09/07/2021   PT End of Session - 09/07/21 1109     Visit Number 10    Number of Visits 12    Date for PT Re-Evaluation 09/12/21    PT Start Time 1107    PT Stop Time 1155    PT Time Calculation (min) 48 min    Activity Tolerance Patient tolerated treatment well             Past Medical History:  Diagnosis Date   Allergic rhinitis, cause unspecified    Blood transfusion    iron transfusion- [place]-2009   Chronic headaches    Depression    Diverticulitis    GERD (gastroesophageal reflux disease)    Heart murmur    diag with echo-physiologic murmur; trace MR, trivial, early systolic MR by 55/73/22 echo ([facility])   History of sinus tachycardia    07/04/07 Holter monitor: 69-154, average HR 95 bpm. Occ PACs, 4 runs probable atrial tachycardia   Menometrorrhagia 11/21/2010   Migraines    Pre-diabetes     Past Surgical History:  Procedure Laterality Date   ABDOMINAL HYSTERECTOMY  09/2011   ANTERIOR CERVICAL DECOMP/DISCECTOMY FUSION N/A 03/27/2018   Procedure: Anteroir Cervical Decrompression Fusion - Cervical Three - Cervical Four - Cervical Four - Cervical Five - Cervical Five - Cervical Six;  Surgeon: Kary Kos, MD;  Location: [facility];  Service: Neurosurgery;  Laterality: N/A;  Anteroir Cervical Decrompression Fusion - Cervical Three - Cervical Four - Cervical Four - Cervical Five - Cervical Five - Cervical Six   BREAST BIOPSY     right breast/benign   CERCLAGE REMOVAL  1986   WHEN pregnant   CESAREAN SECTION  1986   1 time   COLONOSCOPY     CYSTOSCOPY  10/17/2011   Procedure: CYSTOSCOPY;  Surgeon: Megan Salon, MD;  Location: [facility];   Service: Gynecology;;   DILATION AND CURETTAGE OF UTERUS     ESOPHAGOGASTRODUODENOSCOPY     TONSILLECTOMY     TOTAL HIP ARTHROPLASTY Right 08/05/2019   Procedure: RIGHT TOTAL HIP ARTHROPLASTY ANTERIOR APPROACH;  Surgeon: Mcarthur Rossetti, MD;  Location: [facility];  Service: Orthopedics;  Laterality: Right;    There were no vitals filed for this visit.   Subjective Assessment - 09/07/21 1111     Subjective Rt UE is still numb all the time. She is working on her exercises at home. She has ordered some putty but it has come in yet.    Currently in Pain? No/denies    Pain Score 0-No pain    Pain Location Shoulder    Pain Orientation Right    Pain Descriptors / Indicators Numbness    Pain Type Acute pain                [facility] PT Assessment - 09/07/21 0001       Assessment   Medical Diagnosis Cervical radiculopathy    Referring Provider (PT) Dr Kary Kos    Onset Date/Surgical Date 05/24/21    Hand Dominance Right    Next MD Visit 08/30/21    Prior Therapy here post cervical fusion      Palpation   Palpation comment muscular tightness  noted through Rt > Lt ant/lat/post cervical musculature; pecs; upper trap; leveator                           [facility] Adult PT Treatment/Exercise - 09/07/21 0001       Therapeutic Activites    ADL's working on pinch and grip activities with various tools    Other Therapeutic Activities review of activities for home using objects to strengthen Rt grip and pinch      Neuro Re-ed    Neuro Re-ed Details  working on posture and alignment through cervical and thoracic spine      Shoulder Exercises: ROM/Strengthening   UBE (Upper Arm Bike) L4 x 4 min alt fwd/back      Shoulder Exercises: Stretch   Other Shoulder Stretches doorway stretch 30 sec x 2 reps each position      Moist Heat Therapy   Number Minutes Moist Heat 10 Minutes    Moist Heat Location Cervical;Shoulder      Manual Therapy   Manual therapy comments skilled  palpation to assess response to DN and manual work    Joint Mobilization Grade II/III PA and lateral mobs upper cervical (fused C4/5/6)    Soft tissue mobilization deep tissue work through the ant/lat/post cervical musculature; upper trap; pecs inot Rt arm/forearm/hand    Myofascial Release upper traps              Trigger Point Dry Needling - 09/07/21 0001     Consent Given? Yes    Education Handout Provided Previously provided    Upper Trapezius Response Palpable increased muscle length    Scalenes Response Palpable increased muscle length    Cervical multifidi Response Palpable increased muscle length    Thoracic multifidi response Palpable increased muscle length;Twitch response elicited                        PT Long Term Goals - 08/01/21 0927       PT LONG TERM GOAL #1   Title independent with HEP    Time 6    Period Weeks    Status New    Target Date 09/12/21      PT LONG TERM GOAL #2   Title verbalize and demonstrate proper posture with posterior shoulder girdle engaged to decrease risk of recurrent cervical problems    Baseline -    Time 6    Period Weeks    Status New    Target Date 09/12/21      PT LONG TERM GOAL #3   Title demonstrate increased grip strength by 5 pounds and lateral pinch by 3 pounds    Baseline -    Time 6    Period Weeks    Status New    Target Date 09/12/21      PT LONG TERM GOAL #4   Title Improve functional limitation score to 59    Time 6    Period Weeks    Status New    Target Date 09/12/21                   Plan - 09/07/21 1142     Clinical Impression Statement Paitent ordered putty byut has not received the putty yet. Worked today on various gripping and pinching activities to improve strength Rt hand. Recert next visit.    Rehab Potential Good    PT Frequency 2x / week  PT Duration 6 weeks    PT Treatment/Interventions ADLs/Self Care Home Management;Aquatic Therapy;Cryotherapy;Electrical  Stimulation;Iontophoresis 4mg /ml Dexamethasone;Moist Heat;Ultrasound;Therapeutic activities;Therapeutic exercise;Neuromuscular re-education;Patient/family education;Manual techniques;Dry needling;Taping    PT Next Visit Plan review and progress HEP; continue with postural correction/education; posterior shoulder girdle strengthening; continue DN and manual work; modalities as indicated [place] and Agree with Plan of Care Patient             Patient will benefit from skilled therapeutic intervention in order to improve the following deficits and impairments:     Visit Diagnosis: Cervicalgia  Other symptoms and signs involving the musculoskeletal system  Muscle weakness (generalized)  Abnormal posture     Problem List Patient Active Problem List   Diagnosis Date Noted   Diabetes ([place]) 08/16/2021   Status post total replacement of right hip 08/05/2019   Unilateral primary osteoarthritis, right hip 06/23/2019   Nonallopathic lesion of sacral region 06/13/2019   Nonallopathic lesion of lumbosacral region 06/13/2019   Nonallopathic lesion of thoracic region 06/13/2019   Right hip pain 06/13/2019   Left knee pain 01/17/2019   Spinal stenosis in cervical region 03/27/2018   Testosterone deficiency 08/22/2017   Hyperlipidemia 08/22/2017   Chronic lower back pain 08/22/2017   Cervical myelopathy ([place]) 05/15/2016   Vitamin D deficiency 05/15/2016   Fatty liver 03/09/2016   Positive TB test 05/28/2012   GERD (gastroesophageal reflux disease) 11/21/2010   Dyshidrotic eczema 11/21/2010    Na Waldrip Nilda Simmer, PT, MPH  09/07/2021, 11:46 AM  [facility] [place], [place], [address] Phone: [phone]   Fax:  [phone]  Name: Sarah Mullen MRN: [id number] Date of Birth: [birth year]

## 2021-09-12 ENCOUNTER — Ambulatory Visit: Payer: No Typology Code available for payment source | Admitting: Rehabilitative and Restorative Service Providers"

## 2021-09-12 ENCOUNTER — Encounter: Payer: Self-pay | Admitting: Rehabilitative and Restorative Service Providers"

## 2021-09-12 ENCOUNTER — Other Ambulatory Visit: Payer: Self-pay

## 2021-09-12 DIAGNOSIS — R29898 Other symptoms and signs involving the musculoskeletal system: Secondary | ICD-10-CM

## 2021-09-12 DIAGNOSIS — M542 Cervicalgia: Secondary | ICD-10-CM | POA: Diagnosis not present

## 2021-09-12 DIAGNOSIS — M6281 Muscle weakness (generalized): Secondary | ICD-10-CM

## 2021-09-12 DIAGNOSIS — R293 Abnormal posture: Secondary | ICD-10-CM

## 2021-09-12 NOTE — Therapy (Signed)
[place], [place], [address] Phone: [phone]   Fax:  [phone]  Physical Therapy Treatment  Patient Details  Name: Sarah Mullen MRN: [id number] Date of Birth: [birth year] Referring Provider (PT): Dr Kary Kos   Encounter Date: 09/12/2021   PT End of Session - 09/12/21 0948     Visit Number 11    Number of Visits 22    Date for PT Re-Evaluation 10/24/21    PT Start Time 0947    PT Stop Time 1038    PT Time Calculation (min) 51 min    Activity Tolerance Patient tolerated treatment well             Past Medical History:  Diagnosis Date   Allergic rhinitis, cause unspecified    Blood transfusion    iron transfusion- [place]-2009   Chronic headaches    Depression    Diverticulitis    GERD (gastroesophageal reflux disease)    Heart murmur    diag with echo-physiologic murmur; trace MR, trivial, early systolic MR by 17/79/39 echo ([facility])   History of sinus tachycardia    07/04/07 Holter monitor: 69-154, average HR 95 bpm. Occ PACs, 4 runs probable atrial tachycardia   Menometrorrhagia 11/21/2010   Migraines    Pre-diabetes     Past Surgical History:  Procedure Laterality Date   ABDOMINAL HYSTERECTOMY  09/2011   ANTERIOR CERVICAL DECOMP/DISCECTOMY FUSION N/A 03/27/2018   Procedure: Anteroir Cervical Decrompression Fusion - Cervical Three - Cervical Four - Cervical Four - Cervical Five - Cervical Five - Cervical Six;  Surgeon: Kary Kos, MD;  Location: [facility];  Service: Neurosurgery;  Laterality: N/A;  Anteroir Cervical Decrompression Fusion - Cervical Three - Cervical Four - Cervical Four - Cervical Five - Cervical Five - Cervical Six   BREAST BIOPSY     right breast/benign   CERCLAGE REMOVAL  1986   WHEN pregnant   CESAREAN SECTION  1986   1 time   COLONOSCOPY     CYSTOSCOPY  10/17/2011   Procedure: CYSTOSCOPY;  Surgeon: Megan Salon, MD;  Location: [facility];   Service: Gynecology;;   DILATION AND CURETTAGE OF UTERUS     ESOPHAGOGASTRODUODENOSCOPY     TONSILLECTOMY     TOTAL HIP ARTHROPLASTY Right 08/05/2019   Procedure: RIGHT TOTAL HIP ARTHROPLASTY ANTERIOR APPROACH;  Surgeon: Mcarthur Rossetti, MD;  Location: [facility];  Service: Orthopedics;  Laterality: Right;    There were no vitals filed for this visit.   Subjective Assessment - 09/12/21 0949     Subjective Patient reports that her neck is feeling good and her hand is getting stronger. She has been using  the putty and that seems to help a lot.    Currently in Pain? No/denies    Pain Score 0-No pain    Pain Location Neck    Pain Orientation Right    Pain Descriptors / Indicators Numbness    Pain Type Acute pain    Pain Radiating Towards numbness ring and little fingers    Pain Onset Yesterday    Pain Frequency Constant    Aggravating Factors  constant numbness    Pain Relieving Factors rest; meds                [facility] PT Assessment - 09/12/21 0001       Assessment   Medical Diagnosis Cervical radiculopathy    Referring Provider (PT) Dr Kary Kos  Onset Date/Surgical Date 05/24/21    Hand Dominance Right    Next MD Visit 09/27/21    Prior Therapy here post cervical fusion      Observation/Other Assessments   Focus on Therapeutic Outcomes (FOTO)  68      AROM   Cervical Flexion 62    Cervical Extension 64    Cervical - Right Side Bend 38    Cervical - Left Side Bend 42    Cervical - Right Rotation 64    Cervical - Left Rotation 68      Strength   Right Shoulder Flexion 5/5    Right Shoulder ABduction 5/5    Right Shoulder External Rotation 5/5    Left Shoulder Flexion 5/5    Left Shoulder ABduction 5/5    Left Shoulder External Rotation 5/5    Right Hand Grip (lbs) 55    Right Hand Lateral Pinch 6 lbs    Left Hand Grip (lbs) 62    Left Hand Lateral Pinch 11 lbs      Palpation   Palpation comment muscular tightness noted through Rt > Lt ant/lat/post  cervical musculature; pecs; upper trap; leveator                           [facility] Adult PT Treatment/Exercise - 09/12/21 0001       Therapeutic Activites    ADL's working on pinch and grip activities with putty patient provided    Other Therapeutic Activities review of activities for home using objects to strengthen Rt grip and pinch      Shoulder Exercises: ROM/Strengthening   UBE (Upper Arm Bike) L4 x 4 min alt fwd/back      Moist Heat Therapy   Number Minutes Moist Heat 10 Minutes    Moist Heat Location Cervical;Shoulder      Manual Therapy   Manual therapy comments skilled palpation to assess response to DN and manual work    Joint Mobilization Grade II/III PA and lateral mobs upper cervical (fused C4/5/6)    Soft tissue mobilization deep tissue work through the ant/lat/post cervical musculature; upper trap              Trigger Point Dry Needling - 09/12/21 0001     Consent Given? Yes    Education Handout Provided Previously provided    Upper Trapezius Response Palpable increased muscle length    Thoracic multifidi response Palpable increased muscle length;Twitch response elicited                        PT Long Term Goals - 09/12/21 0952       PT LONG TERM GOAL #1   Title independent with HEP    Time 6    Period Weeks    Status On-going    Target Date 10/24/21      PT LONG TERM GOAL #2   Title verbalize and demonstrate proper posture with posterior shoulder girdle engaged to decrease risk of recurrent cervical problems    Time 6    Period Weeks    Status On-going    Target Date 10/24/21      PT LONG TERM GOAL #3   Title demonstrate increased grip strength by 5 pounds and lateral pinch by 3 pounds    Time 6    Period Weeks    Status Partially Met    Target Date 10/24/21  PT LONG TERM GOAL #4   Title Improve functional limitation score to 59    Time 6    Period Weeks    Status Achieved    Target Date 10/24/21                    Plan - 09/12/21 1028     Clinical Impression Statement Dayrin reports continued improvement with decreased cervical pain and tightness and improving Rt hand strength. She demonstrates improving cervicl and thoracic posture and alignment; increasing cervical ROM/mobilty; increasing grip and pinch strength bilat UE's. Patient is progressing well toward stated goals of therapy and will benefit from continued treatment to accomplish goals and reach maximum rehab potential.    Rehab Potential Good    PT Frequency 2x / week    PT Duration 6 weeks    PT Treatment/Interventions ADLs/Self Care Home Management;Aquatic Therapy;Cryotherapy;Electrical Stimulation;Iontophoresis 18m/ml Dexamethasone;Moist Heat;Ultrasound;Therapeutic activities;Therapeutic exercise;Neuromuscular re-education;Patient/family education;Manual techniques;Dry needling;Taping    PT Next Visit Plan review and progress HEP; continue with postural correction/education; posterior shoulder girdle strengthening; continue DN and manual work; modalities as indicated    PT Home Exercise Plan [place] Agree with Plan of Care Patient             Patient will benefit from skilled therapeutic intervention in order to improve the following deficits and impairments:     Visit Diagnosis: Cervicalgia  Other symptoms and signs involving the musculoskeletal system  Muscle weakness (generalized)  Abnormal posture     Problem List Patient Active Problem List   Diagnosis Date Noted   Diabetes ([place] 08/16/2021   Status post total replacement of right hip 08/05/2019   Unilateral primary osteoarthritis, right hip 06/23/2019   Nonallopathic lesion of sacral region 06/13/2019   Nonallopathic lesion of lumbosacral region 06/13/2019   Nonallopathic lesion of thoracic region 06/13/2019   Right hip pain 06/13/2019   Left knee pain 01/17/2019   Spinal stenosis in cervical region 03/27/2018    Testosterone deficiency 08/22/2017   Hyperlipidemia 08/22/2017   Chronic lower back pain 08/22/2017   Cervical myelopathy ([place] 05/15/2016   Vitamin D deficiency 05/15/2016   Fatty liver 03/09/2016   Positive TB test 05/28/2012   GERD (gastroesophageal reflux disease) 11/21/2010   Dyshidrotic eczema 11/21/2010    Susette Seminara PNilda Simmer PT, MPH  09/12/2021, 10:32 AM  [facility] [address]Phone: [phone]  Fax:  [phone] Name: LFELISIA BALCOMMRN: [id number] of Birth: 1May 23, 1957

## 2021-09-14 ENCOUNTER — Ambulatory Visit: Payer: No Typology Code available for payment source | Admitting: Rehabilitative and Restorative Service Providers"

## 2021-09-14 ENCOUNTER — Other Ambulatory Visit: Payer: Self-pay

## 2021-09-14 ENCOUNTER — Encounter: Payer: Self-pay | Admitting: Rehabilitative and Restorative Service Providers"

## 2021-09-14 DIAGNOSIS — R293 Abnormal posture: Secondary | ICD-10-CM

## 2021-09-14 DIAGNOSIS — M6281 Muscle weakness (generalized): Secondary | ICD-10-CM

## 2021-09-14 DIAGNOSIS — M542 Cervicalgia: Secondary | ICD-10-CM

## 2021-09-14 DIAGNOSIS — R29898 Other symptoms and signs involving the musculoskeletal system: Secondary | ICD-10-CM

## 2021-09-14 NOTE — Therapy (Signed)
[place], [place], [address] Phone: [phone]   Fax:  [phone]  Physical Therapy Treatment  Patient Details  Name: Sarah Mullen MRN: [id number] Date of Birth: [birth year] Referring Provider (PT): Dr Kary Kos   Encounter Date: 09/14/2021   PT End of Session - 09/14/21 0935     Visit Number 12    Number of Visits 22    Date for PT Re-Evaluation 10/24/21    PT Start Time 0934    PT Stop Time 1022    PT Time Calculation (min) 48 min    Activity Tolerance Patient tolerated treatment well             Past Medical History:  Diagnosis Date   Allergic rhinitis, cause unspecified    Blood transfusion    iron transfusion- [place]-2009   Chronic headaches    Depression    Diverticulitis    GERD (gastroesophageal reflux disease)    Heart murmur    diag with echo-physiologic murmur; trace MR, trivial, early systolic MR by 44/81/85 echo ([facility])   History of sinus tachycardia    07/04/07 Holter monitor: 69-154, average HR 95 bpm. Occ PACs, 4 runs probable atrial tachycardia   Menometrorrhagia 11/21/2010   Migraines    Pre-diabetes     Past Surgical History:  Procedure Laterality Date   ABDOMINAL HYSTERECTOMY  09/2011   ANTERIOR CERVICAL DECOMP/DISCECTOMY FUSION N/A 03/27/2018   Procedure: Anteroir Cervical Decrompression Fusion - Cervical Three - Cervical Four - Cervical Four - Cervical Five - Cervical Five - Cervical Six;  Surgeon: Kary Kos, MD;  Location: [facility];  Service: Neurosurgery;  Laterality: N/A;  Anteroir Cervical Decrompression Fusion - Cervical Three - Cervical Four - Cervical Four - Cervical Five - Cervical Five - Cervical Six   BREAST BIOPSY     right breast/benign   CERCLAGE REMOVAL  1986   WHEN pregnant   CESAREAN SECTION  1986   1 time   COLONOSCOPY     CYSTOSCOPY  10/17/2011   Procedure: CYSTOSCOPY;  Surgeon: Megan Salon, MD;  Location: [facility];   Service: Gynecology;;   DILATION AND CURETTAGE OF UTERUS     ESOPHAGOGASTRODUODENOSCOPY     TONSILLECTOMY     TOTAL HIP ARTHROPLASTY Right 08/05/2019   Procedure: RIGHT TOTAL HIP ARTHROPLASTY ANTERIOR APPROACH;  Surgeon: Mcarthur Rossetti, MD;  Location: [facility];  Service: Orthopedics;  Laterality: Right;    There were no vitals filed for this visit.   Subjective Assessment - 09/14/21 0936     Subjective Patient reports that her neck is feeling good and her hand is getting stronger. She has been using  the putty. Hand is still numb.    Currently in Pain? No/denies    Pain Score 0-No pain    Pain Location Neck                               [facility] Adult PT Treatment/Exercise - 09/14/21 0001       Neck Exercises: Seated   Neck Retraction 5 reps;5 secs    Lateral Flexion Right;Left;5 reps    Lateral Flexion Limitations 5-10 sec hold    W Back 10 reps    W Back Limitations L's x 10    Shoulder Rolls Backwards;10 reps      Shoulder Exercises: Prone   Other Prone Exercises prone series -  scap squeeze 5 sec x 10; goal post x 5 reps; W's x 5 reps      Shoulder Exercises: ROM/Strengthening   UBE (Upper Arm Bike) L4 x 4 min alt fwd/back      Shoulder Exercises: Stretch   Other Shoulder Stretches doorway stretch 30 sec x 2 reps each position      Moist Heat Therapy   Number Minutes Moist Heat 10 Minutes    Moist Heat Location Cervical;Shoulder      Manual Therapy   Manual therapy comments skilled palpation to assess response to DN and manual work    Joint Mobilization Grade II/III PA and lateral mobs upper cervical (fused C4/5/6)    Soft tissue mobilization deep tissue work through the ant/lat/post cervical musculature; upper trap              Trigger Point Dry Needling - 09/14/21 0001     Consent Given? Yes    Education Handout Provided Previously provided    Upper Trapezius Response Palpable increased muscle length    Scalenes Response Palpable  increased muscle length    Cervical multifidi Response Palpable increased muscle length    Thoracic multifidi response Palpable increased muscle length;Twitch response elicited                        PT Long Term Goals - 09/12/21 0952       PT LONG TERM GOAL #1   Title independent with HEP    Time 6    Period Weeks    Status On-going    Target Date 10/24/21      PT LONG TERM GOAL #2   Title verbalize and demonstrate proper posture with posterior shoulder girdle engaged to decrease risk of recurrent cervical problems    Time 6    Period Weeks    Status On-going    Target Date 10/24/21      PT LONG TERM GOAL #3   Title demonstrate increased grip strength by 5 pounds and lateral pinch by 3 pounds    Time 6    Period Weeks    Status Partially Met    Target Date 10/24/21      PT LONG TERM GOAL #4   Title Improve functional limitation score to 59    Time 6    Period Weeks    Status Achieved    Target Date 10/24/21                   Plan - 09/14/21 4709     Clinical Impression Statement Adding exercises with theraputty for home without difficulty. Continued muscular tightness noted through the Rt > Lt upper trap and posterior cervical spine musculature. Good response to Dn and manual work. Added prone exercises for posterior shoulder girdle strengthening.    Rehab Potential Good    PT Frequency 2x / week    PT Duration 6 weeks    PT Treatment/Interventions ADLs/Self Care Home Management;Aquatic Therapy;Cryotherapy;Electrical Stimulation;Iontophoresis 81m/ml Dexamethasone;Moist Heat;Ultrasound;Therapeutic activities;Therapeutic exercise;Neuromuscular re-education;Patient/family education;Manual techniques;Dry needling;Taping    PT Next Visit Plan review and progress HEP; continue with postural correction/education; posterior shoulder girdle strengthening; continue DN and manual work; modalities as indicated    PT Home Exercise Plan [place] and Agree with Plan of Care Patient             Patient will benefit from skilled therapeutic intervention in order to improve the following deficits and  impairments:     Visit Diagnosis: Cervicalgia  Other symptoms and signs involving the musculoskeletal system  Muscle weakness (generalized)  Abnormal posture     Problem List Patient Active Problem List   Diagnosis Date Noted   Diabetes ([place]) 08/16/2021   Status post total replacement of right hip 08/05/2019   Unilateral primary osteoarthritis, right hip 06/23/2019   Nonallopathic lesion of sacral region 06/13/2019   Nonallopathic lesion of lumbosacral region 06/13/2019   Nonallopathic lesion of thoracic region 06/13/2019   Right hip pain 06/13/2019   Left knee pain 01/17/2019   Spinal stenosis in cervical region 03/27/2018   Testosterone deficiency 08/22/2017   Hyperlipidemia 08/22/2017   Chronic lower back pain 08/22/2017   Cervical myelopathy ([place]) 05/15/2016   Vitamin D deficiency 05/15/2016   Fatty liver 03/09/2016   Positive TB test 05/28/2012   GERD (gastroesophageal reflux disease) 11/21/2010   Dyshidrotic eczema 11/21/2010    Mekai Wilkinson Nilda Simmer, PT, MPH 09/14/2021, 10:12 AM  [facility] [place], [place], [address] Phone: [phone]   Fax:  [phone]  Name: IMOGEAN CIAMPA MRN: [id number] Date of Birth: 1955/10/03

## 2021-09-19 ENCOUNTER — Ambulatory Visit: Payer: No Typology Code available for payment source | Admitting: Rehabilitative and Restorative Service Providers"

## 2021-09-19 ENCOUNTER — Other Ambulatory Visit: Payer: Self-pay

## 2021-09-19 ENCOUNTER — Encounter: Payer: Self-pay | Admitting: Rehabilitative and Restorative Service Providers"

## 2021-09-19 DIAGNOSIS — M542 Cervicalgia: Secondary | ICD-10-CM | POA: Diagnosis not present

## 2021-09-19 DIAGNOSIS — R29898 Other symptoms and signs involving the musculoskeletal system: Secondary | ICD-10-CM

## 2021-09-19 DIAGNOSIS — M6281 Muscle weakness (generalized): Secondary | ICD-10-CM

## 2021-09-19 DIAGNOSIS — R293 Abnormal posture: Secondary | ICD-10-CM

## 2021-09-19 NOTE — Therapy (Signed)
[facility] [place], [place], [address] Phone: [phone]   Fax:  [phone]  Physical Therapy Treatment  Patient Details  Name: Sarah Mullen MRN: [id number] Date of Birth: [birth year] Referring Provider (PT): Dr Kary Kos   Encounter Date: 09/19/2021   PT End of Session - 09/19/21 1115     Visit Number 13    Number of Visits 22    Date for PT Re-Evaluation 10/24/21    PT Start Time 1105    PT Stop Time 1153    PT Time Calculation (min) 48 min    Activity Tolerance Patient tolerated treatment well             Past Medical History:  Diagnosis Date   Allergic rhinitis, cause unspecified    Blood transfusion    iron transfusion- [place]-2009   Chronic headaches    Depression    Diverticulitis    GERD (gastroesophageal reflux disease)    Heart murmur    diag with echo-physiologic murmur; trace MR, trivial, early systolic MR by 35/45/62 echo ([facility])   History of sinus tachycardia    07/04/07 Holter monitor: 69-154, average HR 95 bpm. Occ PACs, 4 runs probable atrial tachycardia   Menometrorrhagia 11/21/2010   Migraines    Pre-diabetes     Past Surgical History:  Procedure Laterality Date   ABDOMINAL HYSTERECTOMY  09/2011   ANTERIOR CERVICAL DECOMP/DISCECTOMY FUSION N/A 03/27/2018   Procedure: Anteroir Cervical Decrompression Fusion - Cervical Three - Cervical Four - Cervical Four - Cervical Five - Cervical Five - Cervical Six;  Surgeon: Kary Kos, MD;  [facility];  Service: Neurosurgery;  Laterality: N/A;  Anteroir Cervical Decrompression Fusion - Cervical Three - Cervical Four - Cervical Four - Cervical Five - Cervical Five - Cervical Six   BREAST BIOPSY     right breast/benign   CERCLAGE REMOVAL  1986   WHEN pregnant   CESAREAN SECTION  1986   1 time   COLONOSCOPY     CYSTOSCOPY  10/17/2011   Procedure: CYSTOSCOPY;  Surgeon: Megan Salon, MD;  Location: [facility];   Service: Gynecology;;   DILATION AND CURETTAGE OF UTERUS     ESOPHAGOGASTRODUODENOSCOPY     TONSILLECTOMY     TOTAL HIP ARTHROPLASTY Right 08/05/2019   Procedure: RIGHT TOTAL HIP ARTHROPLASTY ANTERIOR APPROACH;  Surgeon: Mcarthur Rossetti, MD;  Location: [facility];  Service: Orthopedics;  Laterality: Right;    There were no vitals filed for this visit.   Subjective Assessment - 09/19/21 1116     Subjective Patient reports that she is doing well and using putty for strengthening Rt hand. Working on her exercises at home.    Currently in Pain? No/denies    Pain Score 0-No pain                [facility] PT Assessment - 09/19/21 0001       Assessment   Medical Diagnosis Cervical radiculopathy    Referring Provider (PT) Dr Kary Kos    Onset Date/Surgical Date 05/24/21    Hand Dominance Right    Next MD Visit 09/27/21    Prior Therapy here post cervical fusion      Palpation   Palpation comment muscular tightness noted through Rt > Lt ant/lat/post cervical musculature; pecs; upper trap; leveator  [facility] Adult PT Treatment/Exercise - 09/19/21 0001       Shoulder Exercises: Prone   Other Prone Exercises prone series - scap squeeze 5 sec x 10; goal post x 5 reps; W's x 5 reps      Shoulder Exercises: ROM/Strengthening   UBE (Upper Arm Bike) L4 x 4 min alt fwd/back      Shoulder Exercises: Stretch   Other Shoulder Stretches doorway stretch 30 sec x 2 reps each position      Moist Heat Therapy   Number Minutes Moist Heat 10 Minutes    Moist Heat Location Cervical;Shoulder      Manual Therapy   Manual therapy comments skilled palpation to assess response to DN and manual work    Joint Mobilization Grade II/III PA and lateral mobs upper cervical (fused C4/5/6)    Soft tissue mobilization deep tissue work through the ant/lat/post cervical musculature; upper trap    Myofascial Release upper traps              Trigger Point Dry  Needling - 09/19/21 0001     Consent Given? Yes    Education Handout Provided Previously provided    Upper Trapezius Response Palpable increased muscle length    Scalenes Response Palpable increased muscle length    Cervical multifidi Response Palpable increased muscle length    Thoracic multifidi response Palpable increased muscle length;Twitch response elicited                        PT Long Term Goals - 09/12/21 0952       PT LONG TERM GOAL #1   Title independent with HEP    Time 6    Period Weeks    Status On-going    Target Date 10/24/21      PT LONG TERM GOAL #2   Title verbalize and demonstrate proper posture with posterior shoulder girdle engaged to decrease risk of recurrent cervical problems    Time 6    Period Weeks    Status On-going    Target Date 10/24/21      PT LONG TERM GOAL #3   Title demonstrate increased grip strength by 5 pounds and lateral pinch by 3 pounds    Time 6    Period Weeks    Status Partially Met    Target Date 10/24/21      PT LONG TERM GOAL #4   Title Improve functional limitation score to 59    Time 6    Period Weeks    Status Achieved    Target Date 10/24/21                   Plan - 09/19/21 1118     Clinical Impression Statement Continued improvement in posture and alignment with patient demonstrating improved upright posture. Continued muscular tightness in the cervical and upper  trap musculature. Progressing well toward stated goals of therapy.    Rehab Potential Good    PT Frequency 2x / week    PT Duration 6 weeks    PT Treatment/Interventions ADLs/Self Care Home Management;Aquatic Therapy;Cryotherapy;Electrical Stimulation;Iontophoresis 37m/ml Dexamethasone;Moist Heat;Ultrasound;Therapeutic activities;Therapeutic exercise;Neuromuscular re-education;Patient/family education;Manual techniques;Dry needling;Taping    PT Next Visit Plan review and progress HEP; continue with postural correction/education;  posterior shoulder girdle strengthening; continue DN and manual work; modalities as indicated    PT Home Exercise Plan [email or web address]   Consulted and Agree with Plan of Care Patient  Patient will benefit from skilled therapeutic intervention in order to improve the following deficits and impairments:     Visit Diagnosis: Cervicalgia  Other symptoms and signs involving the musculoskeletal system  Muscle weakness (generalized)  Abnormal posture     Problem List Patient Active Problem List   Diagnosis Date Noted   Diabetes ([place]) 08/16/2021   Status post total replacement of right hip 08/05/2019   Unilateral primary osteoarthritis, right hip 06/23/2019   Nonallopathic lesion of sacral region 06/13/2019   Nonallopathic lesion of lumbosacral region 06/13/2019   Nonallopathic lesion of thoracic region 06/13/2019   Right hip pain 06/13/2019   Left knee pain 01/17/2019   Spinal stenosis in cervical region 03/27/2018   Testosterone deficiency 08/22/2017   Hyperlipidemia 08/22/2017   Chronic lower back pain 08/22/2017   Cervical myelopathy ([place]) 05/15/2016   Vitamin D deficiency 05/15/2016   Fatty liver 03/09/2016   Positive TB test 05/28/2012   GERD (gastroesophageal reflux disease) 11/21/2010   Dyshidrotic eczema 11/21/2010    Mohammed Mcandrew Nilda Simmer, PT, MPH 09/19/2021, 11:40 AM  [facility] [address] [place], [place], [address] Phone: [phone]   Fax:  [phone]  Name: Sarah Mullen MRN: [id number] Date of Birth: [birth year]

## 2021-09-21 ENCOUNTER — Encounter: Payer: Self-pay | Admitting: Rehabilitative and Restorative Service Providers"

## 2021-09-21 ENCOUNTER — Ambulatory Visit
Payer: No Typology Code available for payment source | Attending: Neurosurgery | Admitting: Rehabilitative and Restorative Service Providers"

## 2021-09-21 ENCOUNTER — Other Ambulatory Visit: Payer: Self-pay | Admitting: Internal Medicine

## 2021-09-21 ENCOUNTER — Other Ambulatory Visit: Payer: Self-pay

## 2021-09-21 DIAGNOSIS — M542 Cervicalgia: Secondary | ICD-10-CM | POA: Insufficient documentation

## 2021-09-21 DIAGNOSIS — M6281 Muscle weakness (generalized): Secondary | ICD-10-CM | POA: Insufficient documentation

## 2021-09-21 DIAGNOSIS — R29898 Other symptoms and signs involving the musculoskeletal system: Secondary | ICD-10-CM | POA: Diagnosis present

## 2021-09-21 DIAGNOSIS — R293 Abnormal posture: Secondary | ICD-10-CM | POA: Diagnosis present

## 2021-09-21 NOTE — Patient Instructions (Signed)
Access Code: [email or web address] ?URL: [email or web address] ?Date: 09/21/2021 ?Prepared by: Gillermo Murdoch ? ?Exercises ?Seated Cervical Retraction - 3 x daily - 7 x weekly - 1 sets - 10 reps ?Standing Scapular Retraction - 3 x daily - 7 x weekly - 1 sets - 10 reps - 10 hold ?Shoulder External Rotation and Scapular Retraction - 3 x daily - 7 x weekly - 1 sets - 10 reps - hold ?Shoulder External Rotation in 45 Degrees Abduction - 2 x daily - 7 x weekly - 1-2 sets - 10 reps - 3 sec hold ?Doorway Pec Stretch at 60 Degrees Abduction - 3 x daily - 7 x weekly - 1 sets - 3 reps ?Doorway Pec Stretch at 90 Degrees Abduction - 3 x daily - 7 x weekly - 1 sets - 3 reps - 30 seconds hold ?Doorway Pec Stretch at 120 Degrees Abduction - 3 x daily - 7 x weekly - 1 sets - 3 reps - 30 second hold hold ?Supine Thoracic Mobilization Towel Roll Vertical with Arm Stretch - 2 x daily - 7 x weekly - 1 sets - 1-3 reps - 2-3 min hold ?Shoulder External Rotation and Scapular Retraction with Resistance - 2 x daily - 7 x weekly - 1 sets - 10 reps - 3-5 sec hold ?Standing Bilateral Low Shoulder Row with Anchored Resistance - 2 x daily - 7 x weekly - 1-3 sets - 10 reps - 2-3 sec hold ?Drawing Bow - 1 x daily - 7 x weekly - 1 sets - 10 reps - 3 sec hold ?Gripping Sponge Neutral - 2 x daily - 7 x weekly - 1 sets - 10-20 reps - 5-10 sec hold ?Shoulder External Rotation Reactive Isometrics - 2 x daily - 7 x weekly - 1 sets - 10 reps - 3 sec hold ?Seated Cervical Sidebending AROM - 2 x daily - 7 x weekly - 1 sets - 5 reps - 5-10 sec hold ?Standing Lat Pull Down with Resistance - Elbows Bent - 2 x daily - 7 x weekly - 1 sets - 10 reps - 3 sec hold ?Shoulder Flexion Serratus Activation with Resistance - 1 x daily - 7 x weekly - 1 sets - 10 reps - 3-5 sec hold ?Wall Clock with Theraband - 1 x daily - 7 x weekly - 1 sets - 10 reps - 2-3 sec hold ?Shoulder External Rotation Reactive Isometrics - 2 x daily - 7 x weekly - 1 sets - 10 reps - 3 sec hold ?Prone  Scapular Retraction - 2 x daily - 7 x weekly - 1 sets - 5-10 reps - 3-5 sec hold ?Prone W Scapular Retraction - 2 x daily - 7 x weekly - 1 sets - 5-10 reps - 3-5 sec hold ?Prone Scapular Retraction in Abduction - 2 x daily - 7 x weekly - 1 sets - 5-10 reps - 3-5 sec hold ?Prone Scapular Retraction Y - 2 x daily - 7 x weekly - 1 sets - 3-5 reps - 5-10 sec hold ?Standing Row with Resistance with Anchored Resistance at Chest Height Palms Down - 2 x daily - 7 x weekly - 1-3 sets - 10 reps - 3 sec hold ? ?

## 2021-09-21 NOTE — Therapy (Signed)
[facility] ?[place] ?[place], [place], [address] ?Phone: [phone]   Fax:  [phone] ? ?Physical Therapy Treatment ? ?Patient Details  ?Name: Sarah Mullen ?MRN: [id number] ?Date of Birth: [birth year] ?Referring Provider (PT): Dr Kary Kos ? ? ?Encounter Date: 09/21/2021 ? ? PT End of Session - 09/21/21 0936   ? ? Visit Number 14   ? Number of Visits 22   ? Date for PT Re-Evaluation 10/24/21   ? PT Start Time 7425   ? PT Stop Time 1023   ? PT Time Calculation (min) 48 min   ? Activity Tolerance Patient tolerated treatment well   ? ?  ?  ? ?  ? ? ?Past Medical History:  ?Diagnosis Date  ? Allergic rhinitis, cause unspecified   ? Blood transfusion   ? iron transfusion- [place]-2009  ? Chronic headaches   ? Depression   ? Diverticulitis   ? GERD (gastroesophageal reflux disease)   ? Heart murmur   ? diag with echo-physiologic murmur; trace MR, trivial, early systolic MR by 95/63/87 echo ([facility])  ? History of sinus tachycardia   ? 07/04/07 Holter monitor: 69-154, average HR 95 bpm. Occ PACs, 4 runs probable atrial tachycardia  ? Menometrorrhagia 11/21/2010  ? Migraines   ? Pre-diabetes   ? ? ?Past Surgical History:  ?Procedure Laterality Date  ? ABDOMINAL HYSTERECTOMY  09/2011  ? ANTERIOR CERVICAL DECOMP/DISCECTOMY FUSION N/A 03/27/2018  ? Procedure: Anteroir Cervical Decrompression Fusion - Cervical Three - Cervical Four - Cervical Four - Cervical Five - Cervical Five - Cervical Six;  Surgeon: Kary Kos, MD;  Location: [facility];  Service: Neurosurgery;  Laterality: N/A;  Anteroir Cervical Decrompression Fusion - Cervical Three - Cervical Four - Cervical Four - Cervical Five - Cervical Five - Cervical Six  ? BREAST BIOPSY    ? right breast/benign  ? [place]  ? WHEN pregnant  ? [place]  ? 1 time  ? COLONOSCOPY    ? CYSTOSCOPY  10/17/2011  ? Procedure: CYSTOSCOPY;  Surgeon: Megan Salon, MD;  Location: [facility];   Service: Gynecology;;  ? DILATION AND CURETTAGE OF UTERUS    ? ESOPHAGOGASTRODUODENOSCOPY    ? TONSILLECTOMY    ? TOTAL HIP ARTHROPLASTY Right 08/05/2019  ? Procedure: RIGHT TOTAL HIP ARTHROPLASTY ANTERIOR APPROACH;  Surgeon: Mcarthur Rossetti, MD;  Location: [facility];  Service: Orthopedics;  Laterality: Right;  ? ? ?There were no vitals filed for this visit. ? ? Subjective Assessment - 09/21/21 0936   ? ? Subjective Doing well. No pain. Working on her exercises at home.   ? Currently in Pain? No/denies   ? Pain Score 0-No pain   ? ?  ?  ? ?  ? ? ? ? ? ? ? ? ? ? ? ? ? ? ? ? ? ? ? ? [facility] Adult PT Treatment/Exercise - 09/21/21 0001   ? ?  ? Therapeutic Activites   ? Work Economist work simulation including moving patient up and down in bed; rolling patient side to side; assisting patient to come to sit; bchair to bed/bed to chair transfers; sit to stand/stand to sit transfers   ?  ? Shoulder Exercises: Standing  ? External Rotation Strengthening;Right;Left;10 reps;Theraband   ? Theraband Level (Shoulder External Rotation) Level 4 (Blue)   ? External Rotation Limitations isometric hold 30 sec x 3 reps each side   ? Extension Strengthening;Both;10  reps;Theraband   ? Theraband Level (Shoulder Extension) Level 4 (Blue)   ? Row Strengthening;Both;10 reps;Theraband   ? Theraband Level (Shoulder Row) Level 4 (Blue)   ? Row Limitations bow and arrow x 10 reps each side blue TB; row at 45 degrees x 10 reps x blue   ? Retraction Strengthening;Both;10 reps;Theraband   ? Theraband Level (Shoulder Retraction) Level 2 (Red)   ?  ? Shoulder Exercises: ROM/Strengthening  ? UBE (Upper Arm Bike) L5 x 4 min alt fwd/back   ?  ? Shoulder Exercises: Stretch  ? Other Shoulder Stretches doorway stretch 30 sec x 2 reps each position   ? ?  ?  ? ?  ? ? ? ? ? ? ? ? ? ? PT Education - 09/21/21 0945   ? ? Education Details HEP; reviewed and practiced work tasks preparing to return to work   ? Person(s) Educated Patient   ? Methods  Explanation;Demonstration;Tactile cues;Verbal cues;Handout   ? Comprehension Verbalized understanding;Returned demonstration;Verbal cues required;Tactile cues required   ? ?  ?  ? ?  ? ? ? ? ? ? PT Long Term Goals - 09/12/21 0952   ? ?  ? PT LONG TERM GOAL #1  ? Title independent with HEP   ? Time 6   ? Period Weeks   ? Status On-going   ? Target Date 10/24/21   ?  ? PT LONG TERM GOAL #2  ? Title verbalize and demonstrate proper posture with posterior shoulder girdle engaged to decrease risk of recurrent cervical problems   ? Time 6   ? Period Weeks   ? Status On-going   ? Target Date 10/24/21   ?  ? PT LONG TERM GOAL #3  ? Title demonstrate increased grip strength by 5 pounds and lateral pinch by 3 pounds   ? Time 6   ? Period Weeks   ? Status Partially Met   ? Target Date 10/24/21   ?  ? PT LONG TERM GOAL #4  ? Title Improve functional limitation score to 59   ? Time 6   ? Period Weeks   ? Status Achieved   ? Target Date 10/24/21   ? ?  ?  ? ?  ? ? ? ? ? ? ? ? Plan - 09/21/21 0937   ? ? Clinical Impression Statement No pain. Continued work on postural and grip strengthening. Reviewed and practiced work related tasks in preparation for return to work. Reviewed and progressed with exercise program. Patient returns to MD Tuesday. Reassess ROM and strength at next visit.   ? Rehab Potential Good   ? PT Frequency 2x / week   ? PT Duration 6 weeks   ? PT Treatment/Interventions ADLs/Self Care Home Management;Aquatic Therapy;Cryotherapy;Electrical Stimulation;Iontophoresis 37m/ml Dexamethasone;Moist Heat;Ultrasound;Therapeutic activities;Therapeutic exercise;Neuromuscular re-education;Patient/family education;Manual techniques;Dry needling;Taping   ? PT Next Visit Plan review and progress HEP; continue with postural correction/education; posterior shoulder girdle strengthening; continue DN and manual work; modalities as indicated   ? PT Home Exercise Plan [email or web address]   ? Consulted and Agree with Plan of Care Patient   ? ?   ?  ? ?  ? ? ?Patient will benefit from skilled therapeutic intervention in order to improve the following deficits and impairments:    ? ?Visit Diagnosis: ?Cervicalgia ? ?Other symptoms and signs involving the musculoskeletal system ? ?Muscle weakness (generalized) ? ?Abnormal posture ? ? ? ? ?Problem List ?Patient Active Problem List  ? Diagnosis Date Noted  ?  Diabetes ([place]) 08/16/2021  ? Status post total replacement of right hip 08/05/2019  ? Unilateral primary osteoarthritis, right hip 06/23/2019  ? Nonallopathic lesion of sacral region 06/13/2019  ? Nonallopathic lesion of lumbosacral region 06/13/2019  ? Nonallopathic lesion of thoracic region 06/13/2019  ? Right hip pain 06/13/2019  ? Left knee pain 01/17/2019  ? Spinal stenosis in cervical region 03/27/2018  ? Testosterone deficiency 08/22/2017  ? Hyperlipidemia 08/22/2017  ? Chronic lower back pain 08/22/2017  ? Cervical myelopathy ([place]) 05/15/2016  ? Vitamin D deficiency 05/15/2016  ? Fatty liver 03/09/2016  ? Positive TB test 05/28/2012  ? GERD (gastroesophageal reflux disease) 11/21/2010  ? Dyshidrotic eczema 11/21/2010  ? ? ?Everardo All, PT, MPH ?09/21/2021, 11:31 AM ? ?[facility] ?[facility] ?[place] ?[place], [place], [address] ?Phone: [phone]   Fax:  [phone] ? ?Name: Micaella Gitto Trine ?MRN: [id number] ?Date of Birth: 1955/10/29 ? ? ? ?

## 2021-09-26 ENCOUNTER — Ambulatory Visit: Payer: No Typology Code available for payment source | Admitting: Rehabilitative and Restorative Service Providers"

## 2021-09-26 ENCOUNTER — Other Ambulatory Visit: Payer: Self-pay

## 2021-09-26 ENCOUNTER — Encounter: Payer: Self-pay | Admitting: Rehabilitative and Restorative Service Providers"

## 2021-09-26 DIAGNOSIS — R29898 Other symptoms and signs involving the musculoskeletal system: Secondary | ICD-10-CM

## 2021-09-26 DIAGNOSIS — R293 Abnormal posture: Secondary | ICD-10-CM

## 2021-09-26 DIAGNOSIS — M542 Cervicalgia: Secondary | ICD-10-CM | POA: Diagnosis not present

## 2021-09-26 DIAGNOSIS — M6281 Muscle weakness (generalized): Secondary | ICD-10-CM

## 2021-09-26 NOTE — Therapy (Signed)
[place], [place], [address] Phone: [phone]   Fax:  [phone]  Physical Therapy Treatment, Progress Note and Discharge Summary  PHYSICAL THERAPY DISCHARGE SUMMARY  Visits from Start of Care: 15  Current functional level related to goals / functional outcomes: See progress note for progress and discharge status    Remaining deficits: Continued weakness Rt hand compared to Lt but good gains    Education / Equipment: HEP    Patient agrees to discharge. Patient goals were met. Patient is being discharged due to meeting the stated rehab goals.   Patient Details  Name: Sarah Mullen MRN: [id number] Date of Birth: Jan 16, 1956 Referring Provider (PT): Dr Kary Kos   Encounter Date: 09/26/2021   PT End of Session - 09/26/21 0939     Visit Number 15    Number of Visits 22    Date for PT Re-Evaluation 10/24/21    PT Start Time 0936    PT Stop Time 1016    PT Time Calculation (min) 40 min    Activity Tolerance Patient tolerated treatment well             Past Medical History:  Diagnosis Date   Allergic rhinitis, cause unspecified    Blood transfusion    iron transfusion- [place]-2009   Chronic headaches    Depression    Diverticulitis    GERD (gastroesophageal reflux disease)    Heart murmur    diag with echo-physiologic murmur; trace MR, trivial, early systolic MR by 65/99/35 echo ([facility])   History of sinus tachycardia    07/04/07 Holter monitor: 69-154, average HR 95 bpm. Occ PACs, 4 runs probable atrial tachycardia   Menometrorrhagia 11/21/2010   Migraines    Pre-diabetes     Past Surgical History:  Procedure Laterality Date   ABDOMINAL HYSTERECTOMY  09/2011   ANTERIOR CERVICAL DECOMP/DISCECTOMY FUSION N/A 03/27/2018   Procedure: Anteroir Cervical Decrompression Fusion - Cervical Three - Cervical Four - Cervical Four - Cervical Five - Cervical Five - Cervical  Six;  Surgeon: Kary Kos, MD;  Location: [facility];  Service: Neurosurgery;  Laterality: N/A;  Anteroir Cervical Decrompression Fusion - Cervical Three - Cervical Four - Cervical Four - Cervical Five - Cervical Five - Cervical Six   BREAST BIOPSY     right breast/benign   CERCLAGE REMOVAL  1986   WHEN pregnant   CESAREAN SECTION  1986   1 time   COLONOSCOPY     CYSTOSCOPY  10/17/2011   Procedure: CYSTOSCOPY;  Surgeon: Megan Salon, MD;  Location: [facility];  Service: Gynecology;;   DILATION AND CURETTAGE OF UTERUS     ESOPHAGOGASTRODUODENOSCOPY     TONSILLECTOMY     TOTAL HIP ARTHROPLASTY Right 08/05/2019   Procedure: RIGHT TOTAL HIP ARTHROPLASTY ANTERIOR APPROACH;  Surgeon: Mcarthur Rossetti, MD;  Location: [facility];  Service: Orthopedics;  Laterality: Right;    There were no vitals filed for this visit.   Subjective Assessment - 09/26/21 0940     Subjective Doing good. No pain. Working on her exercises at home. Feels she is getting stronger and grip is improving. Comfortable with continuing with independent HEP. Returne to MD tomorrow.    Currently in Pain? No/denies    Pain Score 0-No pain                [facility] PT Assessment - 09/26/21 0001       Assessment  Medical Diagnosis Cervical radiculopathy    Referring Provider (PT) Dr Kary Kos    Onset Date/Surgical Date 05/24/21    Hand Dominance Right    Next MD Visit 09/27/21    Prior Therapy here post cervical fusion      Observation/Other Assessments   Focus on Therapeutic Outcomes (FOTO)  68      AROM   Cervical Flexion 64    Cervical Extension 63    Cervical - Right Side Bend 38    Cervical - Left Side Bend 45    Cervical - Right Rotation 67    Cervical - Left Rotation 72      Strength   Right Shoulder Flexion 5/5    Right Shoulder ABduction 5/5    Right Shoulder External Rotation 5/5    Left Shoulder Flexion 5/5    Left Shoulder ABduction 5/5    Left Shoulder External Rotation 5/5    Right Hand Grip (lbs)  57    Right Hand Lateral Pinch 6 lbs    Left Hand Grip (lbs) 65    Left Hand Lateral Pinch 10 lbs      Palpation   Palpation comment minimal muscular tightness noted through Rt > Lt ant/lat/post cervical musculature; pecs; upper trap; leveator                           [facility] Adult PT Treatment/Exercise - 09/26/21 0001       Therapeutic Activites    Work Economist review of work simulation including moving patient up and down in bed; rolling patient side to side; assisting patient to come to sit; bchair to bed/bed to chair transfers; sit to stand/stand to sit transfers      Neck Exercises: Seated   Neck Retraction 5 reps;5 secs    Lateral Flexion Right;Left;5 reps    Lateral Flexion Limitations 5-10 sec hold    W Back 10 reps    W Back Limitations L's x 10    Shoulder Rolls Backwards;10 reps      Shoulder Exercises: Prone   Other Prone Exercises prone series - scap squeeze 5 sec x 10; goal post x 5 reps; W's x 5 reps      Shoulder Exercises: Standing   External Rotation Strengthening;Right;Left;10 reps;Theraband    Theraband Level (Shoulder External Rotation) Level 4 (Blue)    External Rotation Limitations isometric hold 30 sec x 3 reps each side    Extension Strengthening;Both;10 reps;Theraband    Theraband Level (Shoulder Extension) Level 4 (Blue)    Row Strengthening;Both;10 reps;Theraband    Theraband Level (Shoulder Row) Level 4 (Blue)    Row Limitations bow and arrow x 10 reps each side blue TB; row at 45 degrees x 10 reps x blue    Retraction Strengthening;Both;10 reps;Theraband    Theraband Level (Shoulder Retraction) Level 2 (Red)      Shoulder Exercises: ROM/Strengthening   UBE (Upper Arm Bike) L5 x 4 min alt fwd/back      Shoulder Exercises: Stretch   Other Shoulder Stretches doorway stretch 30 sec x 2 reps each position      Moist Heat Therapy   Number Minutes Moist Heat 10 Minutes    Moist Heat Location Cervical;Shoulder      Manual  Therapy   Soft tissue mobilization deep tissue work through the ant/lat/post cervical musculature; upper trap    Passive ROM cervical stretch into flexion; flexion with slight rotation/lateral flexion; lateral flexion  PT Long Term Goals - 09/26/21 0943       PT LONG TERM GOAL #1   Title independent with HEP    Time 6    Period Weeks    Status Achieved    Target Date 10/24/21      PT LONG TERM GOAL #2   Title verbalize and demonstrate proper posture with posterior shoulder girdle engaged to decrease risk of recurrent cervical problems    Time 6    Period Weeks    Status Achieved    Target Date 10/24/21      PT LONG TERM GOAL #3   Title demonstrate increased grip strength by 5 pounds and lateral pinch by 3 pounds    Time 6    Period Weeks    Status Achieved    Target Date 10/24/21      PT LONG TERM GOAL #4   Title Improve functional limitation score to 59    Time 6    Period Weeks    Status Achieved    Target Date 10/24/21                   Plan - 09/26/21 0941     Clinical Impression Statement Excellent progress with resolution of pain in the neck and shoudler area. Review of HEP and work tasks. Strength is gradually improving. Grip strength is increasing. Goals are accomplished and patient will continue with independent HEP. She will call with any concerns or questions.    Rehab Potential Good    PT Frequency 2x / week    PT Duration 6 weeks    PT Treatment/Interventions ADLs/Self Care Home Management;Aquatic Therapy;Cryotherapy;Electrical Stimulation;Iontophoresis 35m/ml Dexamethasone;Moist Heat;Ultrasound;Therapeutic activities;Therapeutic exercise;Neuromuscular re-education;Patient/family education;Manual techniques;Dry needling;Taping    PT Next Visit Plan continue with independent HEP d/c PT at this time    PT Home Exercise Plan [id number]   Consulted and Agree with Plan of Care Patient             Patient  will benefit from skilled therapeutic intervention in order to improve the following deficits and impairments:     Visit Diagnosis: Cervicalgia  Other symptoms and signs involving the musculoskeletal system  Muscle weakness (generalized)  Abnormal posture     Problem List Patient Active Problem List   Diagnosis Date Noted   Diabetes ([place] 08/16/2021   Status post total replacement of right hip 08/05/2019   Unilateral primary osteoarthritis, right hip 06/23/2019   Nonallopathic lesion of sacral region 06/13/2019   Nonallopathic lesion of lumbosacral region 06/13/2019   Nonallopathic lesion of thoracic region 06/13/2019   Right hip pain 06/13/2019   Left knee pain 01/17/2019   Spinal stenosis in cervical region 03/27/2018   Testosterone deficiency 08/22/2017   Hyperlipidemia 08/22/2017   Chronic lower back pain 08/22/2017   [place]ical myelopathy ([place] 05/15/2016   Vitamin D deficiency 05/15/2016   Fatty liver 03/09/2016   Positive TB test 05/28/2012   GERD (gastroesophageal reflux disease) 11/21/2010   Dyshidrotic eczema 11/21/2010    Phenix Vandermeulen PNilda Simmer PT, MPH  09/26/2021, 10:12 AM  [facility] [place] [address]Phone: [phone]  Fax:  [phone] Name: Sarah SCHLEICHERMRN: [id number] of Birth: 112-07-1955

## 2021-10-06 ENCOUNTER — Encounter: Payer: Self-pay | Admitting: Internal Medicine

## 2021-10-06 ENCOUNTER — Other Ambulatory Visit: Payer: Self-pay

## 2021-10-06 ENCOUNTER — Other Ambulatory Visit (HOSPITAL_COMMUNITY): Payer: Self-pay

## 2021-10-06 MED ORDER — METFORMIN HCL 500 MG PO TABS
500.0000 mg | ORAL_TABLET | Freq: Two times a day (BID) | ORAL | 1 refills | Status: DC
  Filled 2021-10-06: qty 120, 60d supply, fill #0

## 2021-10-07 ENCOUNTER — Other Ambulatory Visit (HOSPITAL_COMMUNITY): Payer: Self-pay

## 2021-10-24 ENCOUNTER — Other Ambulatory Visit (HOSPITAL_COMMUNITY): Payer: Self-pay

## 2021-11-11 ENCOUNTER — Encounter: Payer: Self-pay | Admitting: Internal Medicine

## 2021-11-13 MED ORDER — OZEMPIC (0.25 OR 0.5 MG/DOSE) 2 MG/1.5ML ~~LOC~~ SOPN: 0.5000 mg | PEN_INJECTOR | SUBCUTANEOUS | 0 refills | Status: DC

## 2021-11-14 ENCOUNTER — Other Ambulatory Visit: Payer: Self-pay

## 2021-11-14 ENCOUNTER — Other Ambulatory Visit (HOSPITAL_COMMUNITY): Payer: Self-pay

## 2021-11-14 MED ORDER — METFORMIN HCL 500 MG PO TABS
500.0000 mg | ORAL_TABLET | Freq: Two times a day (BID) | ORAL | 1 refills | Status: DC
  Filled 2021-11-14: qty 120, 60d supply, fill #0

## 2021-11-14 MED ORDER — SEMAGLUTIDE(0.25 OR 0.5MG/DOS) 2 MG/3ML ~~LOC~~ SOPN
0.5000 mg | PEN_INJECTOR | SUBCUTANEOUS | 0 refills | Status: DC
Start: 2021-11-14 — End: 2022-02-09
  Filled 2021-11-14 – 2021-12-30 (×2): qty 3, 28d supply, fill #0

## 2021-11-18 ENCOUNTER — Other Ambulatory Visit (HOSPITAL_COMMUNITY): Payer: Self-pay

## 2021-12-30 ENCOUNTER — Other Ambulatory Visit (HOSPITAL_COMMUNITY): Payer: Self-pay

## 2022-01-02 ENCOUNTER — Other Ambulatory Visit (HOSPITAL_COMMUNITY): Payer: Self-pay

## 2022-01-06 ENCOUNTER — Other Ambulatory Visit (HOSPITAL_BASED_OUTPATIENT_CLINIC_OR_DEPARTMENT_OTHER): Payer: Self-pay | Admitting: Obstetrics & Gynecology

## 2022-01-06 ENCOUNTER — Other Ambulatory Visit (HOSPITAL_COMMUNITY): Payer: Self-pay

## 2022-01-06 MED ORDER — NONFORMULARY OR COMPOUNDED ITEM: 1 refills | Status: DC

## 2022-01-09 ENCOUNTER — Telehealth: Payer: Self-pay

## 2022-01-09 NOTE — Telephone Encounter (Signed)
Patient called regarding Rx and not being able to get a refill. I called her and recommended she call Dr. Hale Mullen office and provided phone number. She has not seen anyone at [facility] previously.

## 2022-01-11 ENCOUNTER — Ambulatory Visit: Payer: No Typology Code available for payment source | Admitting: Internal Medicine

## 2022-01-16 NOTE — Progress Notes (Signed)
Subjective:    Patient ID: Sarah Mullen, female    DOB: [birth year], 66 y.o.   MRN: [id number]     HPI Tenise is here for follow up of her chronic medical problems, including DM, hld, gerd, chronic back pain   She has lost weight.  Her energy level is better.      Medications and allergies reviewed with patient and updated if appropriate.  Current Outpatient Medications on File Prior to Visit  Medication Sig Dispense Refill   aspirin 81 MG chewable tablet Chew 1 tablet (81 mg total) by mouth 2 (two) times daily. 30 tablet 0   aspirin-acetaminophen-caffeine (EXCEDRIN MIGRAINE) 250-250-65 MG tablet Take 2 tablets by mouth daily as needed for headache or migraine.     cetirizine (ZYRTEC) 10 MG tablet Take 10 mg by mouth daily.     NONFORMULARY OR COMPOUNDED ITEM Estradiol cream 0.02% with testosterone 1% cream, Apply 1/2 ml vaginally as directed.  #45 ml grams 1 each 1   pantoprazole (PROTONIX) 40 MG tablet Take 1 tablet (40 mg total) by mouth daily. 90 tablet 1   Semaglutide,0.25 or 0.5MG /DOS, 2 MG/3ML SOPN Inject 0.5 mg into the skin once a week. 3 mL 0   triamcinolone cream (KENALOG) 0.1 % Apply 1 application topically 2 (two) times daily. 30 g 2   [DISCONTINUED] progesterone (PROMETRIUM) 200 MG capsule Take 200 mg by mouth daily.       No current facility-administered medications on file prior to visit.     Review of Systems  Constitutional:  Negative for fever.  Respiratory:  Negative for cough, shortness of breath and wheezing.   Cardiovascular:  Negative for chest pain, palpitations and leg swelling.  Neurological:  Positive for light-headedness (occ- transient). Negative for headaches.       Objective:   Vitals:   01/17/22 0910  BP: 120/68  Pulse: 81  Temp: 98.3 F (36.8 C)  SpO2: 90%   BP Readings from Last 3 Encounters:  01/17/22 120/68  08/04/21 (!) 149/80  07/13/21 120/80   Wt Readings from Last 3 Encounters:  01/17/22 164 lb (74.4 kg)  08/04/21  176 lb 6.4 oz (80 kg)  07/13/21 174 lb (78.9 kg)   Body mass index is 30.99 kg/m.    Physical Exam Constitutional:      General: She is not in acute distress.    Appearance: Normal appearance.  HENT:     Head: Normocephalic and atraumatic.  Eyes:     Conjunctiva/sclera: Conjunctivae normal.  Cardiovascular:     Rate and Rhythm: Normal rate and regular rhythm.     Heart sounds: Normal heart sounds. No murmur heard. Pulmonary:     Effort: Pulmonary effort is normal. No respiratory distress.     Breath sounds: Normal breath sounds. No wheezing.  Musculoskeletal:     Cervical back: Neck supple.     Right lower leg: No edema.     Left lower leg: No edema.  Lymphadenopathy:     Cervical: No cervical adenopathy.  Skin:    General: Skin is warm and dry.     Findings: No rash.  Neurological:     Mental Status: She is alert. Mental status is at baseline.  Psychiatric:        Mood and Affect: Mood normal.        Behavior: Behavior normal.        Lab Results  Component Value Date   WBC 8.0 07/12/2021  HGB 11.6 (L) 07/12/2021   HCT 36.7 07/12/2021   PLT 303.0 07/12/2021   GLUCOSE 106 (H) 07/12/2021   CHOL 162 07/12/2021   TRIG 94.0 07/12/2021   HDL 50.40 07/12/2021   LDLDIRECT 149.3 05/22/2012   LDLCALC 93 07/12/2021   ALT 53 (H) 07/12/2021   AST 38 (H) 07/12/2021   NA 139 07/12/2021   K 3.9 07/12/2021   CL 104 07/12/2021   CREATININE 0.62 07/12/2021   BUN 24 (H) 07/12/2021   CO2 29 07/12/2021   TSH 0.77 07/12/2021   HGBA1C 7.5 (H) 07/12/2021     Assessment & Plan:    See Problem List for Assessment and Plan of chronic medical problems.

## 2022-01-17 ENCOUNTER — Encounter: Payer: Self-pay | Admitting: Internal Medicine

## 2022-01-17 ENCOUNTER — Other Ambulatory Visit (HOSPITAL_COMMUNITY): Payer: Self-pay

## 2022-01-17 ENCOUNTER — Ambulatory Visit (INDEPENDENT_AMBULATORY_CARE_PROVIDER_SITE_OTHER): Payer: No Typology Code available for payment source | Admitting: Internal Medicine

## 2022-01-17 VITALS — BP 120/68 | HR 81 | Temp 98.3°F | Ht 61.0 in | Wt 164.0 lb

## 2022-01-17 DIAGNOSIS — E7849 Other hyperlipidemia: Secondary | ICD-10-CM | POA: Diagnosis not present

## 2022-01-17 DIAGNOSIS — E119 Type 2 diabetes mellitus without complications: Secondary | ICD-10-CM | POA: Diagnosis not present

## 2022-01-17 DIAGNOSIS — G8929 Other chronic pain: Secondary | ICD-10-CM

## 2022-01-17 DIAGNOSIS — E559 Vitamin D deficiency, unspecified: Secondary | ICD-10-CM | POA: Diagnosis not present

## 2022-01-17 DIAGNOSIS — K219 Gastro-esophageal reflux disease without esophagitis: Secondary | ICD-10-CM

## 2022-01-17 DIAGNOSIS — M545 Low back pain, unspecified: Secondary | ICD-10-CM | POA: Diagnosis not present

## 2022-01-17 LAB — LIPID PANEL
Cholesterol: 182 mg/dL (ref 0–200)
HDL: 49.1 mg/dL (ref >39.00)
LDL Cholesterol: 120 mg/dL — ABNORMAL HIGH (ref 0–99)
NonHDL: 133.19
Total CHOL/HDL Ratio: 4
Triglycerides: 64 mg/dL (ref 0.0–149.0)
VLDL: 12.8 mg/dL (ref 0.0–40.0)

## 2022-01-17 LAB — COMPREHENSIVE METABOLIC PANEL
ALT: 26 U/L (ref 0–35)
AST: 22 U/L (ref 0–37)
Albumin: 4.4 g/dL (ref 3.5–5.2)
Alkaline Phosphatase: 98 U/L (ref 39–117)
BUN: 29 mg/dL — ABNORMAL HIGH (ref 6–23)
CO2: 28 mEq/L (ref 19–32)
Calcium: 9.8 mg/dL (ref 8.4–10.5)
Chloride: 103 mEq/L (ref 96–112)
Creatinine, Ser: 0.73 mg/dL (ref 0.40–1.20)
GFR: 85.61 mL/min (ref >60.00)
Glucose, Bld: 99 mg/dL (ref 70–99)
Potassium: 4 mEq/L (ref 3.5–5.1)
Sodium: 137 mEq/L (ref 135–145)
Total Bilirubin: 0.4 mg/dL (ref 0.2–1.2)
Total Protein: 7.3 g/dL (ref 6.0–8.3)

## 2022-01-17 LAB — HEMOGLOBIN A1C: Hgb A1c MFr Bld: 6.6 % — ABNORMAL HIGH (ref 4.6–6.5)

## 2022-01-17 LAB — VITAMIN D 25 HYDROXY (VIT D DEFICIENCY, FRACTURES): VITD: 24.18 ng/mL — ABNORMAL LOW (ref 30.00–100.00)

## 2022-01-17 LAB — MICROALBUMIN / CREATININE URINE RATIO
Creatinine,U: 74.3 mg/dL
Microalb Creat Ratio: 0.9 mg/g (ref 0.0–30.0)
Microalb, Ur: <0.7 mg/dL (ref 0.0–1.9)

## 2022-01-17 MED ORDER — DULOXETINE HCL 60 MG PO CPEP
60.0000 mg | ORAL_CAPSULE | Freq: Every day | ORAL | 1 refills | Status: DC
  Filled 2022-01-17: qty 90, 90d supply, fill #0
  Filled 2022-04-21: qty 90, 90d supply, fill #1

## 2022-01-17 MED ORDER — ROSUVASTATIN CALCIUM 5 MG PO TABS
5.0000 mg | ORAL_TABLET | ORAL | 3 refills | Status: AC
Start: 2022-01-18 — End: ?
  Filled 2022-01-17: qty 13, 30d supply, fill #0
  Filled 2022-03-07: qty 13, 30d supply, fill #1
  Filled 2022-04-10: qty 13, 30d supply, fill #2

## 2022-01-17 NOTE — Assessment & Plan Note (Signed)
Chronic Continue Cymbalta 60 mg daily

## 2022-01-17 NOTE — Assessment & Plan Note (Addendum)
Chronic Regular exercise and healthy diet encouraged Check lipid panel  Discussed starting statin - agrees to start crestor 5 mg three times a week

## 2022-01-18 NOTE — Addendum Note (Signed)
Addended by: Binnie Rail on: 01/18/2022 07:47 PM   Modules accepted: Orders

## 2022-02-09 ENCOUNTER — Other Ambulatory Visit (HOSPITAL_COMMUNITY): Payer: Self-pay

## 2022-02-09 ENCOUNTER — Other Ambulatory Visit: Payer: Self-pay | Admitting: Internal Medicine

## 2022-02-10 ENCOUNTER — Other Ambulatory Visit (HOSPITAL_COMMUNITY): Payer: Self-pay

## 2022-02-10 MED ORDER — OZEMPIC (0.25 OR 0.5 MG/DOSE) 2 MG/3ML ~~LOC~~ SOPN
0.5000 mg | PEN_INJECTOR | SUBCUTANEOUS | 0 refills | Status: DC
  Filled 2022-02-10: qty 3, 28d supply, fill #0

## 2022-03-07 ENCOUNTER — Other Ambulatory Visit (HOSPITAL_COMMUNITY): Payer: Self-pay

## 2022-03-17 ENCOUNTER — Other Ambulatory Visit (HOSPITAL_COMMUNITY): Payer: Self-pay

## 2022-03-17 ENCOUNTER — Other Ambulatory Visit: Payer: Self-pay | Admitting: Internal Medicine

## 2022-03-17 ENCOUNTER — Encounter: Payer: Self-pay | Admitting: Internal Medicine

## 2022-03-17 MED ORDER — OZEMPIC (0.25 OR 0.5 MG/DOSE) 2 MG/3ML ~~LOC~~ SOPN
0.5000 mg | PEN_INJECTOR | SUBCUTANEOUS | 0 refills | Status: DC
  Filled 2022-03-17: qty 3, 28d supply, fill #0

## 2022-03-30 ENCOUNTER — Encounter: Payer: Self-pay | Admitting: Sports Medicine

## 2022-04-10 ENCOUNTER — Other Ambulatory Visit (HOSPITAL_COMMUNITY): Payer: Self-pay

## 2022-04-10 ENCOUNTER — Other Ambulatory Visit: Payer: Self-pay | Admitting: Internal Medicine

## 2022-04-10 ENCOUNTER — Encounter: Payer: Self-pay | Admitting: Internal Medicine

## 2022-04-10 MED ORDER — PANTOPRAZOLE SODIUM 40 MG PO TBEC
40.0000 mg | DELAYED_RELEASE_TABLET | Freq: Every day | ORAL | 1 refills | Status: DC
  Filled 2022-04-10: qty 90, 90d supply, fill #0

## 2022-04-13 ENCOUNTER — Other Ambulatory Visit (INDEPENDENT_AMBULATORY_CARE_PROVIDER_SITE_OTHER): Payer: No Typology Code available for payment source

## 2022-04-13 DIAGNOSIS — E7849 Other hyperlipidemia: Secondary | ICD-10-CM | POA: Diagnosis not present

## 2022-04-13 LAB — HEPATIC FUNCTION PANEL
ALT: 23 U/L (ref 0–35)
AST: 19 U/L (ref 0–37)
Albumin: 4 g/dL (ref 3.5–5.2)
Alkaline Phosphatase: 104 U/L (ref 39–117)
Bilirubin, Direct: 0.1 mg/dL (ref 0.0–0.3)
Total Bilirubin: 0.3 mg/dL (ref 0.2–1.2)
Total Protein: 7 g/dL (ref 6.0–8.3)

## 2022-04-13 LAB — LIPID PANEL
Cholesterol: 149 mg/dL (ref 0–200)
HDL: 53.2 mg/dL (ref >39.00)
LDL Cholesterol: 79 mg/dL (ref 0–99)
NonHDL: 95.96
Total CHOL/HDL Ratio: 3
Triglycerides: 85 mg/dL (ref 0.0–149.0)
VLDL: 17 mg/dL (ref 0.0–40.0)

## 2022-04-21 ENCOUNTER — Other Ambulatory Visit: Payer: Self-pay | Admitting: Internal Medicine

## 2022-04-21 ENCOUNTER — Other Ambulatory Visit (HOSPITAL_COMMUNITY): Payer: Self-pay

## 2022-04-26 ENCOUNTER — Other Ambulatory Visit (HOSPITAL_COMMUNITY): Payer: Self-pay

## 2022-04-26 MED ORDER — OZEMPIC (0.25 OR 0.5 MG/DOSE) 2 MG/3ML ~~LOC~~ SOPN
0.5000 mg | PEN_INJECTOR | SUBCUTANEOUS | 0 refills | Status: DC
  Filled 2022-04-26: qty 3, 28d supply, fill #0

## 2022-05-01 ENCOUNTER — Other Ambulatory Visit (HOSPITAL_COMMUNITY): Payer: Self-pay

## 2022-07-13 ENCOUNTER — Encounter: Payer: No Typology Code available for payment source | Admitting: Internal Medicine

## 2022-08-11 ENCOUNTER — Encounter (HOSPITAL_BASED_OUTPATIENT_CLINIC_OR_DEPARTMENT_OTHER): Payer: Self-pay | Admitting: Obstetrics & Gynecology

## 2022-08-11 ENCOUNTER — Ambulatory Visit (INDEPENDENT_AMBULATORY_CARE_PROVIDER_SITE_OTHER): Payer: 59 | Admitting: Obstetrics & Gynecology

## 2022-08-11 VITALS — BP 128/81 | HR 79 | Ht 60.25 in | Wt 168.8 lb

## 2022-08-11 DIAGNOSIS — Z90710 Acquired absence of both cervix and uterus: Secondary | ICD-10-CM | POA: Diagnosis not present

## 2022-08-11 DIAGNOSIS — Z01419 Encounter for gynecological examination (general) (routine) without abnormal findings: Secondary | ICD-10-CM | POA: Diagnosis not present

## 2022-08-11 DIAGNOSIS — Z780 Asymptomatic menopausal state: Secondary | ICD-10-CM | POA: Diagnosis not present

## 2022-08-11 MED ORDER — NONFORMULARY OR COMPOUNDED ITEM: 1 refills | Status: DC

## 2022-08-11 NOTE — Progress Notes (Signed)
[name] here for annual exam.  Still working full time at [employer].  Hasn't decided about retirement at this point.  Had neck and right arm issues.  Was out of work some about a year ago.  Is better but does have some chronic numbness in fingers.    Denies vaginal bleeding.   Ran out of her vaginal estradiol/testosterone cream.  Needs RF.  Hasn't done MMG.  Knows she needs to have this done.     Patient's last menstrual period was 09/24/2011.          Sexually active: significant other having some issues, so no The current method of family planning is status post hysterectomy.    Smoker:  no  Health Maintenance: Pap:  not indicated History of abnormal Pap:  no MMG:  05/30/2021 Colonoscopy:  07/16/2018, follow up 10 years BMD:   09/17/2021 Screening Labs: does with Dr. Quay Burow   reports that she quit smoking about 20 years ago. Her smoking use included cigarettes. She has never used smokeless tobacco. She reports that she does not drink alcohol and does not use drugs.  Past Medical History:  Diagnosis Date   Allergic rhinitis, cause unspecified    Blood transfusion    iron transfusion- [place]-2009   Chronic headaches    Depression    Diabetes (HCC)    Diverticulitis    GERD (gastroesophageal reflux disease)    Heart murmur    diag with echo-physiologic murmur; trace MR, trivial, early systolic MR by 01/21/15 echo ([facility])   History of sinus tachycardia    07/04/07 Holter monitor: 69-154, average HR 95 bpm. Occ PACs, 4 runs probable atrial tachycardia   Menometrorrhagia 11/21/2010   Migraines     Past Surgical History:  Procedure Laterality Date   ABDOMINAL HYSTERECTOMY  09/2011   ANTERIOR CERVICAL DECOMP/DISCECTOMY FUSION N/A 03/27/2018   Procedure: Anteroir Cervical Decrompression Fusion - Cervical Three - Cervical Four - Cervical Four - Cervical Five - Cervical Five - Cervical Six;  Surgeon: Kary Kos, MD;  Location: [facility];   Service: Neurosurgery;  Laterality: N/A;  Anteroir Cervical Decrompression Fusion - Cervical Three - Cervical Four - Cervical Four - Cervical Five - Cervical Five - Cervical Six   BREAST BIOPSY     right breast/benign   CERCLAGE REMOVAL  1986   WHEN pregnant   CESAREAN SECTION  1986   1 time   COLONOSCOPY     CYSTOSCOPY  10/17/2011   Procedure: CYSTOSCOPY;  Surgeon: Megan Salon, MD;  Location: [facility];  Service: Gynecology;;   DILATION AND CURETTAGE OF UTERUS     ESOPHAGOGASTRODUODENOSCOPY     TONSILLECTOMY     TOTAL HIP ARTHROPLASTY Right 08/05/2019   Procedure: RIGHT TOTAL HIP ARTHROPLASTY ANTERIOR APPROACH;  Surgeon: Mcarthur Rossetti, MD;  Location: [facility];  Service: Orthopedics;  Laterality: Right;    Current Outpatient Medications  Medication Sig Dispense Refill   aspirin 81 MG chewable tablet Chew 1 tablet (81 mg total) by mouth 2 (two) times daily. 30 tablet 0   cetirizine (ZYRTEC) 10 MG tablet Take 10 mg by mouth daily.     DULoxetine (CYMBALTA) 60 MG capsule Take 1 capsule (60 mg total) by mouth daily. 90 capsule 1   pantoprazole (PROTONIX) 40 MG tablet Take 1 tablet (40 mg total) by mouth daily. 90 tablet 1   triamcinolone cream (KENALOG) 0.1 % Apply 1 application topically 2 (two) times daily.  30 g 2   NONFORMULARY OR COMPOUNDED ITEM Estradiol cream 0.02% with testosterone 1% cream, Apply 1/2 ml vaginally as directed.  #45 ml grams 1 each 1   rosuvastatin (CRESTOR) 5 MG tablet Take 1 tablet (5 mg total) by mouth 3 (three) times a week. (Patient not taking: Reported on 08/11/2022) 13 tablet 3   Semaglutide,0.25 or 0.'5MG'$ /DOS, (OZEMPIC, 0.25 OR 0.5 MG/DOSE,) 2 MG/3ML SOPN Inject 0.5 mg into the skin once a week. (Patient not taking: Reported on 08/11/2022) 3 mL 0   No current facility-administered medications for this visit.    Family History  Problem Relation Age of Onset   Arthritis Mother    Hyperlipidemia Mother    Heart disease Mother    Arthritis Father    Lung  cancer Father        smoker   Heart disease Father    Hypertension Father    Diabetes Father    Diabetes Sister    Heart disease Sister    Metabolic syndrome Sister    Diabetes Brother    Colon cancer Paternal Aunt    Kidney disease Maternal Grandmother        ESRD on HD   Kidney disease Maternal Grandfather        ESRD on HD   CAD Paternal Grandmother    Stomach cancer Neg Hx    Rectal cancer Neg Hx    Esophageal cancer Neg Hx     ROS: Constitutional: negative Genitourinary:negative  Exam:   BP 128/81   Pulse 79   Ht 5' 0.25" (1.53 m)   Wt 168 lb 12.8 oz (76.6 kg)   LMP 09/24/2011   BMI 32.69 kg/m   Height: 5' 0.25" (153 cm)  General appearance: alert, cooperative and appears stated age Head: Normocephalic, without obvious abnormality, atraumatic Neck: no adenopathy, supple, symmetrical, trachea midline and thyroid normal to inspection and palpation Lungs: clear to auscultation bilaterally Breasts: normal appearance, no masses or tenderness Heart: regular rate and rhythm Abdomen: soft, non-tender; bowel sounds normal; no masses,  no organomegaly Extremities: extremities normal, atraumatic, no cyanosis or edema Skin: Skin color, texture, turgor normal. No rashes or lesions Lymph nodes: Cervical, supraclavicular, and axillary nodes normal. No abnormal inguinal nodes palpated Neurologic: Grossly normal   Pelvic: External genitalia:  no lesions              Urethra:  normal appearing urethra with no masses, tenderness or lesions              Bartholins and Skenes: normal                 Vagina: normal appearing vagina with normal color and no discharge, no lesions              Cervix: absent              Pap taken: No. Bimanual Exam:  Uterus:  uterus absent              Adnexa: no mass, fullness, tenderness               Rectovaginal: Confirms               Anus:  normal sphincter tone, no lesions  Chaperone, Octaviano Batty, CMA, was present for  exam.  Assessment/Plan: 1. Well woman exam with routine gynecological exam - Pap smear not indicated - Mammogram is due.  Pt is going to schedule.  Discussed today. - Colonoscopy 2019.  Follow up 10 years - Bone mineral density 08/2021 - lab work done with PCP, Dr. Quay Burow - vaccines reviewed/updated.  We discuss pneumonia and RSV vaccination.  2. Postmenopausal - NONFORMULARY OR COMPOUNDED ITEM; Estradiol cream 0.02% with testosterone 1% cream, Apply 1/2 ml vaginally as directed.  #45 ml grams  Dispense: 1 each; Refill: 1  3. H/O: hysterectomy

## 2022-08-17 ENCOUNTER — Other Ambulatory Visit (HOSPITAL_COMMUNITY): Payer: Self-pay

## 2022-08-17 ENCOUNTER — Other Ambulatory Visit: Payer: Self-pay

## 2022-08-17 ENCOUNTER — Other Ambulatory Visit: Payer: Self-pay | Admitting: Internal Medicine

## 2022-08-17 MED ORDER — OZEMPIC (0.25 OR 0.5 MG/DOSE) 2 MG/3ML ~~LOC~~ SOPN
0.5000 mg | PEN_INJECTOR | SUBCUTANEOUS | 0 refills | Status: DC
  Filled 2022-08-17: qty 3, 28d supply, fill #0

## 2022-08-17 MED ORDER — TRIAMCINOLONE ACETONIDE 0.1 % EX CREA
1.0000 | TOPICAL_CREAM | Freq: Two times a day (BID) | CUTANEOUS | 2 refills | Status: DC
  Filled 2022-08-17: qty 30, 15d supply, fill #0

## 2022-08-17 MED ORDER — DULOXETINE HCL 60 MG PO CPEP
60.0000 mg | ORAL_CAPSULE | Freq: Every day | ORAL | 1 refills | Status: DC
  Filled 2022-08-17: qty 90, 90d supply, fill #0

## 2022-10-05 ENCOUNTER — Other Ambulatory Visit: Payer: Self-pay | Admitting: Internal Medicine

## 2022-10-05 ENCOUNTER — Other Ambulatory Visit (HOSPITAL_COMMUNITY): Payer: Self-pay

## 2022-10-05 MED ORDER — OZEMPIC (0.25 OR 0.5 MG/DOSE) 2 MG/3ML ~~LOC~~ SOPN
0.5000 mg | PEN_INJECTOR | SUBCUTANEOUS | 0 refills | Status: DC
  Filled 2022-10-05: qty 3, 28d supply, fill #0

## 2022-10-17 DIAGNOSIS — Z1231 Encounter for screening mammogram for malignant neoplasm of breast: Secondary | ICD-10-CM | POA: Diagnosis not present

## 2022-12-07 ENCOUNTER — Encounter (HOSPITAL_BASED_OUTPATIENT_CLINIC_OR_DEPARTMENT_OTHER): Payer: Self-pay | Admitting: *Deleted

## 2023-08-23 ENCOUNTER — Ambulatory Visit (HOSPITAL_BASED_OUTPATIENT_CLINIC_OR_DEPARTMENT_OTHER): Payer: 59 | Admitting: Obstetrics & Gynecology

## 2023-09-10 ENCOUNTER — Encounter (HOSPITAL_BASED_OUTPATIENT_CLINIC_OR_DEPARTMENT_OTHER): Payer: Self-pay | Admitting: Obstetrics & Gynecology

## 2023-09-11 ENCOUNTER — Ambulatory Visit (HOSPITAL_BASED_OUTPATIENT_CLINIC_OR_DEPARTMENT_OTHER): Payer: 59 | Admitting: Obstetrics & Gynecology

## 2023-09-11 ENCOUNTER — Other Ambulatory Visit (HOSPITAL_BASED_OUTPATIENT_CLINIC_OR_DEPARTMENT_OTHER): Payer: Self-pay | Admitting: Obstetrics & Gynecology

## 2023-09-11 DIAGNOSIS — Z780 Asymptomatic menopausal state: Secondary | ICD-10-CM

## 2023-09-11 MED ORDER — NONFORMULARY OR COMPOUNDED ITEM: 1 refills | Status: DC

## 2024-03-08 ENCOUNTER — Other Ambulatory Visit (HOSPITAL_BASED_OUTPATIENT_CLINIC_OR_DEPARTMENT_OTHER): Payer: Self-pay

## 2024-06-11 ENCOUNTER — Encounter (HOSPITAL_BASED_OUTPATIENT_CLINIC_OR_DEPARTMENT_OTHER): Payer: Self-pay | Admitting: Obstetrics & Gynecology

## 2024-06-12 ENCOUNTER — Other Ambulatory Visit (HOSPITAL_BASED_OUTPATIENT_CLINIC_OR_DEPARTMENT_OTHER): Payer: Self-pay

## 2024-06-12 DIAGNOSIS — Z780 Asymptomatic menopausal state: Secondary | ICD-10-CM

## 2024-06-12 MED ORDER — NONFORMULARY OR COMPOUNDED ITEM: 1 refills | Status: DC

## 2024-06-12 NOTE — Progress Notes (Signed)
 Spoke with Deane at [facility] to verify a refill for Estradiol  cream with testosterone . Verbal order for Estradiol  cream 0.02% with testosterone  1% cream, Apply 1/2 mL vaginally as directed. #45 ml grams #1 1RF.

## 2024-06-17 ENCOUNTER — Encounter (HOSPITAL_BASED_OUTPATIENT_CLINIC_OR_DEPARTMENT_OTHER): Payer: Self-pay

## 2024-07-21 ENCOUNTER — Ambulatory Visit (HOSPITAL_BASED_OUTPATIENT_CLINIC_OR_DEPARTMENT_OTHER): Payer: Self-pay | Admitting: Obstetrics & Gynecology

## 2024-07-21 ENCOUNTER — Encounter (HOSPITAL_BASED_OUTPATIENT_CLINIC_OR_DEPARTMENT_OTHER): Payer: Self-pay | Admitting: Obstetrics & Gynecology

## 2024-07-21 VITALS — BP 127/89 | HR 93 | Ht 60.0 in | Wt 167.0 lb

## 2024-07-21 DIAGNOSIS — Z1331 Encounter for screening for depression: Secondary | ICD-10-CM

## 2024-07-21 DIAGNOSIS — E7849 Other hyperlipidemia: Secondary | ICD-10-CM | POA: Diagnosis not present

## 2024-07-21 DIAGNOSIS — E119 Type 2 diabetes mellitus without complications: Secondary | ICD-10-CM

## 2024-07-21 DIAGNOSIS — Z01419 Encounter for gynecological examination (general) (routine) without abnormal findings: Secondary | ICD-10-CM

## 2024-07-21 DIAGNOSIS — Z90710 Acquired absence of both cervix and uterus: Secondary | ICD-10-CM

## 2024-07-21 DIAGNOSIS — Z780 Asymptomatic menopausal state: Secondary | ICD-10-CM

## 2024-07-21 MED ORDER — NONFORMULARY OR COMPOUNDED ITEM: 1 refills | Status: AC

## 2024-07-21 NOTE — Progress Notes (Signed)
 "  ANNUAL EXAM Patient name: Sarah Mullen MRN [id number]  Date of birth: [birth year] Chief Complaint:   Gynecologic Exam  History of Present Illness:   Sarah Mullen is a 68 y.o. G80P1 Caucasian female being seen today for a routine annual exam.  Denies vaginal bleeding.    Still working full time.    Patient's last menstrual period was 09/24/2011.  Last pap: Hysterectomy. H/O abnormal pap: no Last mammogram: 10/17/2022. Results were: normal. Patient has not had mammogram done this year but is going to call to schedule. Family h/o breast cancer: no Last colonoscopy: 07/16/2018. Results were: normal. Family h/o colorectal cancer: yes paternal aunt.  Follow up 10 years.   Dexa: 2023.  Normal.        07/21/2024    2:23 PM 08/11/2022    8:21 AM 01/17/2022    9:16 AM 08/04/2021    3:10 PM 07/12/2021    1:15 PM  Depression screen PHQ 2/9  Decreased Interest 0 0 0 0 0  Down, Depressed, Hopeless 0 0 0 0 0  PHQ - 2 Score 0 0 0 0 0  Altered sleeping   0  1  Tired, decreased energy   1  1  Change in appetite   0  1  Feeling bad or failure about yourself    0  0  Trouble concentrating   0  0  Moving slowly or fidgety/restless   0  0  Suicidal thoughts   0  0  PHQ-9 Score   1   3   Difficult doing work/chores     Not difficult at all     Data saved with a previous flowsheet row definition        07/12/2021    1:15 PM 03/17/2019    8:49 AM  GAD 7 : Generalized Anxiety Score  Nervous, Anxious, on Edge 0 0  Control/stop worrying 0 0  Worry too much - different things 0 0  Trouble relaxing 0 0  Restless 0 0  Easily annoyed or irritable 0 0  Afraid - awful might happen 0 0  Total GAD 7 Score 0 0     Review of Systems:   Pertinent items are noted in HPI Denies any urinary or bowel changes.  Denies pelvic pain.   Pertinent History Reviewed:  Reviewed past medical,surgical, social and family history.  Reviewed problem list, medications and allergies. Physical Assessment:   Vitals:    07/21/24 1419  BP: 127/89  Pulse: 93  SpO2: 98%  Weight: 167 lb (75.8 kg)  Height: 5' (1.524 m)  Body mass index is 32.61 kg/m.        Physical Examination:   General appearance - well appearing, and in no distress  Mental status - alert, oriented to person, place, and time  Psych:  She has a normal mood and affect  Skin - warm and dry, normal color, no suspicious lesions noted  Chest - effort normal, all lung fields clear to auscultation bilaterally  Heart - normal rate and regular rhythm  Neck:  midline trachea, no thyromegaly or nodules  Breasts - breasts appear normal, no suspicious masses, no skin or nipple changes or  axillary nodes  Abdomen - soft, nontender, nondistended, no masses or organomegaly  Pelvic - VULVA: normal appearing vulva with no masses, tenderness or lesions   VAGINA: normal appearing vagina with normal color and discharge, no lesions   CERVIX: surgically absent  Thin prep pap is not  indicated  UTERUS: surgically absent  ADNEXA: No adnexal masses or tenderness noted.  Rectal - normal rectal, good sphincter tone, no masses felt. H  Extremities:  No swelling or varicosities noted  Chaperone present for exam  No results found for this or any previous visit (from the past 24 hours).  Assessment & Plan:  1. Well woman exam with routine gynecological exam (Primary) - Pap smear not indicated - Mammogram 2023.  Pt aware this is due.   - Colonoscopy 15/2019.  Follow up 10 years. - Bone mineral density 2023.   - lab work done with PCP - vaccines reviewed/updated   2. Postmenopausal - NONFORMULARY OR COMPOUNDED ITEM; Estradiol  cream 0.02% with testosterone  1% cream, Apply 1/2 ml vaginally as directed.  #45 ml grams  Dispense: 1 each; Refill: 1  3. Type 2 diabetes mellitus without complication, without long-term current use of insulin (HCC) - not on any medication.  Last HbA1C and 5.9.  4. Other hyperlipidemia  5. H/O: hysterectomy   No orders of the  defined types were placed in this encounter.   Meds:  Meds ordered this encounter  Medications   NONFORMULARY OR COMPOUNDED ITEM    Sig: Estradiol  cream 0.02% with testosterone  1% cream, Apply 1/2 ml vaginally as directed.  #45 ml grams    Dispense:  1 each    Refill:  1    Follow-up: Return in about 1 year (around 07/21/2025).  Ronal GORMAN Pinal, MD 07/21/2024 3:35 PM "
